# Patient Record
Sex: Female | Born: 1980
Health system: Southern US, Community
[De-identification: ages and names within clinical notes are randomized; demographics above are authoritative.]

## PROBLEM LIST (undated history)

## (undated) DIAGNOSIS — E785 Hyperlipidemia, unspecified: Secondary | ICD-10-CM

## (undated) DIAGNOSIS — E7800 Pure hypercholesterolemia, unspecified: Secondary | ICD-10-CM

## (undated) DIAGNOSIS — K5790 Diverticulosis of intestine, part unspecified, without perforation or abscess without bleeding: Secondary | ICD-10-CM

## (undated) DIAGNOSIS — E282 Polycystic ovarian syndrome: Secondary | ICD-10-CM

## (undated) DIAGNOSIS — E7889 Other lipoprotein metabolism disorders: Secondary | ICD-10-CM

## (undated) DIAGNOSIS — R0789 Other chest pain: Secondary | ICD-10-CM

## (undated) DIAGNOSIS — E669 Obesity, unspecified: Secondary | ICD-10-CM

## (undated) DIAGNOSIS — K7460 Unspecified cirrhosis of liver: Secondary | ICD-10-CM

## (undated) DIAGNOSIS — Z9889 Other specified postprocedural states: Secondary | ICD-10-CM

## (undated) DIAGNOSIS — F4310 Post-traumatic stress disorder, unspecified: Secondary | ICD-10-CM

## (undated) DIAGNOSIS — B182 Chronic viral hepatitis C: Secondary | ICD-10-CM

## (undated) DIAGNOSIS — B1920 Unspecified viral hepatitis C without hepatic coma: Secondary | ICD-10-CM

## (undated) DIAGNOSIS — E8889 Other specified metabolic disorders: Secondary | ICD-10-CM

## (undated) DIAGNOSIS — K219 Gastro-esophageal reflux disease without esophagitis: Secondary | ICD-10-CM

## (undated) HISTORY — PX: TONSILLECTOMY: SUR1361

## (undated) HISTORY — PX: BACK SURGERY: SHX140

## (undated) HISTORY — DX: Other chest pain: R07.89

## (undated) HISTORY — DX: Polycystic ovarian syndrome: E28.2

## (undated) HISTORY — DX: Post-traumatic stress disorder, unspecified: F43.10

## (undated) HISTORY — DX: Other specified metabolic disorders: E88.89

## (undated) HISTORY — DX: Unspecified viral hepatitis C without hepatic coma: B19.20

## (undated) HISTORY — DX: Unspecified cirrhosis of liver: K74.60

## (undated) HISTORY — DX: Chronic viral hepatitis C: B18.2

## (undated) HISTORY — DX: Obesity, unspecified: E66.9

## (undated) HISTORY — PX: HAND SURGERY: SHX662

## (undated) HISTORY — PX: FRACTURE SURGERY: SHX138

## (undated) HISTORY — PX: KNEE ARTHROSCOPY: SHX127

## (undated) HISTORY — PX: LIVER BIOPSY: SHX301

## (undated) HISTORY — DX: Other lipoprotein metabolism disorders: E78.89

## (undated) HISTORY — DX: Hyperlipidemia, unspecified: E78.5

---
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
## 2006-05-10 ENCOUNTER — Emergency Department (HOSPITAL_COMMUNITY): Admission: EM | Admit: 2006-05-10 | Discharge: 2006-05-10 | Payer: Self-pay | Admitting: Emergency Medicine

## 2006-08-11 ENCOUNTER — Emergency Department (HOSPITAL_COMMUNITY): Admission: EM | Admit: 2006-08-11 | Discharge: 2006-08-11 | Payer: Self-pay | Admitting: Emergency Medicine

## 2007-03-03 ENCOUNTER — Encounter: Payer: Self-pay | Admitting: Neurology

## 2007-03-03 ENCOUNTER — Ambulatory Visit: Payer: Self-pay | Admitting: Psychiatry

## 2007-03-03 ENCOUNTER — Inpatient Hospital Stay (HOSPITAL_COMMUNITY): Admission: AD | Admit: 2007-03-03 | Discharge: 2007-03-06 | Payer: Self-pay | Admitting: *Deleted

## 2007-03-03 IMAGING — CR DG CHEST 1V PORT
1 series · 1 of 1 positions shown · non-contrast
Comparison: none

CLINICAL DATA: Status epilepticus and cocaine abuse.
 CHEST PORTABLE - 1 VIEW ? [DATE]:

[view not recorded]
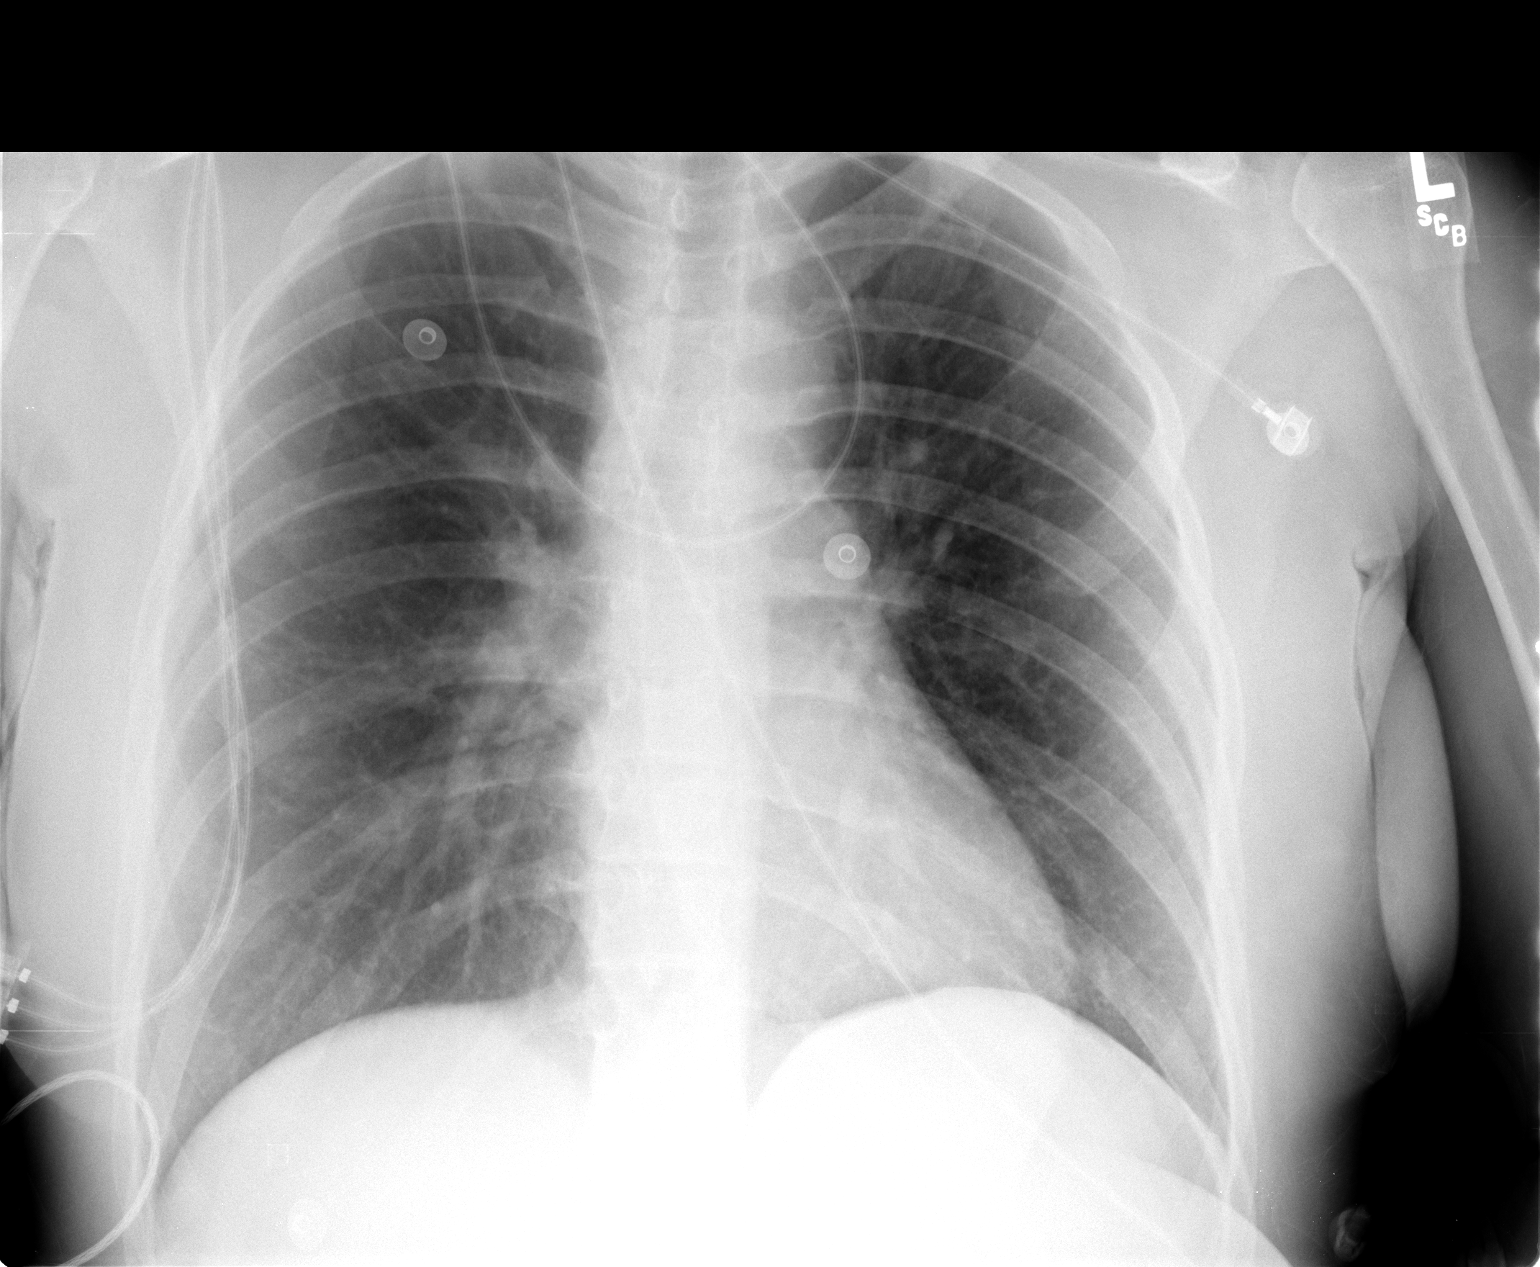

[1 of 1 positions shown; findings below may reference images not displayed]

FINDINGS: The heart and mediastinal contours are within normal limits.  Both lungs are well expanded and clear.  Negative for pneumothorax, focal air space opacity, effusion, or edema.
IMPRESSION: No evidence of acute cardiopulmonary disease.

## 2007-03-03 IMAGING — CT CT HEAD W/O CM
1 series · 16 of 30 positions shown, 20 images · IV contrast (agent unspecified)
Comparison: none

CLINICAL DATA: Seizure. 
 HEAD CT WITHOUT CONTRAST:
TECHNIQUE: Contiguous axial images were obtained from the base of the skull through the vertex according to standard protocol without contrast.

[Series 2: headseq 4.8 h45s · axial · 0.43mm/px · z∈[-162,-34]mm · 16 of 30 slices shown, 20 images]
[im 2/30  brain]
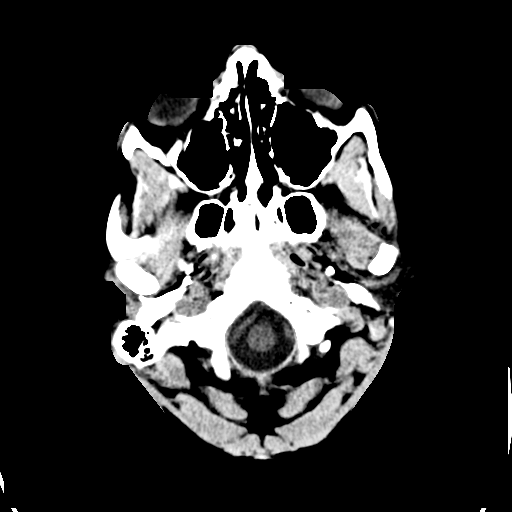
[im 2/30  bone]
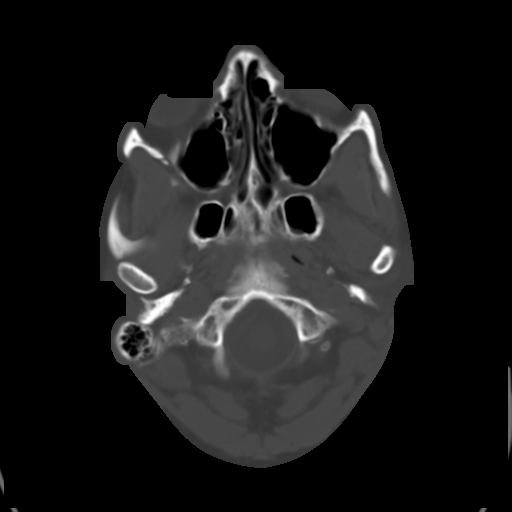
[im 4/30  brain]
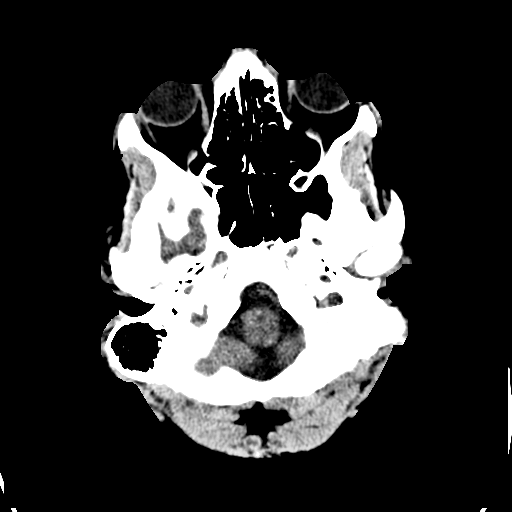
[im 6/30  brain]
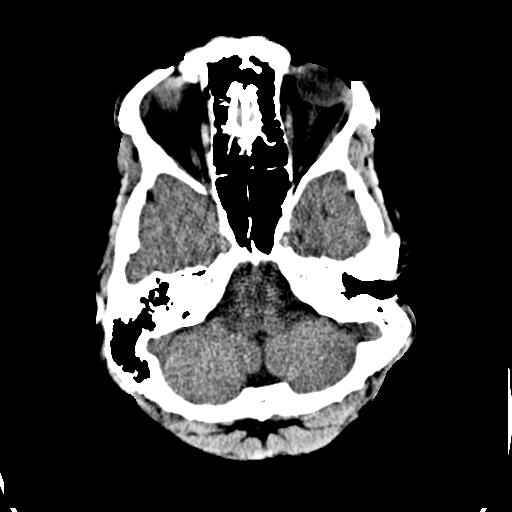
[im 8/30  brain]
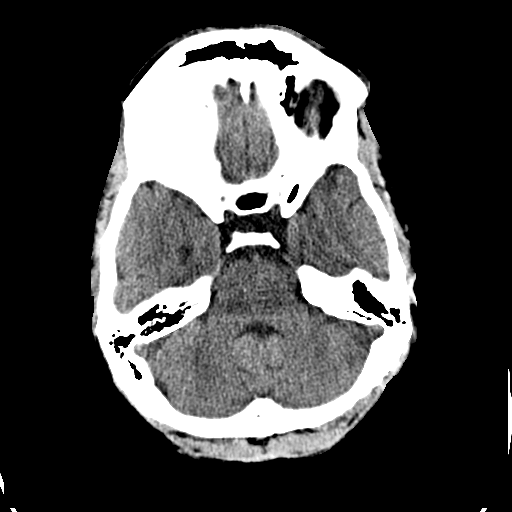
[im 9/30  brain]
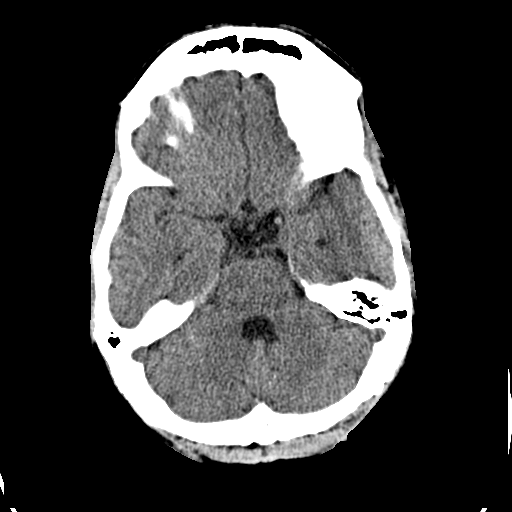
[im 9/30  bone]
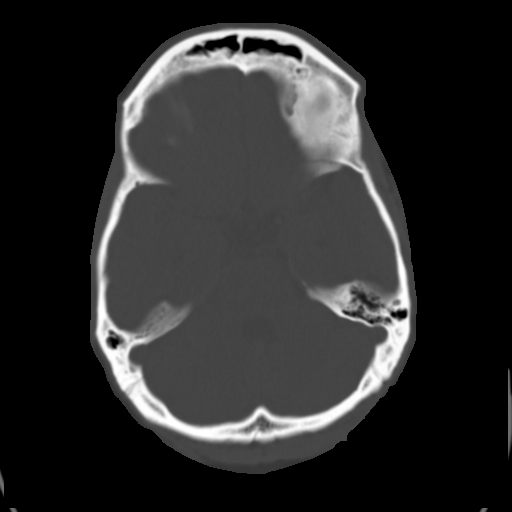
[im 11/30  brain]
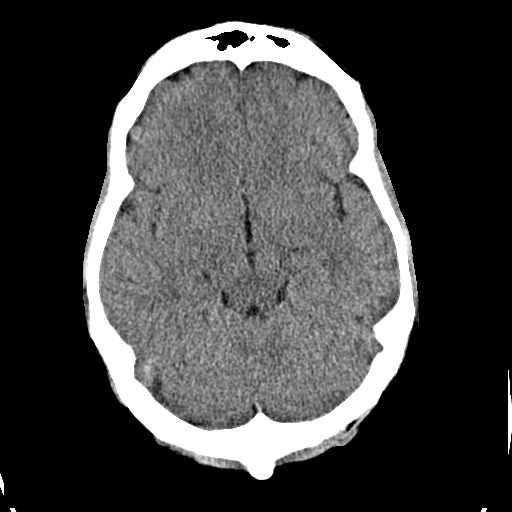
[im 13/30  brain]
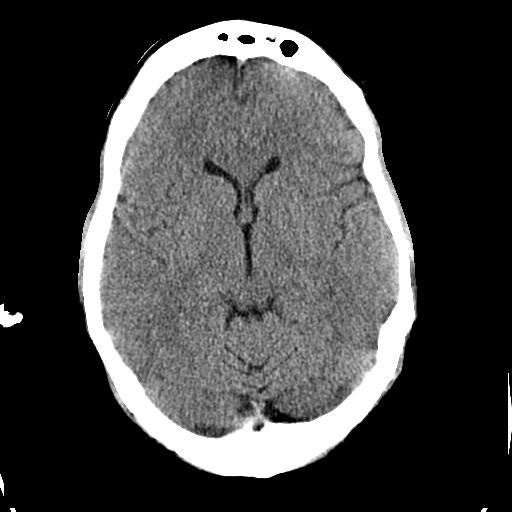
[im 15/30  brain]
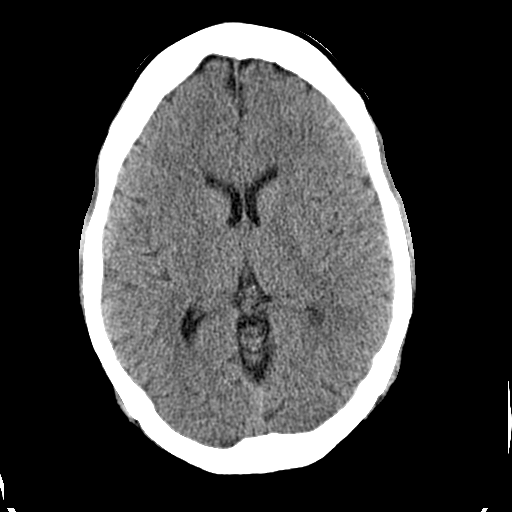
[im 16/30  brain]
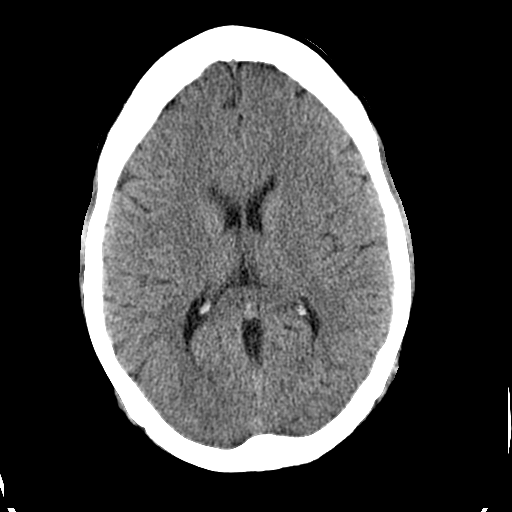
[im 16/30  bone]
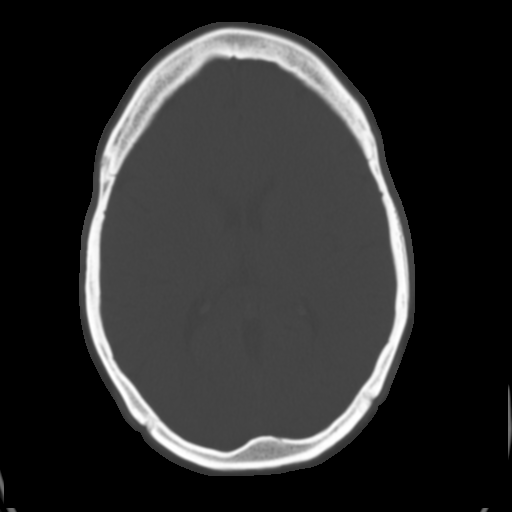
[im 18/30  brain]
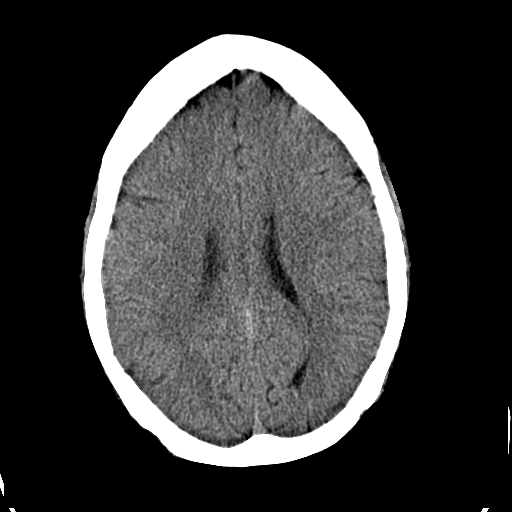
[im 20/30  brain]
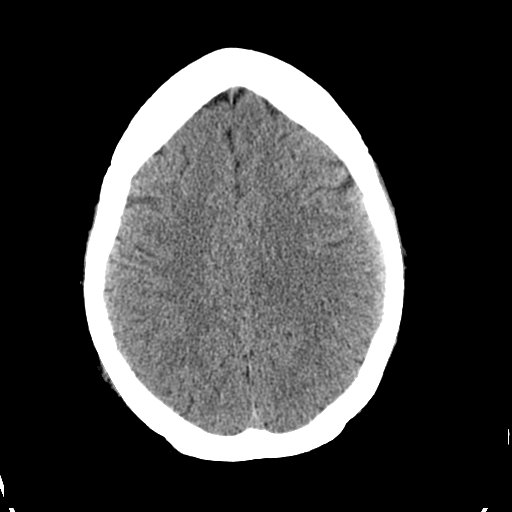
[im 22/30  brain]
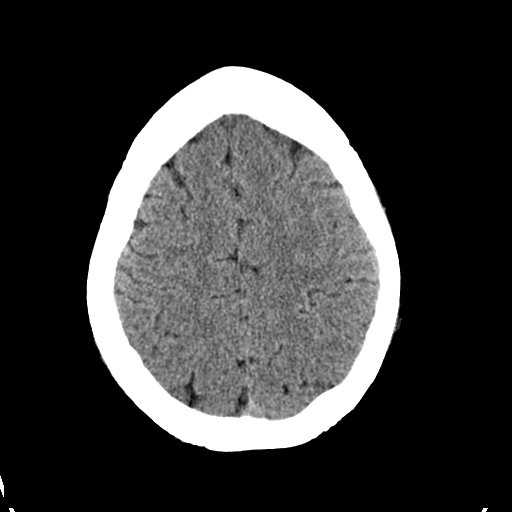
[im 23/30  brain]
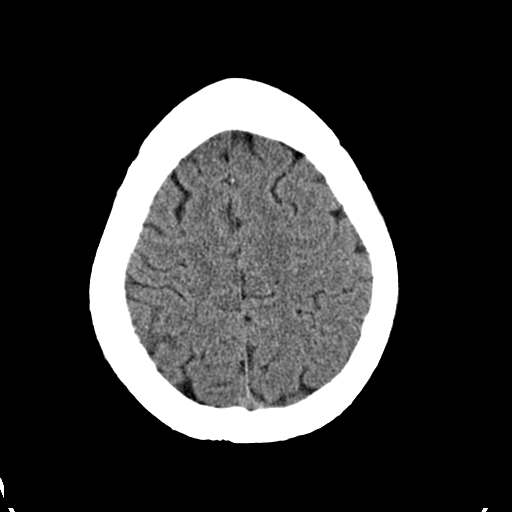
[im 23/30  bone]
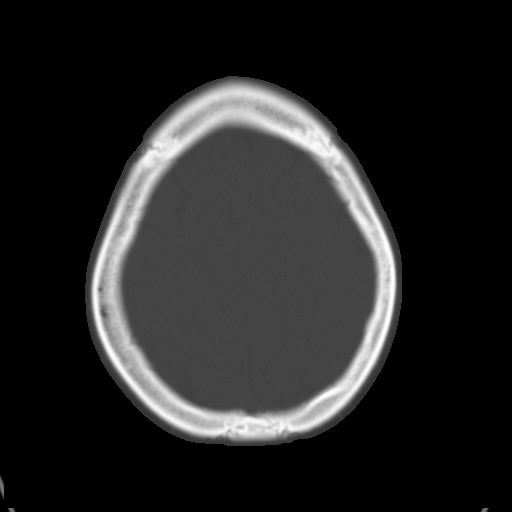
[im 25/30  brain]
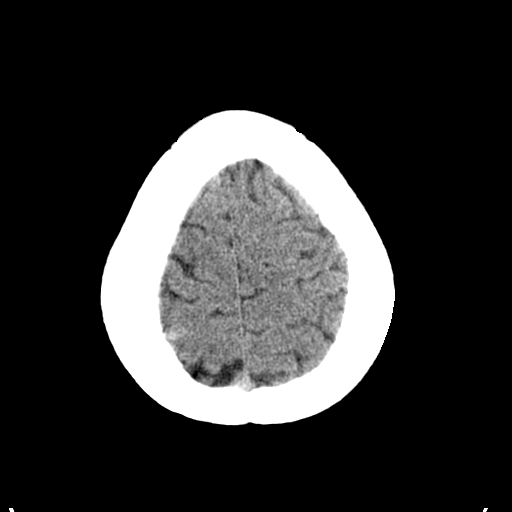
[im 27/30  brain]
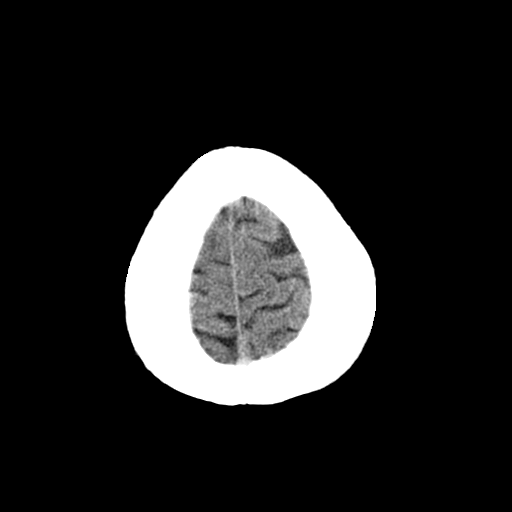
[im 29/30  brain]
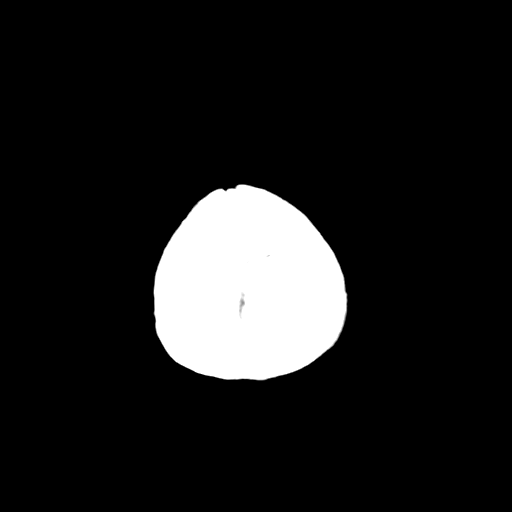

[16 of 30 positions shown; findings below may reference images not displayed]

FINDINGS: Streak artifact is seen across the brain stem. Allowing for this, there is no mass effect, midline shift, or acute intracranial hemorrhage. The ventricular system and extraaxial space are within normal limits. The visualized paranasal sinuses are clear. The cranium is intact.
IMPRESSION: No intracranial pathology.

## 2007-03-04 ENCOUNTER — Ambulatory Visit: Payer: Self-pay | Admitting: Infectious Diseases

## 2007-06-05 ENCOUNTER — Emergency Department (HOSPITAL_COMMUNITY): Admission: EM | Admit: 2007-06-05 | Discharge: 2007-06-05 | Payer: Self-pay | Admitting: Emergency Medicine

## 2007-12-14 ENCOUNTER — Emergency Department (HOSPITAL_COMMUNITY): Admission: EM | Admit: 2007-12-14 | Discharge: 2007-12-14 | Payer: Self-pay | Admitting: Emergency Medicine

## 2008-02-24 ENCOUNTER — Emergency Department (HOSPITAL_COMMUNITY): Admission: EM | Admit: 2008-02-24 | Discharge: 2008-02-24 | Payer: Self-pay | Admitting: Emergency Medicine
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
## 2008-03-07 ENCOUNTER — Emergency Department (HOSPITAL_COMMUNITY): Admission: EM | Admit: 2008-03-07 | Discharge: 2008-03-07 | Payer: Self-pay | Admitting: Emergency Medicine
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
## 2008-06-18 ENCOUNTER — Ambulatory Visit (HOSPITAL_BASED_OUTPATIENT_CLINIC_OR_DEPARTMENT_OTHER): Admission: RE | Admit: 2008-06-18 | Discharge: 2008-06-18 | Payer: Self-pay | Admitting: General Surgery

## 2008-06-18 ENCOUNTER — Encounter (INDEPENDENT_AMBULATORY_CARE_PROVIDER_SITE_OTHER): Payer: Self-pay | Admitting: General Surgery

## 2008-06-20 ENCOUNTER — Emergency Department (HOSPITAL_COMMUNITY): Admission: EM | Admit: 2008-06-20 | Discharge: 2008-06-21 | Payer: Self-pay | Admitting: Emergency Medicine

## 2010-06-06 LAB — BASIC METABOLIC PANEL
BUN: 12 mg/dL (ref 6–23)
Creatinine, Ser: 1.09 mg/dL (ref 0.4–1.2)
GFR calc non Af Amer: 60 mL/min — ABNORMAL LOW (ref >60)

## 2010-06-06 LAB — CBC
HCT: 43.5 % (ref 36.0–46.0)
Hemoglobin: 14.7 g/dL (ref 12.0–15.0)
MCV: 100 fL (ref 78.0–100.0)
Platelets: 235 10*3/uL (ref 150–400)
Platelets: 288 10*3/uL (ref 150–400)
RDW: 13 % (ref 11.5–15.5)
RDW: 13.3 % (ref 11.5–15.5)
WBC: 8 10*3/uL (ref 4.0–10.5)

## 2010-06-06 LAB — URINALYSIS, ROUTINE W REFLEX MICROSCOPIC
Bilirubin Urine: NEGATIVE
Ketones, ur: 15 mg/dL — AB
Nitrite: NEGATIVE
Specific Gravity, Urine: >1.046 — ABNORMAL HIGH (ref 1.005–1.030)
Urobilinogen, UA: 1 mg/dL (ref 0.0–1.0)
pH: 6 (ref 5.0–8.0)

## 2010-06-06 LAB — DIFFERENTIAL
Basophils Absolute: 0 10*3/uL (ref 0.0–0.1)
Basophils Absolute: 0.1 10*3/uL (ref 0.0–0.1)
Eosinophils Absolute: 0.4 10*3/uL (ref 0.0–0.7)
Eosinophils Relative: 7 % — ABNORMAL HIGH (ref 0–5)
Lymphocytes Relative: 24 % (ref 12–46)
Lymphocytes Relative: 25 % (ref 12–46)
Lymphs Abs: 2 10*3/uL (ref 0.7–4.0)
Monocytes Absolute: 0.7 10*3/uL (ref 0.1–1.0)
Neutro Abs: 4.4 10*3/uL (ref 1.7–7.7)
Neutrophils Relative %: 55 % (ref 43–77)

## 2010-06-06 LAB — WET PREP, GENITAL
Trich, Wet Prep: NONE SEEN
Yeast Wet Prep HPF POC: NONE SEEN

## 2010-06-06 LAB — HCG, QUANTITATIVE, PREGNANCY

## 2010-06-06 LAB — POCT PREGNANCY, URINE

## 2010-07-05 NOTE — Consult Note (Signed)
NAMEJASHLEY, Megan [name]             ACCOUNT NO.:  [id number]   MEDICAL RECORD NO.:  [id number]          PATIENT TYPE:  INP   LOCATION:  3020                         FACILITY:  [facility]   PHYSICIAN:  Antonietta Breach, M.D.  DATE OF BIRTH:  1980-03-14   DATE OF CONSULTATION:  03/06/2007  DATE OF DISCHARGE:  03/06/2007                                 CONSULTATION   Megan [name] is alert and oriented today.  She is cooperative with staff and  bedside healthcare.  She is socially appropriate.  She has no thoughts  of harming herself or others.  She has no hallucinations or delusions.   She has intact constructive future plans and interests.  Her energy is  within normal limits.  She is not having any racing thoughts are  decreased need for sleep.   Her CBC and liver function panel are unremarkable.   MENTAL STATUS EXAM:  As above.  Megan [name] is alert.  Her judgment is  intact.  Her insight is intact.  Thought process is logical, coherent,  goal-directed.  No looseness of associations.  Speech is within normal  limits.  She has good eye contact.  She is socially appropriate.   The patient confirms a past history of bipolar disorder.  She has had  episodes lasting over 4 days that have involved decreased need for  sleep, increased energy, high mood and grandiosity.   ASSESSMENT:  AXIS I:  296.80 bipolar disorder not otherwise specified,  stable.  Rule out somatoform disorder not otherwise specified.  Polysubstance dependence.   The undersigned provided ego supportive psychotherapy and education.  The indications, alternatives and adverse effects of Depakote were  discussed with the patient, including the risks of lethal blood and  liver effects.  The patient understands, would like to continue with  Depakote as her primary mood stabilizer.   RECOMMENDATIONS:  1. Depakote 1000 mg extended release one p.o. at bedtime, dispense #7      with one refill.  The patient will have follow-up with  her      psychiatrist in less than 2 weeks.  She also had will have follow-      up with Neurology.  2. Check a CBC and liver function panel within the week.  3. Follow-up with her alcohol drug services and 12-step groups.   The patient agrees to call emergency services immediately for thoughts  of harming herself, thoughts of harming others, distress, racing  thoughts.      Antonietta Breach, M.D.  Electronically Signed    JW/MEDQ  D:  03/07/2007  T:  03/07/2007  Job:  [id number]

## 2010-07-05 NOTE — Op Note (Signed)
[name], [name]                ACCOUNT NO.:  [id number]   MEDICAL RECORD NO.:  [id number]          PATIENT TYPE:  AMB   LOCATION:  DSC                          FACILITY:  [facility]   PHYSICIAN:  Gabrielle Dare. Janee Morn, M.D.DATE OF BIRTH:  07-08-1980   DATE OF PROCEDURE:  06/18/2008  DATE OF DISCHARGE:                               OPERATIVE REPORT   PREOPERATIVE DIAGNOSIS:  Mass left upper inner thigh.   POSTOPERATIVE DIAGNOSIS:  Mass left upper inner thigh.   PROCEDURE:  Excision of mass left upper inner thigh 2 cm.   SURGEON:  Gabrielle Dare. Janee Morn, MD   ANESTHESIA:  General with laryngeal mask airway   HISTORY OF PRESENT ILLNESS:  [name] is a 30 year old white female who  I evaluated in the office for an increasingly symptomatic mass on her  left upper inner thigh and becoming more painful especially with certain  types of clothing.  She presents today for elective excision.   PROCEDURE IN DETAIL:  Informed consent was obtained.  The patient was  identified and her site was marked.  She received intravenous  antibiotics.  She was brought to the operating room.  General anesthesia  with laryngeal mask airway was administered by the anesthesia staff.  Her left upper inner thigh was prepped and draped in a sterile fashion.  Marcaine 0.25% with epinephrine was injected around the palpable mass.  A transverse incision was made.  Subcutaneous tissues were dissected  down revealing this to be a fatty tumor.  It was circumferentially  dissected and removed and sent to pathology.  Meticulous hemostasis was  obtained in the wound.  The wound was irrigated.  There was no bleeding  in the wound.  The skin was then closed with a running 4-0 Monocryl  subcuticular stitch.  Please note that due to the redundancy of the  skin, an ellipse of skin was taken out along with the mass.  Dermabond  was placed.  Sponge, needle and instrument counts were all correct.  The  patient tolerated the procedure  well without apparent complication and  was taken to recovery room in stable condition.      Gabrielle Dare Janee Morn, M.D.  Electronically Signed     BET/MEDQ  D:  06/18/2008  T:  06/18/2008  Job:  [id number]   cc:   Aida Puffer
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
## 2010-07-05 NOTE — Discharge Summary (Signed)
Megan [name], Megan [name]             ACCOUNT NO.:  [id number]   MEDICAL RECORD NO.:  [id number]          PATIENT TYPE:  INP   LOCATION:  3020                         FACILITY:  [facility]   PHYSICIAN:  Bevelyn Buckles. Champey, M.D.DATE OF BIRTH:  Dec 03, 1980   DATE OF ADMISSION:  03/03/2007  DATE OF DISCHARGE:  03/06/2007                               DISCHARGE SUMMARY   REASON FOR ADMISSION:  Spells.   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   SECONDARY DIAGNOSES:  1. Bipolar disorder.  2. Hiatal hernia.  3. Methicillin-resistant Staphylococcus aureus skin infection.   DISCHARGE MEDICATIONS:  Depakote ER 1000 mg p.o. nightly.   HOSPITAL COURSE:  [name] is a 30 year old Caucasian female  who was  admitted  on March 03, 2007.  Please see history and physical as per  Dr. Scherrie November  on March 03, 2007.  The patient was admitted for  monitoring of spells that were thought to be non-epileptic in nature;  however, question was that it could have been electrographic seizures or  cocaine-induced seizures, as the patient had a positive urine drug  screen with cocaine on admission.  The patient was admitted for  continuous EEG monitoring requiring capture spells.  The patient also  had a questionable MRSA skin infection and was continued on vancomycin  as per medicine consult.  Psychiatry and Infectious Disease were also  consulted secondary to patient's noncompliant bipolar disorder and  questionable MRSA infection.  The patient subsequently was monitored  with continuous EEG for two days where she had several events on day 2.  These events, please see description on EEG dictation.  These events  were described as non-epileptic in character with no evidence of  epileptiform activity.  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  Infectious Disease said there is no need for vancomycin given the skin  infection  and vancomycin was stopped.  The patient was taken off  monitoring and was ready for discharge on March 06, 2007 at which time  she was stable.  Psychiatry initially thought possibly inpatient  psychiatry admission would be needed; however, on March 06, 2007, Dr.  Jeanie Sewer thought the patient was stable and judgment was within normal  limits, and she needs to follow up closely with her psychiatrist related  to her bipolar disorder and somatoform disorder.  She was discharged on  Depakote and was told to follow up with her psychiatrist.  The patient  was discharged home to follow up with her psychiatrist next week and on  discharge medication Depakote ER 1000 mg nightly.  She was discharged  home in stable condition to her mother and boyfriend.  She was told to  call with any questions or concerns.      Bevelyn Buckles. Nash Shearer, M.D.  Electronically Signed     DRC/MEDQ  D:  03/06/2007  T:  03/06/2007  Job:  [id number]

## 2010-07-05 NOTE — Procedures (Signed)
EEG NUMBER:  [id number]   HISTORY:  This is a 30 year old with seizure-like episodes who is having  continuous EEG to evaluate for epileptic versus nonepileptic events.  This recording occurred from March 03, 2006, to March 05, 2006.  Day  #1 of recording, there are no push button events noted.  The background  activity is fairly symmetric mostly comprised of alpha and theta range  activity at 10-30 microvolts.  There was occasional EMG movement and  electrode artifact noticed throughout this entire day's recording. There  is no definitive epileptiform activity.   Day #2 of recording, there are a total of five areas of push button  events.  The first push button event occurs from 6:55 p.m. to 6:56 p.m.  There are 19 push buttons during this episode.  The patient is  clinically lying on her right side and then starts with some vigorous  irregular jerking and twitching in her body. Electrographically, there  is no definitive epileptiform activity.  There does seem to some 60 Hz  artifact in the background that does obscures the background slightly.  The second push button event occurs at 7:43 p.m.  There are two push  button events associated with this event.  The patient is lying down in  bed. There is no definitive movement.  There is also no definitive  epileptiform activity noticed, and there is no background activity  noticed during this time frame. There is also 60 Hz artifact noticed  during this time frame.  The third and fourth push button events  occurred at 7:46 p.m. and 7:48 p.m. for a total of six push buttons  during this event.  The patient is lying down in bed, having slight  twitching, and her right hand is clenched. This is followed by vigorous  thrashing and irregular movements in her body and head. There is no  definitive change in the background, just a tremendous amount of EMG  movement artifact. There is 60 Hz artifact prior and post event with no  significant  change in the background other than EMG movement artifact.  The last push button event occurs at 9:26 p.m.  There are six push  buttons associated with this event.  The patient is lying down.  Her  head is to the left.  The patient opens her eyes, turns her head to the  right, and back and forth a few times; then the patient starts having  body twitching and thrashing with irregular movements in her entire body  and arching her back.  Throughout this event, there is no definitive  epileptiform activity, just a tremendous amount of EMG movement  artifact.  There is also 60 Hz artifact noticed in the background as  well.  The background of this recording is fairly symmetric, mostly  comprised of mixed frequency activity at 10-40 microvolts.  The patient  does seem to disconnect the machine at 6:08 p.m. prior to all her push  button events as she goes to the bathroom, and after this time is when  160 Hz artifact is seen in the background. There is also a tremendous  amount of EMG movement and electrode artifact noticed throughout the  recording as well.  Throughout this entire tracing, there is no  definitive epileptiform activity noticed.   IMPRESSION:  This continuous video EEG monitoring had several events on  day #2 of recording as described above.  There is no definitive  epileptiform activity noticed during this time frame.  These  events are  classified as nonepileptic in character.  Clinical correlation is  advised.      Bevelyn Buckles. Nash Shearer, M.D.  Electronically Signed     ZOX:WRUE  D:  03/06/2007 12:29:44  T:  03/06/2007 13:18:33  Job #:  [id number]
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
## 2010-07-05 NOTE — Consult Note (Signed)
Megan, [name] NO.:  [id number]   MEDICAL RECORD NO.:  [id number]          PATIENT TYPE:  EMS   LOCATION:  ED                           FACILITY:  [facility]   PHYSICIAN:  Altha Harm, MDDATE OF BIRTH:  Nov 20, 1980   DATE OF CONSULTATION:  03/03/2007  DATE OF DISCHARGE:                                 CONSULTATION   CHIEF COMPLAINT:  Seizure disorder.   HISTORY OF PRESENT ILLNESS:  This is a 30 year old Caucasian female who  was recently a patient at [facility] and left against medical  advice because her boyfriend could not stay with her in the ICU room.  The patient now presents to [facility] Emergency Room with  continued seizure activity.  The patient is unable to give me any  information regarding her seizures and her family members are not  available to give any further information.  However, the patient states  that she has had seizures since she has been in the emergency room.  They were not witnessed by any hospital personnel.   In addition to this, patient had an I&D of a small lesion on her  buttocks while over at [facility] and, according to her, the  aspirate was sent for culture.  The patient stated that she was being  treated for methicillin resistant Staphylococcus aureus.  However, the  I&D was done only less than 24 hours ago, thus cultures are not  available at this time.   The patient states that she has no other medical problems and she is not  under the care of any other physicians.  On the emergency room record,  it is listed the patient has bipolar disorder in her past medical  history, however, the patient denies this to me.   REVIEW OF SYSTEMS:  Twelve systems reviewed.  All systems were negative  except as noted in the HPI.   Studies in the emergency room show the following.  Sodium 140, potassium  3.3, chloride 107, carbon dioxide 17, BUN 4, creatinine 1.01.  White  blood cell count 9.5,  hemoglobin 13.5, hematocrit 39, platelet count  305.  Alcohol level less than 5.  A urinalysis was done which shows no  laparoscopic elements consistent with a urinary tract infection.   PHYSICAL EXAMINATION:  VITAL SIGNS:  Temperature orally is 97.4,  rectally 99.1, blood pressure 116/44, heart rate 111, respiratory rate  14, O2 saturation 97% on room air.  GENERAL APPEARANCE:  The patient is laying in bed, alert and oriented  x3.  HEENT:  She is normocephalic and atraumatic.  Pupils are equal, round,  reactive to light and accommodation.  Extraocular movements are intact.  Tympanic membranes are translucent bilaterally.  Oropharynx is moist.  No exudate, erythema or lesions noted.  NECK:  Trachea is midline.  No masses, no thyromegaly, no JVD, no  carotid bruit.  RESPIRATORY:  Patient has a normal respiratory effort, equal excursion  bilaterally.  No rales or rhonchi noted.  CARDIOVASCULAR:  She has tachycardia, normal S1 and S2, no murmurs,  rubs, or gallops are noted.  PMI is  not displaced.  No heaves or thrills  on palpation.  ABDOMEN:  Abdomen is soft, obese, nontender, nondistended, no masses, no  hepatosplenomegaly noted.  LYMPH NODES:  She has no cervical, axillary or inguinal lymphadenopathy.  SKIN:  The patient has an area of post incision drainage with small  incision on the left buttocks almost at the cleft.  No exudate is  expressed from the incision at this time.  The patient also has some  small erythematous papular lesions diffusely over the skin.  NEUROLOGIC:  She has no focal neurologic deficits.  Cranial nerves II-  XII grossly intact.  DTRs are 2+ bilaterally in the upper and lower  extremities.  PSYCHIATRIC:  She is alert and oriented x3.  She has normal cognition,  insight appears to be somewhat compromised.  Good recent and remote  recall except for the periods when she has had seizures.   ASSESSMENT/PLAN:  This is a patient who presents primarily with a   complaint of seizure disorder.  New onset seizures.  The patient will  need neurological work-up.  Given the fact that this is primarily the  patient's problem, the patient should probably be admitted to the  neurology service.  Secondarily, her minor problem is she is being  treated for possible methicillin resistant Staphylococcus aureus.  The  investigation has already been completed with cultures pending at  [facility].  The patient should continue on vancomycin here until  the culture and sensitivities have been received and the therapy can be  narrowed.  If, in fact, there are no cultures at [facility], then  an infectious disease consult would be appropriate to  help guide type  and duration of therapy.  At this time, the patient does not require  admission to the medicine service and should be admitted to the  neurology service.  I spoke with Dr. Vickey Huger, who will see the patient  and will admit to the neurology service.      Altha Harm, MD  Electronically Signed     MAM/MEDQ  D:  03/03/2007  T:  03/03/2007  Job:  [id number]

## 2010-11-10 LAB — PHENYTOIN LEVEL, TOTAL
Phenytoin Lvl: 19.5
Phenytoin Lvl: 21 — ABNORMAL HIGH

## 2010-11-10 LAB — CBC
HCT: 39.7
Hemoglobin: 13.4
Hemoglobin: 13.5
MCHC: 33.7
MCHC: 34.2
MCHC: 34.5
MCV: 98.7
Platelets: 231
RBC: 3.95
RBC: 3.95
RDW: 13.7
RDW: 14
RDW: 14.1
WBC: 7.6

## 2010-11-10 LAB — URINALYSIS, ROUTINE W REFLEX MICROSCOPIC
Bilirubin Urine: NEGATIVE
Glucose, UA: NEGATIVE
Hgb urine dipstick: NEGATIVE
pH: 6.5

## 2010-11-10 LAB — BASIC METABOLIC PANEL
CO2: 17 — ABNORMAL LOW
Chloride: 107
Creatinine, Ser: 1.01
GFR calc Af Amer: >60
Glucose, Bld: 87
Sodium: 140

## 2010-11-10 LAB — HEPATIC FUNCTION PANEL
Albumin: 3 — ABNORMAL LOW
Indirect Bilirubin: 0.3
Total Bilirubin: 0.4
Total Protein: 6

## 2010-11-10 LAB — CULTURE, BLOOD (ROUTINE X 2)
Culture: NO GROWTH
Culture: NO GROWTH

## 2010-11-10 LAB — PHENYTOIN LEVEL, FREE AND TOTAL: Phenytoin Bound: 18.4

## 2010-11-10 LAB — RAPID URINE DRUG SCREEN, HOSP PERFORMED
Amphetamines: NOT DETECTED
Benzodiazepines: POSITIVE — AB
Cocaine: POSITIVE — AB
Opiates: NOT DETECTED

## 2010-11-10 LAB — COMPREHENSIVE METABOLIC PANEL
ALT: 9
AST: 15
Albumin: 2.7 — ABNORMAL LOW
Calcium: 8.1 — ABNORMAL LOW
GFR calc Af Amer: >60
Sodium: 140
Total Protein: 5.3 — ABNORMAL LOW

## 2010-11-10 LAB — DIFFERENTIAL
Basophils Relative: 1
Eosinophils Absolute: 0.2
Monocytes Absolute: 1
Monocytes Relative: 10

## 2010-11-10 LAB — URINE CULTURE

## 2010-11-10 LAB — URINE MICROSCOPIC-ADD ON

## 2010-11-10 LAB — MAGNESIUM: Magnesium: 2.3

## 2012-09-05 ENCOUNTER — Encounter (HOSPITAL_COMMUNITY): Payer: Self-pay | Admitting: Emergency Medicine

## 2012-09-05 ENCOUNTER — Emergency Department (HOSPITAL_COMMUNITY)
Admission: EM | Admit: 2012-09-05 | Discharge: 2012-09-06 | Disposition: A | Discharge status: Home / Self Care | Payer: Medicaid Other | Attending: Emergency Medicine | Admitting: Emergency Medicine

## 2012-09-05 DIAGNOSIS — Z3202 Encounter for pregnancy test, result negative: Secondary | ICD-10-CM | POA: Insufficient documentation

## 2012-09-05 DIAGNOSIS — F1010 Alcohol abuse, uncomplicated: Secondary | ICD-10-CM

## 2012-09-05 DIAGNOSIS — F17200 Nicotine dependence, unspecified, uncomplicated: Secondary | ICD-10-CM | POA: Insufficient documentation

## 2012-09-05 DIAGNOSIS — R55 Syncope and collapse: Secondary | ICD-10-CM | POA: Insufficient documentation

## 2012-09-05 DIAGNOSIS — Z8639 Personal history of other endocrine, nutritional and metabolic disease: Secondary | ICD-10-CM | POA: Insufficient documentation

## 2012-09-05 DIAGNOSIS — Y9389 Activity, other specified: Secondary | ICD-10-CM | POA: Insufficient documentation

## 2012-09-05 DIAGNOSIS — R1110 Vomiting, unspecified: Secondary | ICD-10-CM | POA: Insufficient documentation

## 2012-09-05 DIAGNOSIS — T401X1A Poisoning by heroin, accidental (unintentional), initial encounter: Secondary | ICD-10-CM

## 2012-09-05 DIAGNOSIS — Z880 Allergy status to penicillin: Secondary | ICD-10-CM | POA: Insufficient documentation

## 2012-09-05 DIAGNOSIS — Z79899 Other long term (current) drug therapy: Secondary | ICD-10-CM | POA: Insufficient documentation

## 2012-09-05 DIAGNOSIS — E669 Obesity, unspecified: Secondary | ICD-10-CM | POA: Insufficient documentation

## 2012-09-05 DIAGNOSIS — T401X4A Poisoning by heroin, undetermined, initial encounter: Secondary | ICD-10-CM | POA: Insufficient documentation

## 2012-09-05 DIAGNOSIS — Z862 Personal history of diseases of the blood and blood-forming organs and certain disorders involving the immune mechanism: Secondary | ICD-10-CM | POA: Insufficient documentation

## 2012-09-05 DIAGNOSIS — Y929 Unspecified place or not applicable: Secondary | ICD-10-CM | POA: Insufficient documentation

## 2012-09-05 HISTORY — DX: Pure hypercholesterolemia, unspecified: E78.00

## 2012-09-05 LAB — RAPID URINE DRUG SCREEN, HOSP PERFORMED
Amphetamines: NOT DETECTED
Benzodiazepines: NOT DETECTED
Cocaine: POSITIVE — AB
Opiates: NOT DETECTED
Tetrahydrocannabinol: NOT DETECTED

## 2012-09-05 LAB — COMPREHENSIVE METABOLIC PANEL
ALT: 82 U/L — ABNORMAL HIGH (ref 0–35)
AST: 262 U/L — ABNORMAL HIGH (ref 0–37)
Alkaline Phosphatase: 179 U/L — ABNORMAL HIGH (ref 39–117)
CO2: 17 mEq/L — ABNORMAL LOW (ref 19–32)
Calcium: 8.6 mg/dL (ref 8.4–10.5)
GFR calc Af Amer: >90 mL/min (ref >90)
Glucose, Bld: 114 mg/dL — ABNORMAL HIGH (ref 70–99)
Potassium: 3.7 mEq/L (ref 3.5–5.1)
Sodium: 138 mEq/L (ref 135–145)
Total Protein: 7.7 g/dL (ref 6.0–8.3)

## 2012-09-05 LAB — CBC
HCT: 43.3 % (ref 36.0–46.0)
MCH: 36.9 pg — ABNORMAL HIGH (ref 26.0–34.0)
MCHC: 34.6 g/dL (ref 30.0–36.0)
MCV: 106.7 fL — ABNORMAL HIGH (ref 78.0–100.0)
Platelets: 234 10*3/uL (ref 150–400)
RDW: 15.5 % (ref 11.5–15.5)
WBC: 10.7 10*3/uL — ABNORMAL HIGH (ref 4.0–10.5)

## 2012-09-05 LAB — DIFFERENTIAL
Basophils Absolute: 0 10*3/uL (ref 0.0–0.1)
Eosinophils Absolute: 0.2 10*3/uL (ref 0.0–0.7)
Eosinophils Relative: 2 % (ref 0–5)
Lymphocytes Relative: 6 % — ABNORMAL LOW (ref 12–46)
Monocytes Absolute: 0.7 10*3/uL (ref 0.1–1.0)

## 2012-09-05 LAB — URINE MICROSCOPIC-ADD ON

## 2012-09-05 LAB — URINALYSIS, ROUTINE W REFLEX MICROSCOPIC
Glucose, UA: NEGATIVE mg/dL
Hgb urine dipstick: NEGATIVE
Leukocytes, UA: NEGATIVE
Protein, ur: 30 mg/dL — AB
Specific Gravity, Urine: 1.02 (ref 1.005–1.030)
pH: 5 (ref 5.0–8.0)

## 2012-09-05 MED ORDER — ONDANSETRON HCL 4 MG PO TABS: 4.0000 mg | ORAL_TABLET | Freq: Three times a day (TID) | ORAL | Status: DC | PRN

## 2012-09-05 MED ORDER — LORAZEPAM 1 MG PO TABS: 0.0000 mg | ORAL_TABLET | Freq: Two times a day (BID) | ORAL | Status: DC

## 2012-09-05 MED ORDER — ONDANSETRON HCL 4 MG/2ML IJ SOLN
4.0000 mg | Freq: Once | INTRAMUSCULAR | Status: AC
  Administered 2012-09-05: 4 mg via INTRAVENOUS
  Filled 2012-09-05: qty 2

## 2012-09-05 MED ORDER — LORAZEPAM 1 MG PO TABS: 0.0000 mg | ORAL_TABLET | Freq: Four times a day (QID) | ORAL | Status: DC

## 2012-09-05 MED ORDER — ADULT MULTIVITAMIN W/MINERALS CH: 1.0000 | ORAL_TABLET | Freq: Every day | ORAL | Status: DC

## 2012-09-05 MED ORDER — FOLIC ACID 1 MG PO TABS: 1.0000 mg | ORAL_TABLET | Freq: Every day | ORAL | Status: DC

## 2012-09-05 MED ORDER — PANTOPRAZOLE SODIUM 40 MG PO TBEC: 80.0000 mg | DELAYED_RELEASE_TABLET | Freq: Every day | ORAL | Status: DC

## 2012-09-05 MED ORDER — VITAMIN B-1 100 MG PO TABS: 100.0000 mg | ORAL_TABLET | Freq: Every day | ORAL | Status: DC

## 2012-09-05 MED ORDER — THIAMINE HCL 100 MG/ML IJ SOLN: 100.0000 mg | Freq: Every day | INTRAMUSCULAR | Status: DC

## 2012-09-05 MED ORDER — SODIUM CHLORIDE 0.9 % IV BOLUS (SEPSIS)
1000.0000 mL | Freq: Once | INTRAVENOUS | Status: AC
  Administered 2012-09-05: 1000 mL via INTRAVENOUS

## 2012-09-05 NOTE — ED Notes (Signed)
ZOX:[id number]<AV> Expected date:09/05/12<BR> Expected time: 7:41 PM<BR> Means of arrival:Ambulance<BR> Comments:<BR> 32 yo F Heroine use, unresponsive, narcan given alert and oriented now

## 2012-09-05 NOTE — ED Provider Notes (Signed)
History    CSN: [id number] Arrival date & time 09/05/12  1951  First MD Initiated Contact with Patient 09/05/12 2004     Chief Complaint  Patient presents with  . Drug Overdose   (Consider location/radiation/quality/duration/timing/severity/associated sxs/prior Treatment) Patient is a 32 y.o. female presenting with Overdose. The history is provided by the patient.  Drug Overdose This is a new problem. Pertinent negatives include no chest pain, no abdominal pain, no headaches and no shortness of breath.   patient overdosed on heroin. 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 She states she's a daily heavy drinker. She states that she drank 6 or 7 vodka drinks today and was upset with her boyfriend so she used heroin with her neighbors. She states she just is a little bit. She was later found unresponsive.  she woke up with Narcan. She does not remember this.patient states she is not used opiates in ears. She states she does want help with her drinking too. She's now actively vomiting. She states she and her boyfriend drink of vodka and by a 0.5 gallon about every other day. Past Medical History  Diagnosis Date  . Hypercholesteremia    Past Surgical History  Procedure Laterality Date  . Tonsillectomy    . Hand surgery     No family history on file. History  Substance Use Topics  . Smoking status: Current Every Day Smoker -- 0.50 packs/day for 15 years    Types: Cigarettes  . Smokeless tobacco: Never Used  . Alcohol Use: Yes     Comment: 0.5 gallon every 2-3 days   OB History   Grav Para Term Preterm Abortions TAB SAB Ect Mult Living                 Review of Systems  Constitutional: Negative for activity change and appetite change.  HENT: Negative for neck stiffness.   Eyes: Negative for pain.  Respiratory: Negative for chest tightness and shortness of breath.   Cardiovascular: Negative for chest pain and leg swelling.  Gastrointestinal:  Positive for vomiting. Negative for nausea, abdominal pain and diarrhea.  Genitourinary: Negative for flank pain.  Musculoskeletal: Negative for back pain.  Skin: Negative for rash.  Neurological: Positive for syncope. Negative for weakness, numbness and headaches.  Psychiatric/Behavioral: Negative for suicidal ideas and behavioral problems.    Allergies  Penicillins  Home Medications   Current Outpatient Rx  Name  Route  Sig  Dispense  Refill  . omeprazole (PRILOSEC) 40 MG capsule   Oral   Take 40 mg by mouth every morning.          BP 107/77  Pulse 102  Temp(Src) 97.7 F (36.5 C) (Oral)  Resp 16  SpO2 99% Physical Exam  Nursing note and vitals reviewed. Constitutional: She is oriented to person, place, and time. She appears well-developed and well-nourished.  Patient is obese  HENT:  Head: Normocephalic and atraumatic.  Eyes: EOM are normal. Pupils are equal, round, and reactive to light.  Neck: Normal range of motion. Neck supple.  Cardiovascular: Normal rate, regular rhythm and normal heart sounds.   No murmur heard. Pulmonary/Chest: Effort normal and breath sounds normal. No respiratory distress. She has no wheezes. She has no rales.  Abdominal: Soft. Bowel sounds are normal. She exhibits no distension. There is no tenderness. There is no rebound and no guarding.  Patient is actively vomiting  Musculoskeletal: Normal range of motion.  Neurological: She  is alert and oriented to person, place, and time. No cranial nerve deficit.  Skin: Skin is warm and dry.  Psychiatric: She has a normal mood and affect. Her speech is normal.    ED Course  Procedures (including critical care time) Labs Reviewed  COMPREHENSIVE METABOLIC PANEL - Abnormal; Notable for the following:    CO2 17 (*)    Glucose, Bld 114 (*)    Albumin 3.4 (*)    AST 262 (*)    ALT 82 (*)    Alkaline Phosphatase 179 (*)    All other components within normal limits  ETHANOL - Abnormal; Notable for  the following:    Alcohol, Ethyl (B) 217 (*)    All other components within normal limits  URINALYSIS, ROUTINE W REFLEX MICROSCOPIC - Abnormal; Notable for the following:    APPearance CLOUDY (*)    Protein, ur 30 (*)    All other components within normal limits  URINE RAPID DRUG SCREEN (HOSP PERFORMED) - Abnormal; Notable for the following:    Cocaine POSITIVE (*)    All other components within normal limits  CBC - Abnormal; Notable for the following:    WBC 10.7 (*)    MCV 106.7 (*)    MCH 36.9 (*)    All other components within normal limits  DIFFERENTIAL - Abnormal; Notable for the following:    Neutrophils Relative % 85 (*)    Neutro Abs 9.1 (*)    Lymphocytes Relative 6 (*)    All other components within normal limits  URINE MICROSCOPIC-ADD ON - Abnormal; Notable for the following:    Squamous Epithelial / LPF MANY (*)    Bacteria, UA FEW (*)    Casts HYALINE CASTS (*)    All other components within normal limits  LIPASE, BLOOD  PREGNANCY, URINE  CBC WITH DIFFERENTIAL   No results found. 1. Accidental heroin overdose, initial encounter   2. Alcohol abuse     MDM  Patient with accidental heroin overdose that required Narcan. She also is a heavy alcohol user and would like treatment. She has cocaine in the urine, but she now states that she taken it. She was initially vomiting and had a bicarbonate of 17. She's been given Zofran and fluids and will be rechecked. After that she may be medically cleared will be seen by the ACT team.  Juliet Rude. Rubin Payor, MD 09/05/12 2350

## 2012-09-05 NOTE — ED Notes (Signed)
Per EMS: Pt found unresponsive on [employer] bathroom floor by mother. Apneic and cyanotic on EMS arrival at scene. Previous heroin problems. EMS bagged her. 20 G Left AC. Pt given 2 Narcan intranasally. After 5 mins assisted ventilations, pt came around and admitted to IV heroin drug use. 12 Lead normal. VS stable.

## 2012-09-06 NOTE — ED Notes (Signed)
Patient refused lab draw. Ward RN made aware.

## 2012-09-06 NOTE — ED Notes (Signed)
Spoke with patient and Dr. Patient requesting to be discharged. Patient states she feels fine.

## 2012-12-26 ENCOUNTER — Other Ambulatory Visit: Payer: Self-pay

## 2013-10-10 DIAGNOSIS — N12 Tubulo-interstitial nephritis, not specified as acute or chronic: Secondary | ICD-10-CM

## 2013-10-10 DIAGNOSIS — Z720 Tobacco use: Secondary | ICD-10-CM | POA: Insufficient documentation

## 2013-10-10 HISTORY — DX: Tobacco use: Z72.0

## 2013-10-10 HISTORY — DX: Tubulo-interstitial nephritis, not specified as acute or chronic: N12

## 2013-11-12 ENCOUNTER — Emergency Department (HOSPITAL_COMMUNITY)
Admission: EM | Admit: 2013-11-12 | Discharge: 2013-11-12 | Disposition: A | Discharge status: Home / Self Care | Payer: Medicaid Other | Attending: Emergency Medicine | Admitting: Emergency Medicine

## 2013-11-12 ENCOUNTER — Emergency Department (HOSPITAL_COMMUNITY): Payer: Medicaid Other

## 2013-11-12 ENCOUNTER — Encounter (HOSPITAL_COMMUNITY): Payer: Self-pay | Admitting: Emergency Medicine

## 2013-11-12 DIAGNOSIS — K219 Gastro-esophageal reflux disease without esophagitis: Secondary | ICD-10-CM | POA: Insufficient documentation

## 2013-11-12 DIAGNOSIS — S51809A Unspecified open wound of unspecified forearm, initial encounter: Secondary | ICD-10-CM | POA: Insufficient documentation

## 2013-11-12 DIAGNOSIS — F17200 Nicotine dependence, unspecified, uncomplicated: Secondary | ICD-10-CM | POA: Insufficient documentation

## 2013-11-12 DIAGNOSIS — X789XXA Intentional self-harm by unspecified sharp object, initial encounter: Secondary | ICD-10-CM | POA: Diagnosis not present

## 2013-11-12 DIAGNOSIS — R1110 Vomiting, unspecified: Secondary | ICD-10-CM | POA: Diagnosis not present

## 2013-11-12 DIAGNOSIS — Z880 Allergy status to penicillin: Secondary | ICD-10-CM | POA: Insufficient documentation

## 2013-11-12 DIAGNOSIS — Z23 Encounter for immunization: Secondary | ICD-10-CM | POA: Diagnosis not present

## 2013-11-12 DIAGNOSIS — S31119A Laceration without foreign body of abdominal wall, unspecified quadrant without penetration into peritoneal cavity, initial encounter: Secondary | ICD-10-CM

## 2013-11-12 DIAGNOSIS — S31109A Unspecified open wound of abdominal wall, unspecified quadrant without penetration into peritoneal cavity, initial encounter: Secondary | ICD-10-CM | POA: Insufficient documentation

## 2013-11-12 HISTORY — DX: Gastro-esophageal reflux disease without esophagitis: K21.9

## 2013-11-12 LAB — COMPREHENSIVE METABOLIC PANEL
ALT: 22 U/L (ref 0–35)
AST: 44 U/L — ABNORMAL HIGH (ref 0–37)
Albumin: 3.5 g/dL (ref 3.5–5.2)
Alkaline Phosphatase: 112 U/L (ref 39–117)
Anion gap: 20 — ABNORMAL HIGH (ref 5–15)
BUN: 8 mg/dL (ref 6–23)
CALCIUM: 8.8 mg/dL (ref 8.4–10.5)
CO2: 15 meq/L — AB (ref 19–32)
Chloride: 104 mEq/L (ref 96–112)
Creatinine, Ser: 0.69 mg/dL (ref 0.50–1.10)
GLUCOSE: 78 mg/dL (ref 70–99)
Potassium: 3.9 mEq/L (ref 3.7–5.3)
Sodium: 139 mEq/L (ref 137–147)
Total Bilirubin: 0.4 mg/dL (ref 0.3–1.2)
Total Protein: 7.5 g/dL (ref 6.0–8.3)

## 2013-11-12 LAB — CBC
HEMATOCRIT: 37.8 % (ref 36.0–46.0)
HEMOGLOBIN: 13.2 g/dL (ref 12.0–15.0)
MCH: 36.1 pg — AB (ref 26.0–34.0)
MCHC: 34.9 g/dL (ref 30.0–36.0)
MCV: 103.3 fL — AB (ref 78.0–100.0)
Platelets: 324 10*3/uL (ref 150–400)
RBC: 3.66 MIL/uL — AB (ref 3.87–5.11)
RDW: 13.5 % (ref 11.5–15.5)
WBC: 8.5 10*3/uL (ref 4.0–10.5)

## 2013-11-12 MED ORDER — HYDROMORPHONE HCL 1 MG/ML IJ SOLN
1.0000 mg | Freq: Once | INTRAMUSCULAR | Status: AC
  Administered 2013-11-12: 1 mg via INTRAVENOUS
  Filled 2013-11-12: qty 1

## 2013-11-12 MED ORDER — TETANUS-DIPHTH-ACELL PERTUSSIS 5-2.5-18.5 LF-MCG/0.5 IM SUSP
INTRAMUSCULAR | Status: AC
  Filled 2013-11-12: qty 0.5

## 2013-11-12 MED ORDER — IOHEXOL 300 MG/ML  SOLN
100.0000 mL | Freq: Once | INTRAMUSCULAR | Status: AC | PRN
  Administered 2013-11-12: 100 mL via INTRAVENOUS

## 2013-11-12 MED ORDER — ONDANSETRON 4 MG PO TBDP
4.0000 mg | ORAL_TABLET | Freq: Once | ORAL | Status: AC
  Administered 2013-11-12: 4 mg via ORAL
  Filled 2013-11-12: qty 1

## 2013-11-12 MED ORDER — TETANUS-DIPHTH-ACELL PERTUSSIS 5-2.5-18.5 LF-MCG/0.5 IM SUSP
0.5000 mL | Freq: Once | INTRAMUSCULAR | Status: AC
  Administered 2013-11-12: 0.5 mL via INTRAMUSCULAR
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
## 2013-11-12 NOTE — Progress Notes (Signed)
Chaplain responded to trauma page. Chaplain spoke with patient and asked if she would like family called. Pt called her sister, Wynelle Cleveland on pt cell phone. When remarked that phone was dying, pt asked chaplain to call sister and tell sister address of hospital. Chaplain called sister and gave her address and phone number of ED. Fawn's phone number is [phone]. Page on-call chaplain if needed [phone]  11/12/13 1502  Clinical Encounter Type  Visited With Patient;Health care provider  Visit Type Initial  Referral From Nurse  Spiritual Encounters  Spiritual Needs Emotional  Stress Factors  Patient Stress Factors None identified  Advance Directives (For Healthcare)  Does patient have an advance directive? No  Would patient like information on creating an advanced directive? No - patient declined information  Charmian Muff, Chaplain 3:28 PM 11/12/2013

## 2013-11-12 NOTE — ED Notes (Signed)
Family at beside. Family given emotional support. 

## 2013-11-12 NOTE — ED Notes (Signed)
Iv attempt x 2 with u/s unsuccessful.

## 2013-11-12 NOTE — ED Notes (Signed)
Pt states she is taking birth control and denies any chance of being pregnant.

## 2013-11-12 NOTE — ED Notes (Signed)
[place] [employer] in speaking with pt.
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
## 2013-11-13 LAB — PREPARE FRESH FROZEN PLASMA
UNIT DIVISION: 0
Unit division: 0

## 2013-11-18 ENCOUNTER — Encounter (HOSPITAL_COMMUNITY): Payer: Self-pay | Admitting: Emergency Medicine

## 2013-12-05 ENCOUNTER — Other Ambulatory Visit: Payer: Self-pay

## 2014-12-11 ENCOUNTER — Emergency Department (HOSPITAL_COMMUNITY)
Admission: EM | Admit: 2014-12-11 | Discharge: 2014-12-11 | Disposition: A | Discharge status: Home / Self Care | Payer: Medicaid Other | Attending: Emergency Medicine | Admitting: Emergency Medicine

## 2014-12-11 DIAGNOSIS — Z3202 Encounter for pregnancy test, result negative: Secondary | ICD-10-CM | POA: Diagnosis not present

## 2014-12-11 DIAGNOSIS — F1110 Opioid abuse, uncomplicated: Secondary | ICD-10-CM | POA: Diagnosis not present

## 2014-12-11 DIAGNOSIS — R000 Tachycardia, unspecified: Secondary | ICD-10-CM | POA: Diagnosis not present

## 2014-12-11 DIAGNOSIS — Z880 Allergy status to penicillin: Secondary | ICD-10-CM | POA: Insufficient documentation

## 2014-12-11 DIAGNOSIS — F10120 Alcohol abuse with intoxication, uncomplicated: Secondary | ICD-10-CM | POA: Insufficient documentation

## 2014-12-11 DIAGNOSIS — Z720 Tobacco use: Secondary | ICD-10-CM | POA: Insufficient documentation

## 2014-12-11 DIAGNOSIS — Z792 Long term (current) use of antibiotics: Secondary | ICD-10-CM | POA: Insufficient documentation

## 2014-12-11 DIAGNOSIS — F10129 Alcohol abuse with intoxication, unspecified: Secondary | ICD-10-CM | POA: Diagnosis present

## 2014-12-11 DIAGNOSIS — Z7984 Long term (current) use of oral hypoglycemic drugs: Secondary | ICD-10-CM | POA: Insufficient documentation

## 2014-12-11 DIAGNOSIS — Z8639 Personal history of other endocrine, nutritional and metabolic disease: Secondary | ICD-10-CM | POA: Diagnosis not present

## 2014-12-11 DIAGNOSIS — Z79899 Other long term (current) drug therapy: Secondary | ICD-10-CM | POA: Diagnosis not present

## 2014-12-11 DIAGNOSIS — F1410 Cocaine abuse, uncomplicated: Secondary | ICD-10-CM | POA: Diagnosis not present

## 2014-12-11 DIAGNOSIS — F10920 Alcohol use, unspecified with intoxication, uncomplicated: Secondary | ICD-10-CM

## 2014-12-11 DIAGNOSIS — K219 Gastro-esophageal reflux disease without esophagitis: Secondary | ICD-10-CM | POA: Diagnosis not present

## 2014-12-11 LAB — CBC WITH DIFFERENTIAL/PLATELET
BASOS ABS: 0.1 10*3/uL (ref 0.0–0.1)
Basophils Relative: 1 %
Eosinophils Absolute: 0.6 10*3/uL (ref 0.0–0.7)
Eosinophils Relative: 9 %
HEMATOCRIT: 39 % (ref 36.0–46.0)
Hemoglobin: 13.2 g/dL (ref 12.0–15.0)
LYMPHS ABS: 2.2 10*3/uL (ref 0.7–4.0)
Lymphocytes Relative: 33 %
MCH: 37.3 pg — ABNORMAL HIGH (ref 26.0–34.0)
MCHC: 33.8 g/dL (ref 30.0–36.0)
MCV: 110.2 fL — AB (ref 78.0–100.0)
MONO ABS: 0.8 10*3/uL (ref 0.1–1.0)
MONOS PCT: 12 %
NEUTROS ABS: 3 10*3/uL (ref 1.7–7.7)
Neutrophils Relative %: 45 %
Platelets: 236 10*3/uL (ref 150–400)
RBC: 3.54 MIL/uL — ABNORMAL LOW (ref 3.87–5.11)
RDW: 15.1 % (ref 11.5–15.5)
WBC: 6.7 10*3/uL (ref 4.0–10.5)

## 2014-12-11 LAB — URINALYSIS, ROUTINE W REFLEX MICROSCOPIC
Bilirubin Urine: NEGATIVE
Glucose, UA: NEGATIVE mg/dL
Hgb urine dipstick: NEGATIVE
KETONES UR: NEGATIVE mg/dL
LEUKOCYTES UA: NEGATIVE
NITRITE: NEGATIVE
PROTEIN: NEGATIVE mg/dL
Specific Gravity, Urine: 1.009 (ref 1.005–1.030)
UROBILINOGEN UA: 0.2 mg/dL (ref 0.0–1.0)
pH: 5 (ref 5.0–8.0)

## 2014-12-11 LAB — COMPREHENSIVE METABOLIC PANEL
ALBUMIN: 3.6 g/dL (ref 3.5–5.0)
ALT: 96 U/L — ABNORMAL HIGH (ref 14–54)
ANION GAP: 10 (ref 5–15)
AST: 221 U/L — ABNORMAL HIGH (ref 15–41)
Alkaline Phosphatase: 125 U/L (ref 38–126)
BILIRUBIN TOTAL: 0.7 mg/dL (ref 0.3–1.2)
BUN: 7 mg/dL (ref 6–20)
CO2: 20 mmol/L — ABNORMAL LOW (ref 22–32)
Calcium: 8.9 mg/dL (ref 8.9–10.3)
Chloride: 110 mmol/L (ref 101–111)
Creatinine, Ser: 0.64 mg/dL (ref 0.44–1.00)
GFR calc Af Amer: >60 mL/min (ref >60)
Glucose, Bld: 115 mg/dL — ABNORMAL HIGH (ref 65–99)
POTASSIUM: 3.3 mmol/L — AB (ref 3.5–5.1)
Sodium: 140 mmol/L (ref 135–145)
TOTAL PROTEIN: 7.5 g/dL (ref 6.5–8.1)

## 2014-12-11 LAB — RAPID URINE DRUG SCREEN, HOSP PERFORMED
Amphetamines: NOT DETECTED
BENZODIAZEPINES: NOT DETECTED
Barbiturates: NOT DETECTED
COCAINE: POSITIVE — AB
OPIATES: POSITIVE — AB
Tetrahydrocannabinol: NOT DETECTED

## 2014-12-11 LAB — ETHANOL: Alcohol, Ethyl (B): 217 mg/dL — ABNORMAL HIGH (ref <5)

## 2014-12-11 LAB — I-STAT TROPONIN, ED: TROPONIN I, POC: 0 ng/mL (ref 0.00–0.08)

## 2014-12-11 LAB — I-STAT BETA HCG BLOOD, ED (MC, WL, AP ONLY): I-stat hCG, quantitative: <5 m[IU]/mL (ref <5)

## 2014-12-11 MED ORDER — LORAZEPAM 2 MG/ML IJ SOLN
1.0000 mg | Freq: Once | INTRAMUSCULAR | Status: AC
  Administered 2014-12-11: 1 mg via INTRAMUSCULAR

## 2014-12-11 MED ORDER — ZIPRASIDONE MESYLATE 20 MG IM SOLR
20.0000 mg | Freq: Once | INTRAMUSCULAR | Status: AC
  Administered 2014-12-11: 20 mg via INTRAMUSCULAR

## 2014-12-11 NOTE — ED Notes (Signed)
Attempted blood draw twice, patient screams no and rips touerniquet off per both attempts. RN aware.

## 2014-12-11 NOTE — Discharge Instructions (Signed)
Alcohol Intoxication  Alcohol intoxication occurs when the amount of alcohol that a person has consumed impairs his or her ability to mentally and physically function. Alcohol directly impairs the normal chemical activity of the brain. Drinking large amounts of alcohol can lead to changes in mental function and behavior, and it can cause many physical effects that can be harmful.   Alcohol intoxication can range in severity from mild to very severe. Various factors can affect the level of intoxication that occurs, such as the person's age, gender, weight, frequency of alcohol consumption, and the presence of other medical conditions (such as diabetes, seizures, or heart conditions). Dangerous levels of alcohol intoxication may occur when people drink large amounts of alcohol in a short period (binge drinking). Alcohol can also be especially dangerous when combined with certain prescription medicines or "recreational" drugs.  SIGNS AND SYMPTOMS  Some common signs and symptoms of mild alcohol intoxication include:  · Loss of coordination.  · Changes in mood and behavior.  · Impaired judgment.  · Slurred speech.  As alcohol intoxication progresses to more severe levels, other signs and symptoms will appear. These may include:  · Vomiting.  · Confusion and impaired memory.  · Slowed breathing.  · Seizures.  · Loss of consciousness.  DIAGNOSIS   Your health care provider will take a medical history and perform a physical exam. You will be asked about the amount and type of alcohol you have consumed. Blood tests will be done to measure the concentration of alcohol in your blood. In many places, your blood alcohol level must be lower than 80 mg/dL (0.08%) to legally drive. However, many dangerous effects of alcohol can occur at much lower levels.   TREATMENT   People with alcohol intoxication often do not require treatment. Most of the effects of alcohol intoxication are temporary, and they go away as the alcohol naturally  leaves the body. Your health care provider will monitor your condition until you are stable enough to go home. Fluids are sometimes given through an IV access tube to help prevent dehydration.   HOME CARE INSTRUCTIONS  · Do not drive after drinking alcohol.  · Stay hydrated. Drink enough water and fluids to keep your urine clear or pale yellow. Avoid caffeine.    · Only take over-the-counter or prescription medicines as directed by your health care provider.    SEEK MEDICAL CARE IF:   · You have persistent vomiting.    · You do not feel better after a few days.  · You have frequent alcohol intoxication. Your health care provider can help determine if you should see a substance use treatment counselor.  SEEK IMMEDIATE MEDICAL CARE IF:   · You become shaky or tremble when you try to stop drinking.    · You shake uncontrollably (seizure).    · You throw up (vomit) blood. This may be bright red or may look like black coffee grounds.    · You have blood in your stool. This may be bright red or may appear as a black, tarry, bad smelling stool.    · You become lightheaded or faint.    MAKE SURE YOU:   · Understand these instructions.  · Will watch your condition.  · Will get help right away if you are not doing well or get worse.     This information is not intended to replace advice given to you by your health care provider. Make sure you discuss any questions you have with your health care provider.       Document Released: 11/16/2004 Document Revised: 10/09/2012 Document Reviewed: 07/12/2012  Elsevier Interactive Patient Education ©2016 Elsevier Inc.
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
## 2014-12-11 NOTE — ED Notes (Signed)
Pt does not want this Clinical research associatewriter as her "staff". Megan MylarCarmen S attempted blood draw, pt refused. EDP and RN Lanice ShirtsKristin M notified

## 2014-12-11 NOTE — ED Notes (Signed)
EKG given to EDP,Yelverton,MD., for review. 

## 2014-12-11 NOTE — ED Notes (Signed)
Two unsuccessful blood draw by this RN; Vanessa DurhamHayley Bowman verbalizes will attempt blood draw.

## 2014-12-11 NOTE — ED Notes (Signed)
Per EMS, patient picked up at home. Initial call for panic attack. Patient was found to be heavily intoxicated. Patient had drank a fifth of vodka per EMS. Patient reported to EMS that she is "mentally unstable and needs to seek help". Patient was originally placed in hallway B, patient began screaming and shouting that she wanted a room. Patient was advised she could either be placed in the hallway bed she was being offered or taken to the lobby. Patient again refused to move off stretcher. With assistance of offduty [employer] and security patient was removed from EMS stretcher and placed in a wheelchair. While attempting to take patient to lobby she continued to scream and threaten staff, stating she was calling her lawyer and demanding to be transferred to another facility. Patient attempted to throw herself from the wheelchair onto the floor. Dr Ranae PalmsYelverton approached patient and ordered patient to be taken back to a room so that he could evaluate her, patient is too intoxicated to refuse medical treatment. Patient was given 20mg  IM Geodon and 1mg  IM Ativan per MD order. Patient with 2 bags when she arrived. These bags have been placed at her bedside in room 12.

## 2014-12-11 NOTE — ED Provider Notes (Signed)
Assumed care of pt at shift change.  Pt IVC'ed for potential danger to self.  On assumption of care awaiting sobriety, planned dc home.    Sober, denies HI/SI.  Not a danger to self.  DC home in stable condition  Mirian MoMatthew Iriel Nason, MD 12/17/14 [phone]

## 2015-04-06 ENCOUNTER — Other Ambulatory Visit (HOSPITAL_COMMUNITY): Payer: Self-pay | Admitting: Physician Assistant

## 2015-04-06 DIAGNOSIS — B1920 Unspecified viral hepatitis C without hepatic coma: Secondary | ICD-10-CM

## 2015-04-12 ENCOUNTER — Ambulatory Visit (HOSPITAL_COMMUNITY)
Admission: RE | Admit: 2015-04-12 | Discharge: 2015-04-12 | Disposition: A | Discharge status: Home / Self Care | Payer: Medicaid Other | Source: Ambulatory Visit | Attending: Physician Assistant | Admitting: Physician Assistant

## 2015-04-12 DIAGNOSIS — B182 Chronic viral hepatitis C: Secondary | ICD-10-CM | POA: Insufficient documentation

## 2015-04-12 DIAGNOSIS — K769 Liver disease, unspecified: Secondary | ICD-10-CM | POA: Diagnosis not present

## 2015-04-12 DIAGNOSIS — B1920 Unspecified viral hepatitis C without hepatic coma: Secondary | ICD-10-CM

## 2015-05-16 IMAGING — US US BIOPSY
1 series · 7 of 7 positions shown · non-contrast
Comparison: none

INDICATION: Elevated LFTs, chronic hepatitis-C

[Series 1: us biopsy · 0.23mm/px · 7 of 7 slices shown]
[im 1/7]
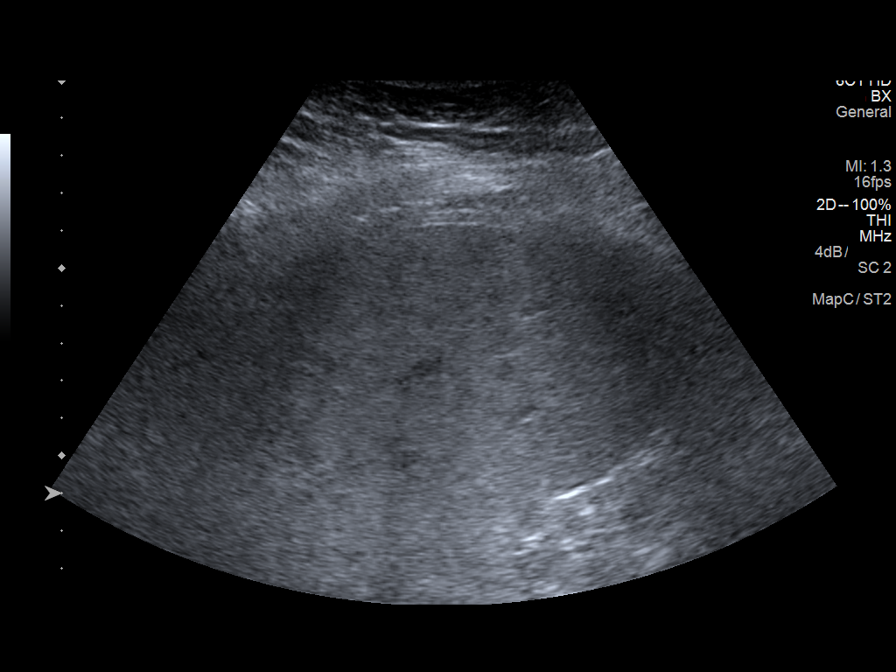
[im 2/7]
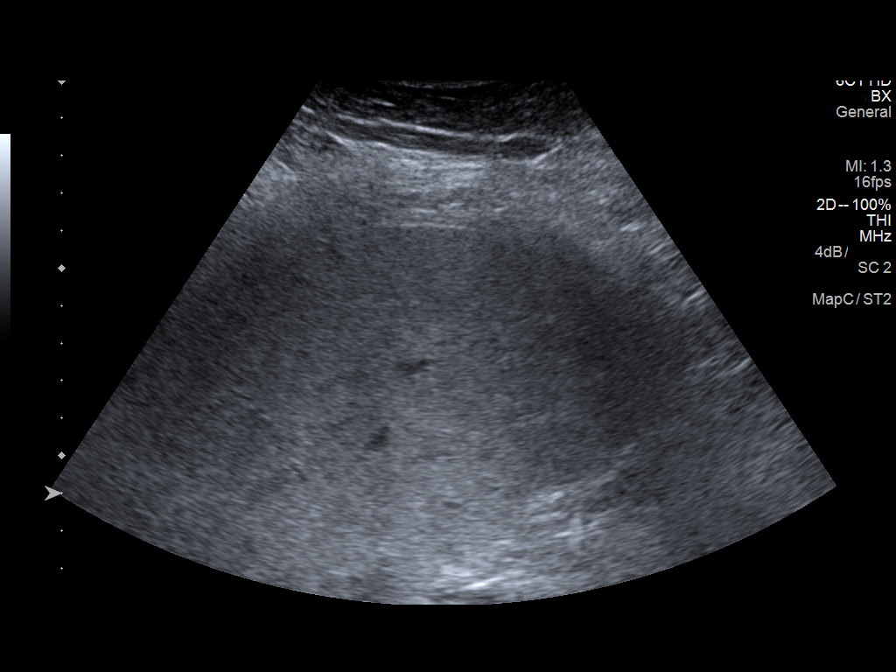
[im 3/7]
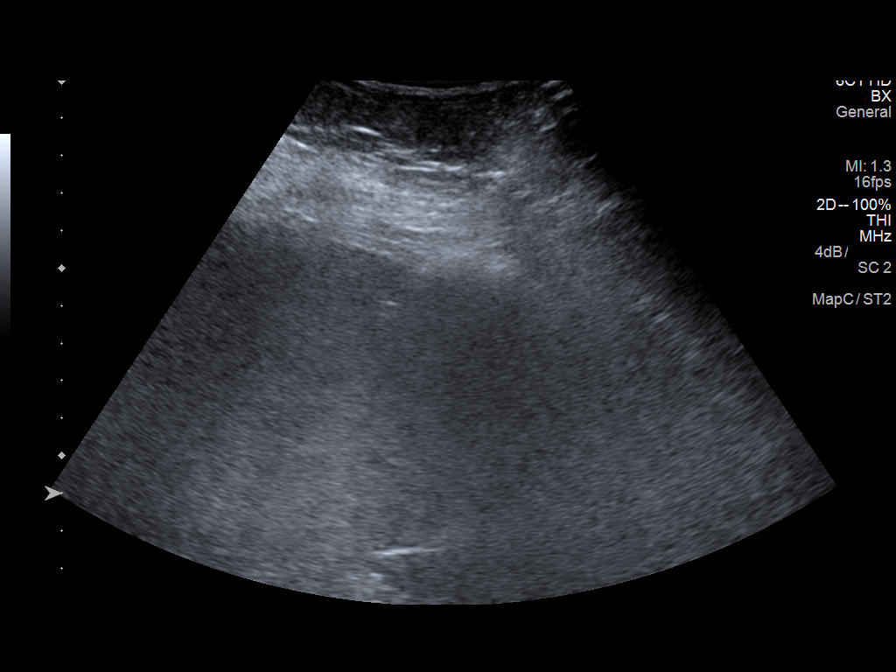
[im 4/7]
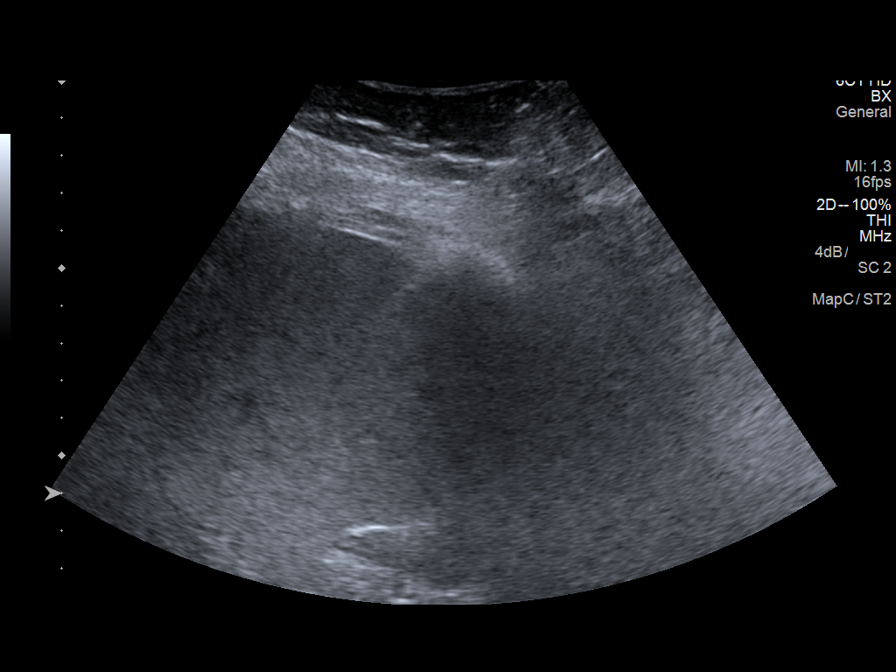
[im 5/7]
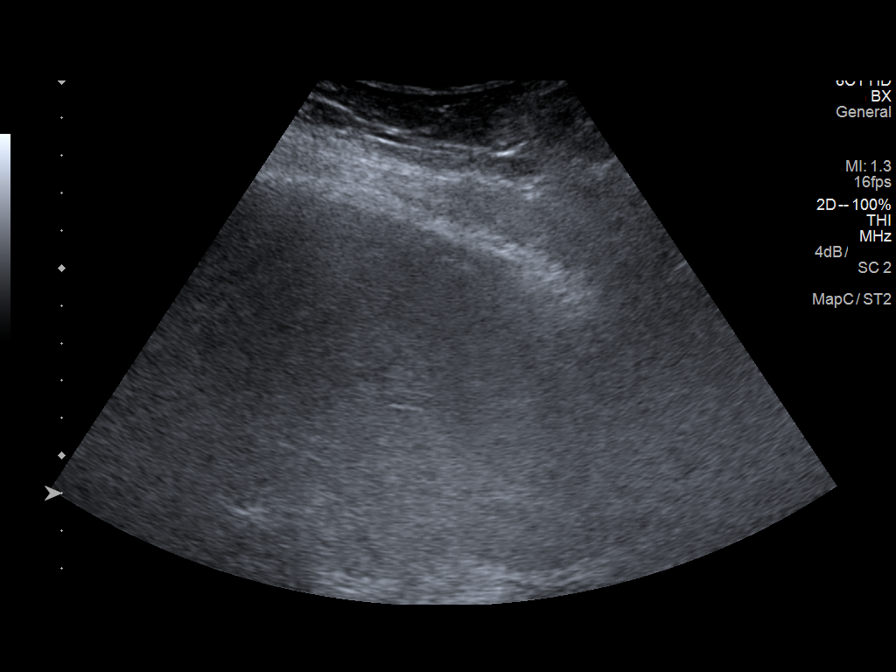
[im 6/7]
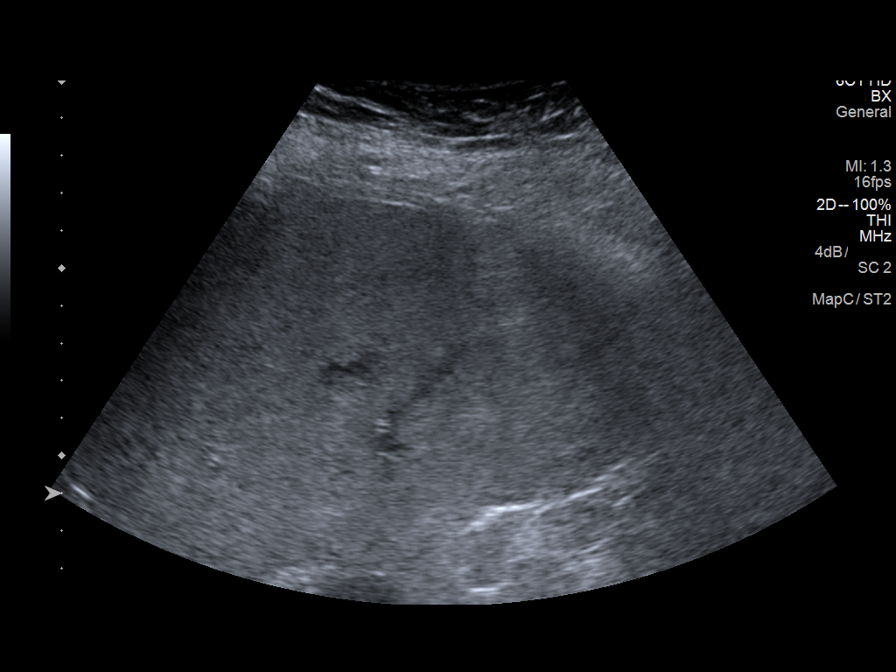
[im 7/7]
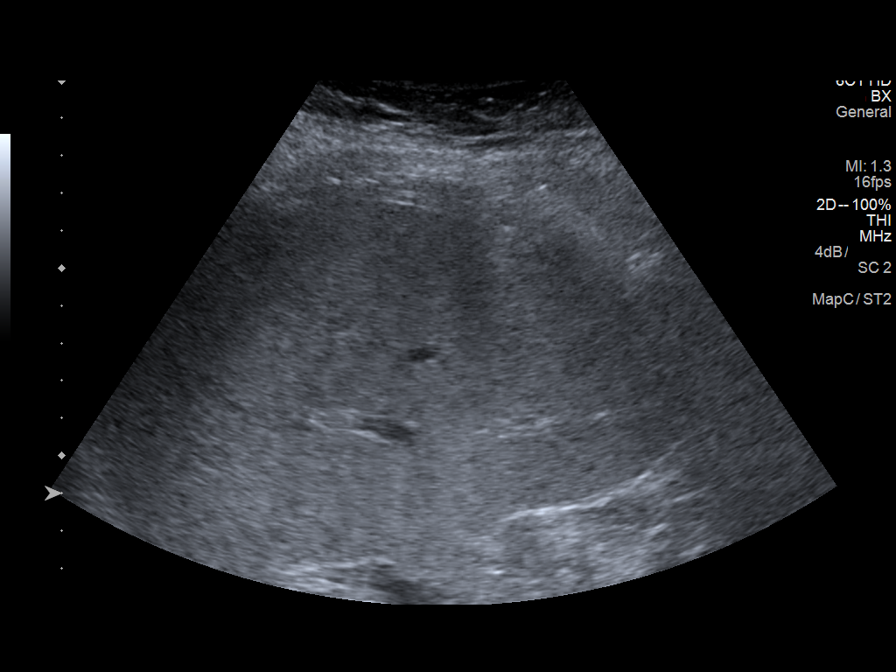

[7 of 7 positions shown; findings below may reference images not displayed]

EXAM:
ULTRASOUND BIOPSY CORE LIVER

MEDICATIONS:
1% lidocaine locally

ANESTHESIA/SEDATION:
Moderate (conscious) sedation was employed during this procedure. A
total of Versed 3.0 mg and Fentanyl 100 mcg was administered
intravenously.

Moderate Sedation Time: 10 minutes. The patient's level of
consciousness and vital signs were monitored continuously by
radiology nursing throughout the procedure under my direct
supervision.

FLUOROSCOPY TIME:  Fluoroscopy Time: None.

COMPLICATIONS:
None immediate.

PROCEDURE:
Informed written consent was obtained from the patient after a
thorough discussion of the procedural risks, benefits and
alternatives. All questions were addressed. Maximal Sterile Barrier
Technique was utilized including caps, mask, sterile gowns, sterile
gloves, sterile drape, hand hygiene and skin antiseptic. A timeout
was performed prior to the initiation of the procedure.

Previous imaging reviewed. Preliminary ultrasound performed of the
right hepatic lobe in the mid axillary line through a lower
intercostal space. Liver is diffusely echogenic compatible with
hepatic steatosis.

Under sterile conditions and local anesthesia, a 17 gauge 6.8 cm
access needle was advanced into the right hepatic lobe through a
lower intercostal space. Needle position confirmed with ultrasound.
2 18 gauge core biopsies obtained. Samples placed in formalin.
Postprocedure imaging demonstrates no hemorrhage or hematoma.

During and after the biopsy, the patient experienced right upper
quadrant pain radiating to the shoulder. Repeat survey ultrasound
confirms no perihepatic or subcapsular hemorrhage. No free fluid. No
evidence of complication noted. Patient has a history of anxiety
attacks, and that appears to be what she is currently having. This
required additional observation only. Vital signs remain stable.
IMPRESSION: Successful ultrasound right hepatic lobe random core biopsy.

## 2015-05-25 ENCOUNTER — Other Ambulatory Visit (HOSPITAL_COMMUNITY): Payer: Self-pay | Admitting: Nurse Practitioner

## 2015-05-25 DIAGNOSIS — R7989 Other specified abnormal findings of blood chemistry: Secondary | ICD-10-CM

## 2015-05-25 DIAGNOSIS — R945 Abnormal results of liver function studies: Principal | ICD-10-CM

## 2015-05-25 DIAGNOSIS — B182 Chronic viral hepatitis C: Secondary | ICD-10-CM

## 2015-06-01 ENCOUNTER — Other Ambulatory Visit: Payer: Self-pay | Admitting: Radiology

## 2015-06-01 NOTE — Patient Instructions (Signed)
NPO after MN, needs driver

## 2015-06-02 ENCOUNTER — Other Ambulatory Visit: Payer: Self-pay | Admitting: Physician Assistant

## 2015-06-02 ENCOUNTER — Other Ambulatory Visit: Payer: Self-pay | Admitting: General Surgery

## 2015-06-03 ENCOUNTER — Ambulatory Visit (HOSPITAL_COMMUNITY)
Admission: RE | Admit: 2015-06-03 | Discharge: 2015-06-03 | Disposition: A | Discharge status: Home / Self Care | Payer: Medicaid Other | Source: Ambulatory Visit | Attending: Nurse Practitioner | Admitting: Nurse Practitioner

## 2015-06-07 ENCOUNTER — Ambulatory Visit (HOSPITAL_COMMUNITY)
Admission: RE | Admit: 2015-06-07 | Discharge: 2015-06-07 | Disposition: A | Discharge status: Home / Self Care | Payer: Medicaid Other | Source: Ambulatory Visit | Attending: Nurse Practitioner | Admitting: Nurse Practitioner

## 2015-06-07 ENCOUNTER — Encounter (HOSPITAL_COMMUNITY): Payer: Self-pay

## 2015-06-07 DIAGNOSIS — R7989 Other specified abnormal findings of blood chemistry: Secondary | ICD-10-CM | POA: Diagnosis present

## 2015-06-07 DIAGNOSIS — K219 Gastro-esophageal reflux disease without esophagitis: Secondary | ICD-10-CM | POA: Diagnosis not present

## 2015-06-07 DIAGNOSIS — K7460 Unspecified cirrhosis of liver: Secondary | ICD-10-CM | POA: Insufficient documentation

## 2015-06-07 DIAGNOSIS — B182 Chronic viral hepatitis C: Secondary | ICD-10-CM

## 2015-06-07 DIAGNOSIS — Z79899 Other long term (current) drug therapy: Secondary | ICD-10-CM | POA: Diagnosis not present

## 2015-06-07 DIAGNOSIS — K759 Inflammatory liver disease, unspecified: Secondary | ICD-10-CM | POA: Diagnosis not present

## 2015-06-07 DIAGNOSIS — E7800 Pure hypercholesterolemia, unspecified: Secondary | ICD-10-CM | POA: Diagnosis not present

## 2015-06-07 DIAGNOSIS — R945 Abnormal results of liver function studies: Secondary | ICD-10-CM

## 2015-06-07 LAB — CBC
HCT: 41 % (ref 36.0–46.0)
Hemoglobin: 13.2 g/dL (ref 12.0–15.0)
MCH: 33 pg (ref 26.0–34.0)
MCHC: 32.2 g/dL (ref 30.0–36.0)
MCV: 102.5 fL — ABNORMAL HIGH (ref 78.0–100.0)
PLATELETS: 229 10*3/uL (ref 150–400)
RBC: 4 MIL/uL (ref 3.87–5.11)
RDW: 14.1 % (ref 11.5–15.5)
WBC: 8 10*3/uL (ref 4.0–10.5)

## 2015-06-07 LAB — PREGNANCY, URINE: Preg Test, Ur: NEGATIVE

## 2015-06-07 LAB — PROTIME-INR
INR: 1.01 (ref 0.00–1.49)
PROTHROMBIN TIME: 13.5 s (ref 11.6–15.2)

## 2015-06-07 LAB — APTT: aPTT: 31 seconds (ref 24–37)

## 2015-06-07 MED ORDER — MIDAZOLAM HCL 2 MG/2ML IJ SOLN
INTRAMUSCULAR | Status: AC | PRN
  Administered 2015-06-07 (×4): 1 mg via INTRAVENOUS

## 2015-06-07 MED ORDER — LIDOCAINE HCL (PF) 1 % IJ SOLN
INTRAMUSCULAR | Status: AC
  Filled 2015-06-07: qty 10

## 2015-06-07 MED ORDER — SODIUM CHLORIDE 0.9 % IV SOLN
INTRAVENOUS | Status: AC | PRN
  Administered 2015-06-07: 10 mL/h via INTRAVENOUS

## 2015-06-07 MED ORDER — FENTANYL CITRATE (PF) 100 MCG/2ML IJ SOLN
INTRAMUSCULAR | Status: AC | PRN
  Administered 2015-06-07: 50 ug via INTRAVENOUS
  Administered 2015-06-07 (×2): 25 ug via INTRAVENOUS

## 2015-06-07 MED ORDER — MIDAZOLAM HCL 2 MG/2ML IJ SOLN
INTRAMUSCULAR | Status: AC
  Filled 2015-06-07: qty 2

## 2015-06-07 MED ORDER — HYDROCODONE-ACETAMINOPHEN 5-325 MG PO TABS
ORAL_TABLET | ORAL | Status: AC
  Filled 2015-06-07: qty 2

## 2015-06-07 MED ORDER — HYDROCODONE-ACETAMINOPHEN 5-325 MG PO TABS
1.0000 | ORAL_TABLET | ORAL | Status: DC | PRN
  Administered 2015-06-07: 2 via ORAL

## 2015-06-07 MED ORDER — FENTANYL CITRATE (PF) 100 MCG/2ML IJ SOLN
INTRAMUSCULAR | Status: AC
  Filled 2015-06-07: qty 2

## 2015-06-07 MED ORDER — SODIUM CHLORIDE 0.9 % IV SOLN: Freq: Once | INTRAVENOUS | Status: DC

## 2015-06-07 MED ORDER — GELATIN ABSORBABLE 12-7 MM EX MISC
CUTANEOUS | Status: AC
  Filled 2015-06-07: qty 1

## 2015-06-07 NOTE — Discharge Instructions (Signed)

## 2015-06-07 NOTE — Sedation Documentation (Signed)
Tolerating ice chips well. States her pain is better. VS stable. In no distress.

## 2015-06-07 NOTE — Procedures (Signed)
Successful US rt liver core bx No comp Stable Path pending Full report in PACS

## 2015-06-07 NOTE — Sedation Documentation (Signed)
Pt continuous to complain of pain. Transferred to nurses station. States pain is on her right shoulder and neck. Dr. Miles CostainShick paged.

## 2015-06-07 NOTE — Sedation Documentation (Signed)
Dr. Miles CostainShick at bedside. Pt crying and very anxious. Ultrasound done. Dr. Miles CostainShick reassurred pt no complications from liver bx. Pt states she did not take any of her anxiety medications today.

## 2015-06-07 NOTE — H&P (Signed)
Chief Complaint: elevated liver enzymes  Referring Physician:Dawn Drazek  Supervising Physician: Ruel FavorsShick, Trevor  HPI: Megan AmesRebecca [name] is an 35 y.o. female with a history of Hep C who was planning on starting treatment but her AST and ALT were noted to be higher than normal.  Prior to starting her treatment, her primary providers wanted to send her for a random liver biopsy.  The patient has been feeling well with no complaints.  She presents today for this procedure.  Past Medical History:  Past Medical History  Diagnosis Date  . Hypercholesteremia   . GERD (gastroesophageal reflux disease)     Past Surgical History:  Past Surgical History  Procedure Laterality Date  . Hand surgery    . Tonsillectomy      Family History: History reviewed. No pertinent family history.  Social History:  reports that she has been smoking Cigarettes.  She has a 7.5 pack-year smoking history. She does not have any smokeless tobacco history on file. She reports that she drinks alcohol. She reports that she does not use illicit drugs.  Allergies:  Allergies  Allergen Reactions  . Penicillins Other (See Comments)    Childhood allergy, unknown  . Penicillins Other (See Comments)    Childhood reaction Has patient had a PCN reaction causing immediate rash, facial/tongue/throat swelling, SOB or lightheadedness with hypotension: NoNo Has patient had a PCN reaction causing severe rash involving mucus membranes or skin necrosis: NoNo Has patient had a PCN reaction that required hospitalization NoNo Has patient had a PCN reaction occurring within the last 10 years: NoNo If all of the above answers are "NO", then may proceed    Medications:   Medication List    ASK your doctor about these medications        clonazePAM 0.5 MG tablet  Commonly known as:  KLONOPIN  Take 0.5 mg by mouth daily as needed for anxiety. Reported on 06/01/2015     DULoxetine 60 MG capsule  Commonly known as:  CYMBALTA  Take  60 mg by mouth daily. Reported on 06/01/2015     escitalopram 10 MG tablet  Commonly known as:  LEXAPRO  Take 10 mg by mouth daily.     omeprazole 40 MG capsule  Commonly known as:  PRILOSEC  Take 40 mg by mouth 2 (two) times daily. Reported on 06/01/2015     prazosin 2 MG capsule  Commonly known as:  MINIPRESS  Take 2 mg by mouth at bedtime.     traZODone 50 MG tablet  Commonly known as:  DESYREL  Take 50 mg by mouth See admin instructions. PT IS TO TAKE  MEDICATION AT BEDTIME, AND AS NEEDED        Please HPI for pertinent positives, otherwise complete 10 system ROS negative.  Mallampati Score: MD Evaluation Airway: WNL Heart: WNL Abdomen: WNL Chest/ Lungs: WNL ASA  Classification: 2 Mallampati/Airway Score: One  Physical Exam: BP 116/79 mmHg  Pulse 68  Temp(Src) 97.7 F (36.5 C)  Resp 17  SpO2 97%  LMP 06/07/2015 There is no weight on file to calculate BMI. General: pleasant, obese white female who is laying in bed in NAD HEENT: head is normocephalic, atraumatic.  Sclera are noninjected.  PERRL.  Ears and nose without any masses or lesions.  Mouth is pink and moist Heart: regular, rate, and rhythm.  Normal s1,s2. No obvious murmurs, gallops, or rubs noted.  Palpable radial and pedal pulses bilaterally Lungs: CTAB, no wheezes, rhonchi, or rales noted.  Respiratory effort nonlabored Abd: soft, NT, ND, +BS, no masses, hernias, or organomegaly MS: all 4 extremities are symmetrical with no cyanosis, clubbing, or edema. Psych: A&Ox3 with an appropriate affect.   Labs: Results for orders placed or performed during the hospital encounter of 06/07/15 (from the past 48 hour(s))  APTT upon arrival     Status: None   Collection Time: 06/07/15 11:14 AM  Result Value Ref Range   aPTT 31 24 - 37 seconds  CBC upon arrival     Status: Abnormal   Collection Time: 06/07/15 11:14 AM  Result Value Ref Range   WBC 8.0 4.0 - 10.5 K/uL   RBC 4.00 3.87 - 5.11 MIL/uL   Hemoglobin  13.2 12.0 - 15.0 g/dL   HCT 16.1 09.6 - 04.5 %   MCV 102.5 (H) 78.0 - 100.0 fL   MCH 33.0 26.0 - 34.0 pg   MCHC 32.2 30.0 - 36.0 g/dL   RDW 40.9 81.1 - 91.4 %   Platelets 229 150 - 400 K/uL  Protime-INR upon arrival     Status: None   Collection Time: 06/07/15 11:14 AM  Result Value Ref Range   Prothrombin Time 13.5 11.6 - 15.2 seconds   INR 1.01 0.00 - 1.49  Pregnancy, urine     Status: None   Collection Time: 06/07/15 12:44 PM  Result Value Ref Range   Preg Test, Ur NEGATIVE NEGATIVE    Comment:        THE SENSITIVITY OF THIS METHODOLOGY IS >20 mIU/mL.     Imaging: No results found.  Assessment/Plan 1. Elevated liver enzymes -will plan for random liver biopsy today. -all labs and vitals have been reviewed -Risks and Benefits discussed with the patient including, but not limited to bleeding, infection, damage to adjacent structures or low yield requiring additional tests. All of the patient's questions were answered, patient is agreeable to proceed. Consent signed and in chart.    Thank you for this interesting consult.  I greatly enjoyed meeting [name] and look forward to participating in their care.  A copy of this report was sent to the requesting provider on this date.  Electronically Signed: Letha Cape 06/07/2015, 1:01 PM   I spent a total of  30 Minutes   in face to face in clinical consultation, greater than 50% of which was counseling/coordinating care for elevated liver enzymes

## 2015-06-07 NOTE — Sedation Documentation (Signed)
Report given to Peggy, RN.

## 2015-06-07 NOTE — Progress Notes (Signed)
NO IVF'S RUNNING ON ARRIVAL TO [facility] FROM RADIOLOGY. PIV IS SL'ED.

## 2015-06-07 NOTE — Sedation Documentation (Signed)
Pt complaining of pain to right shoulder and neck. Dr. Miles CostainShick paged.

## 2015-06-07 NOTE — Sedation Documentation (Signed)
Pt eating ice chips and states she is feeling better. States she thinks she was having a panic attack.

## 2015-06-08 ENCOUNTER — Ambulatory Visit (HOSPITAL_COMMUNITY): Payer: Medicaid Other

## 2015-10-05 ENCOUNTER — Other Ambulatory Visit: Payer: Self-pay | Admitting: Nurse Practitioner

## 2015-10-05 DIAGNOSIS — K7469 Other cirrhosis of liver: Secondary | ICD-10-CM

## 2015-10-12 ENCOUNTER — Ambulatory Visit
Admission: RE | Admit: 2015-10-12 | Discharge: 2015-10-12 | Disposition: A | Discharge status: Home / Self Care | Payer: Medicaid Other | Source: Ambulatory Visit | Attending: Nurse Practitioner | Admitting: Nurse Practitioner

## 2015-10-12 DIAGNOSIS — K7469 Other cirrhosis of liver: Secondary | ICD-10-CM

## 2015-10-12 IMAGING — US US ABDOMEN LIMITED
1 series · 14 of 25 positions shown · non-contrast
Comparison: Abdominal ultrasound [DATE]

CLINICAL DATA: Cirrhosis, hepatic cellular carcinoma screening,
six-month follow-up study, no current complaints.

EXAM:
US ABDOMEN LIMITED - RIGHT UPPER QUADRANT

[Series 1: us abdomen limited · 0.24mm/px · 14 of 45 slices shown]
[im 1/45]
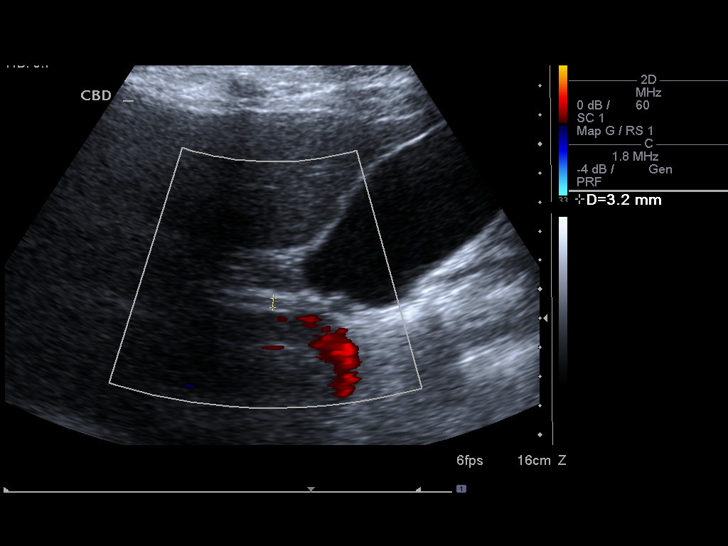
[im 4/45]
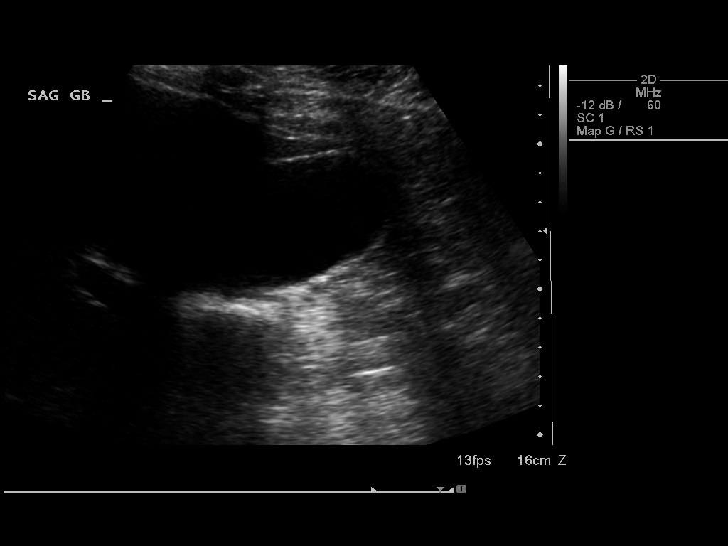
[im 8/45]
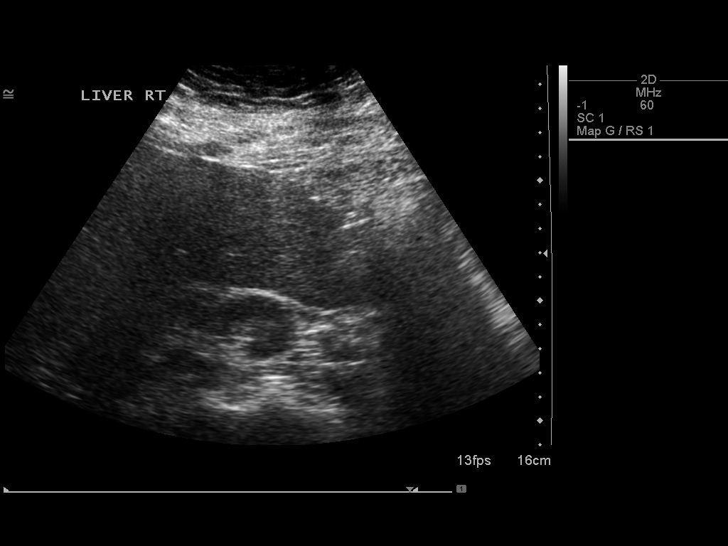
[im 12/45]
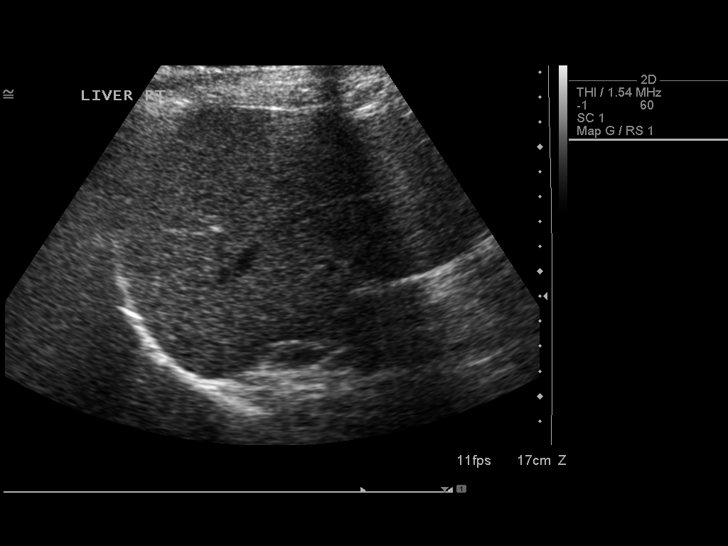
[im 15/45]
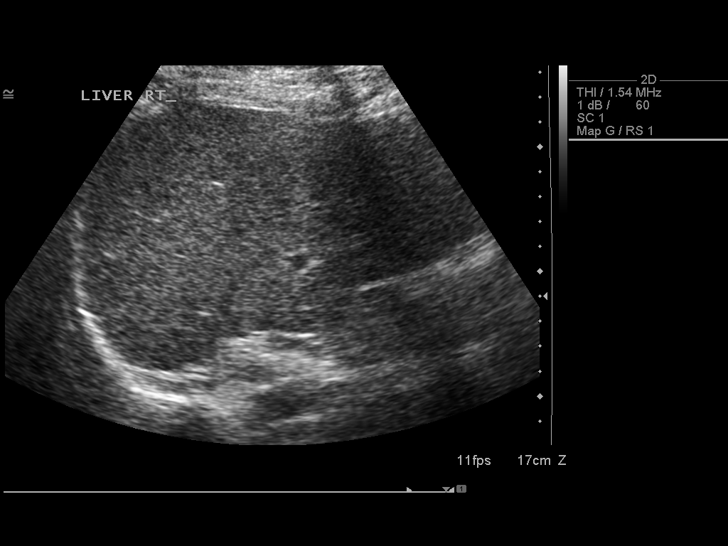
[im 17/45]
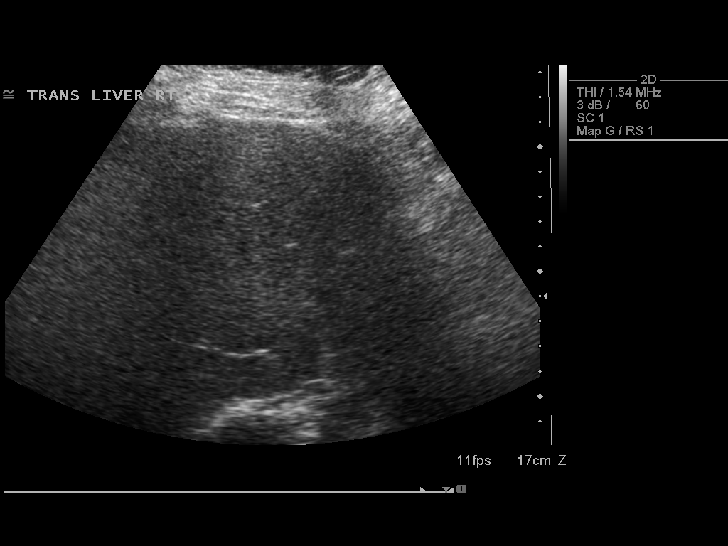
[im 21/45]
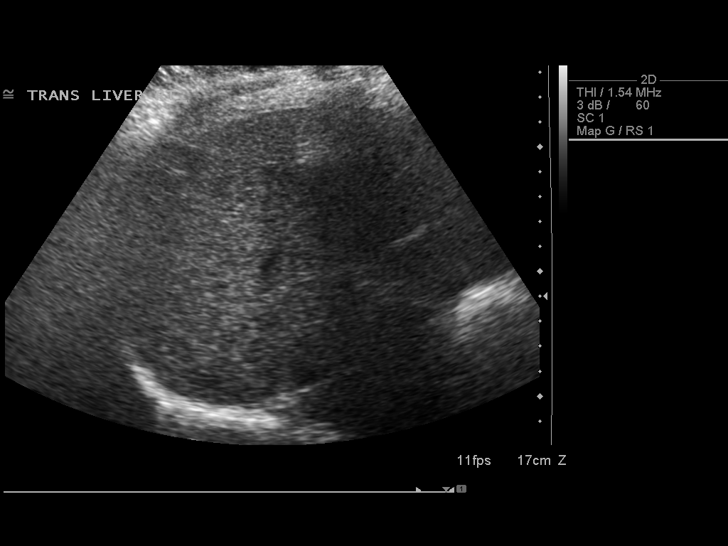
[im 24/45]
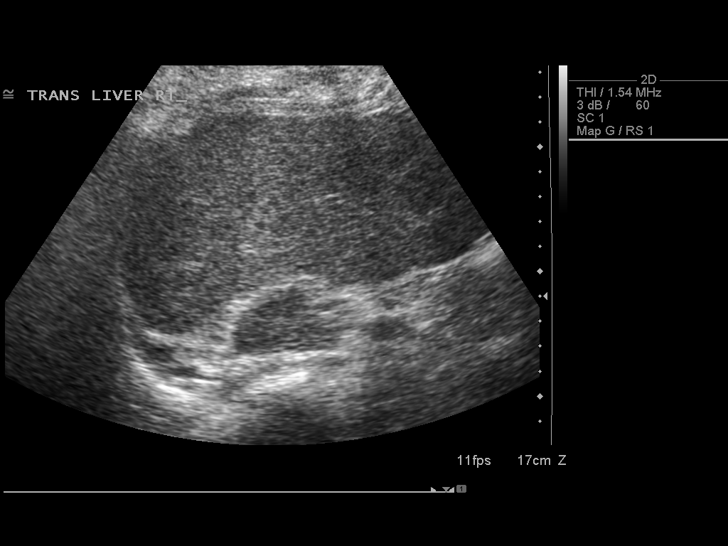
[im 28/45]
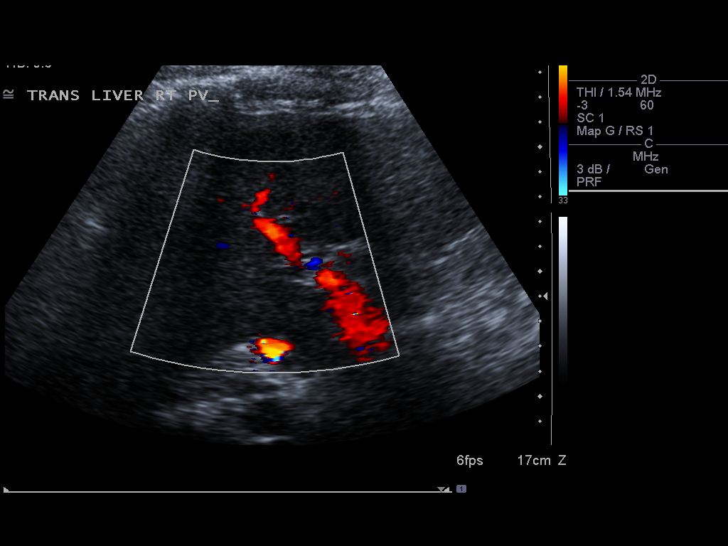
[im 30/45]
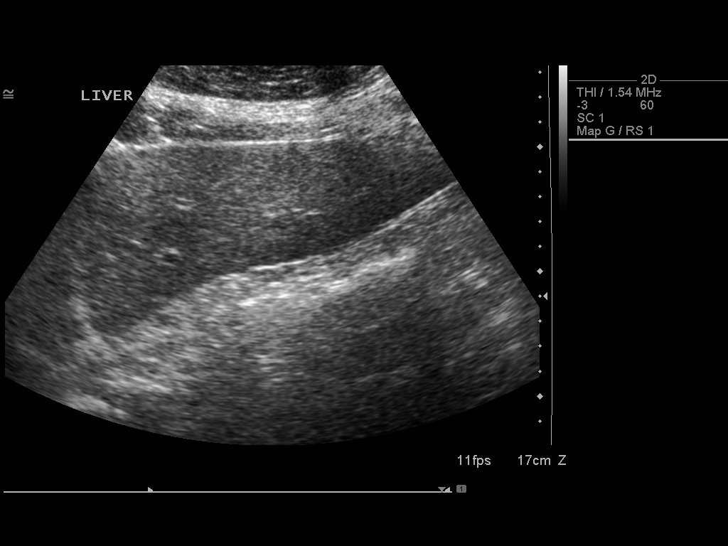
[im 34/45]
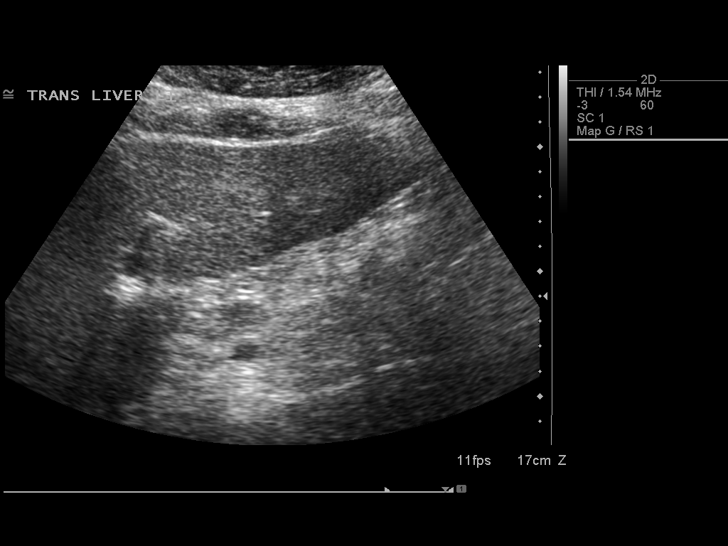
[im 37/45]
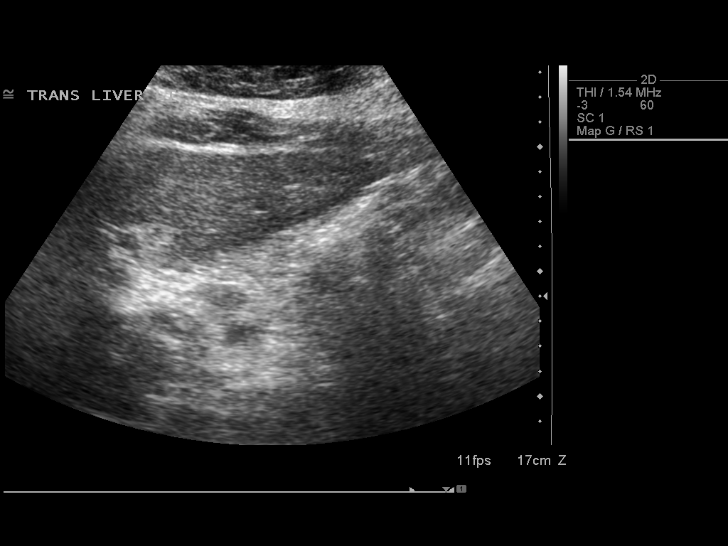
[im 41/45]
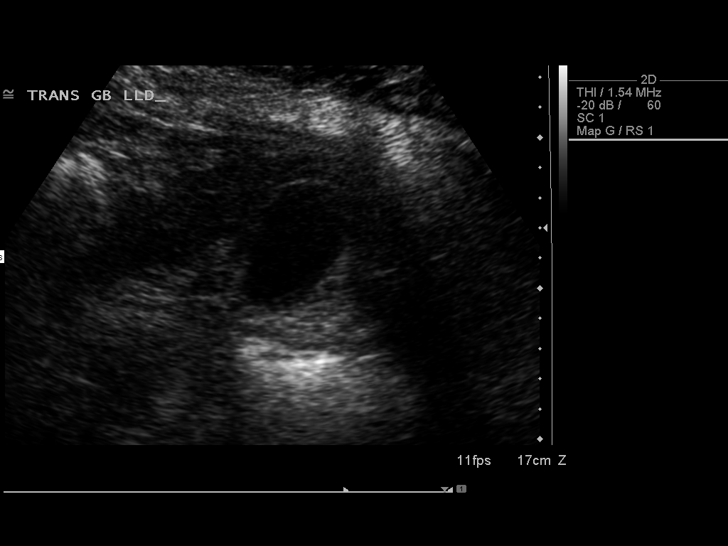
[im 45/45]
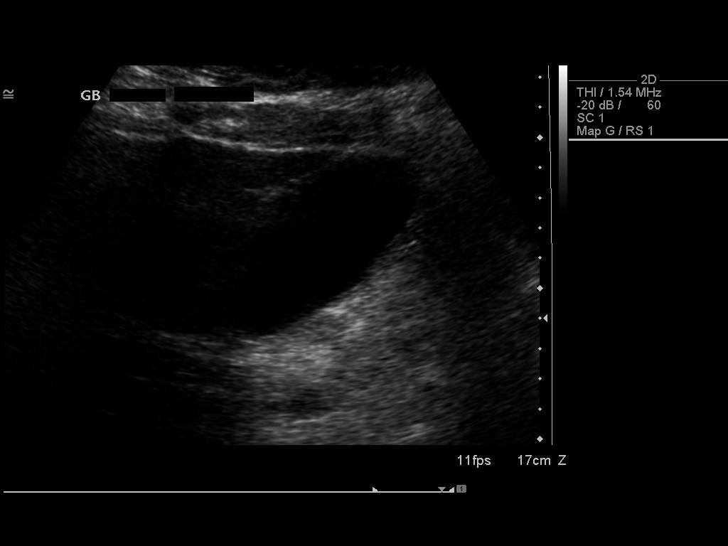

[14 of 25 positions shown; findings below may reference images not displayed]

FINDINGS: Gallbladder:

No gallstones or wall thickening visualized. No sonographic Murphy
sign noted by sonographer.

Common bile duct:

Diameter: 3.2 mm

Liver:

The hepatic echotexture is heterogeneous. The surface contour of the
liver is fairly smooth. No focal mass or ductal dilation.
IMPRESSION: Heterogeneous hepatic echotexture without suspicious masses
consistent with known cirrhosis. Normal appearance of the
gallbladder and common bile duct.

## 2015-11-22 DIAGNOSIS — Z8619 Personal history of other infectious and parasitic diseases: Secondary | ICD-10-CM

## 2015-11-22 HISTORY — DX: Personal history of other infectious and parasitic diseases: Z86.19

## 2016-02-24 ENCOUNTER — Other Ambulatory Visit: Payer: Self-pay | Admitting: Physician Assistant

## 2016-02-24 DIAGNOSIS — R079 Chest pain, unspecified: Secondary | ICD-10-CM

## 2016-03-07 ENCOUNTER — Other Ambulatory Visit: Payer: Self-pay

## 2016-03-07 ENCOUNTER — Ambulatory Visit (HOSPITAL_COMMUNITY): Payer: Medicaid Other | Attending: Cardiology

## 2016-03-07 DIAGNOSIS — R079 Chest pain, unspecified: Secondary | ICD-10-CM | POA: Diagnosis not present

## 2016-03-27 ENCOUNTER — Telehealth: Payer: Self-pay | Admitting: Physician Assistant

## 2016-03-27 NOTE — Telephone Encounter (Signed)
[facility] for appointment on 03/29/16 with Azalee CourseHao Meng, PA.  Records put with Hao,s schedule for 03/29/16. lp

## 2016-03-29 ENCOUNTER — Ambulatory Visit (INDEPENDENT_AMBULATORY_CARE_PROVIDER_SITE_OTHER): Payer: Medicaid Other | Admitting: Physician Assistant

## 2016-03-29 ENCOUNTER — Encounter: Payer: Self-pay | Admitting: Physician Assistant

## 2016-03-29 VITALS — BP 153/108 | HR 99 | Ht 69.0 in | Wt 288.0 lb

## 2016-03-29 DIAGNOSIS — R079 Chest pain, unspecified: Secondary | ICD-10-CM | POA: Diagnosis not present

## 2016-03-29 DIAGNOSIS — E785 Hyperlipidemia, unspecified: Secondary | ICD-10-CM | POA: Diagnosis not present

## 2016-03-29 DIAGNOSIS — K7460 Unspecified cirrhosis of liver: Secondary | ICD-10-CM

## 2016-03-29 DIAGNOSIS — Z8619 Personal history of other infectious and parasitic diseases: Secondary | ICD-10-CM

## 2016-03-29 MED ORDER — ASPIRIN EC 81 MG PO TBEC: 81.0000 mg | DELAYED_RELEASE_TABLET | Freq: Every day | ORAL | 0 refills | Status: DC

## 2016-03-29 MED ORDER — NITROGLYCERIN 0.4 MG SL SUBL: 0.4000 mg | SUBLINGUAL_TABLET | SUBLINGUAL | 3 refills | Status: DC | PRN

## 2016-03-29 NOTE — Patient Instructions (Addendum)
Medication Instructions:   1.I START TAKING ASPRIN 81 MG ONCE A DAY   2. START TAKING NITROGLYCERINE 0.4 MG SUBLINGUAL FOR CHEST PAIN AS NEEDED   NO MORE THAN 3       TABLETS IN 15 MINUTES  INSTRUCTIONS INCLUDED IN SPECIAL INSTRUCTIONS SECTIONS     If you need a refill on your cardiac medications before your next appointment, please call your pharmacy.  Labwork:  PREGNANCY TEST    Testing/Procedures: ( 2 DAY )..Your physician has requested that you have en exercise stress myoview. For further information please visit [email or web address] Please follow instruction sheet, as given.     Follow-Up: IN 2 TO 4 WEEKS WITH [name]   Any Other Special Instructions Will Be Listed Below (If Applicable).                                                                                                                                             Nitroglycerin sublingual tablets What is this medicine? NITROGLYCERIN (nye troe GLI ser in) is a type of vasodilator. It relaxes blood vessels, increasing the blood and oxygen supply to your heart. This medicine is used to relieve chest pain caused by angina. It is also used to prevent chest pain before activities like climbing stairs, going outdoors in cold weather, or sexual activity. This medicine may be used for other purposes; ask your health care provider or pharmacist if you have questions. COMMON BRAND NAME(S): Nitroquick, Nitrostat, Nitrotab  How should I use this medicine? Take this medicine by mouth as needed. At the first sign of an angina attack (chest pain or tightness) place one tablet under your tongue. You can also take this medicine 5 to 10 minutes before an event likely to produce chest pain. Follow the directions on the prescription label. Let the tablet dissolve under the tongue. Do not swallow whole. Replace the dose if you accidentally swallow it. It will help if your mouth is not dry. Saliva around the tablet will help it to  dissolve more quickly. Do not eat or drink, smoke or chew tobacco while a tablet is dissolving. If you are not better within 5 minutes after taking ONE dose of nitroglycerin, call 9-1-1 immediately to seek emergency medical care. Do not take more than 3 nitroglycerin tablets over 15 minutes. If you take this medicine often to relieve symptoms of angina, your doctor or health care professional may provide you with different instructions to manage your symptoms. If symptoms do not go away after following these instructions, it is important to call 9-1-1 immediately. Do not take more than 3 nitroglycerin tablets over 15 minutes. Talk to your pediatrician regarding the use of this medicine in children. Special care may be needed. Overdosage: If you think you have taken too much of this medicine contact a poison control center or emergency room at once. NOTE: This  medicine is only for you. Do not share this medicine with others.  What if I miss a dose? This does not apply. This medicine is only used as needed.  What may interact with this medicine? Do not take this medicine with any of the following medications: -certain migraine medicines like ergotamine and dihydroergotamine (DHE) -medicines used to treat erectile dysfunction like sildenafil, tadalafil, and vardenafil -riociguat This medicine may also interact with the following medications: -alteplase -aspirin -heparin -medicines for high blood pressure -medicines for mental depression -other medicines used to treat angina -phenothiazines like chlorpromazine, mesoridazine, prochlorperazine, thioridazine This list may not describe all possible interactions. Give your health care provider a list of all the medicines, herbs, non-prescription drugs, or dietary supplements you use. Also tell them if you smoke, drink alcohol, or use illegal drugs. Some items may interact with your medicine.  What should I watch for while using this medicine? Tell  your doctor or health care professional if you feel your medicine is no longer working. Keep this medicine with you at all times. Sit or lie down when you take your medicine to prevent falling if you feel dizzy or faint after using it. Try to remain calm. This will help you to feel better faster. If you feel dizzy, take several deep breaths and lie down with your feet propped up, or bend forward with your head resting between your knees. You may get drowsy or dizzy. Do not drive, use machinery, or do anything that needs mental alertness until you know how this drug affects you. Do not stand or sit up quickly, especially if you are an older patient. This reduces the risk of dizzy or fainting spells. Alcohol can make you more drowsy and dizzy. Avoid alcoholic drinks. Do not treat yourself for coughs, colds, or pain while you are taking this medicine without asking your doctor or health care professional for advice. Some ingredients may increase your blood pressure.  What side effects may I notice from receiving this medicine?  Side effects that you should report to your doctor or health care professional as soon as possible: -blurred vision -dry mouth -skin rash -sweating -the feeling of extreme pressure in the head -unusually weak or tired Side effects that usually do not require medical attention (report to your doctor or health care professional if they continue or are bothersome): -flushing of the face or neck -headache -irregular heartbeat, palpitations -nausea, vomiting This list may not describe all possible side effects. Call your doctor for medical advice about side effects. You may report side effects to FDA at 1-800-FDA-1088.  Where should I keep my medicine? Keep out of the reach of children. Store at room temperature between 20 and 25 degrees C (68 and 77 degrees F). Store in Retail buyeroriginal container. Protect from light and moisture. Keep tightly closed. Throw away any unused medicine  after the expiration date. NOTE: This sheet is a summary. It may not cover all possible information. If you have questions about this medicine, talk to your doctor, pharmacist, or health care provider.  2017 Elsevier/Gold Standard (2012-12-05 17:57:36)

## 2016-03-29 NOTE — Progress Notes (Signed)
Cardiology Office Note    Date:  03/29/2016   ID:  Megan [name], DOB 1980-04-02, MRN [id number]  PCP:  Lonie PeakNathan Conroy, PA-C  Cardiologist:  New (discussed with DOD Dr. Allyson SabalBerry)   Chief Complaint  Patient presents with  . New Patient (Initial Visit)    chest pain with exertion    History of Present Illness:  Megan [name] is a 36 y.o. female with PMH of PCOS, Heptitis C, cirrhosis, HLD, bipolar disorder, PTSD, and tobacco dependence. She also have family history of early CAD with her father having stents in his 3350s. [facility]. During to recent office visit with her PCP on 02/15/2016, she was complaining of some chest discomfort with exertion, therefore she was referred to cardiology service for further evaluation.  According to the patient, she quit smoking a year ago. Her father and paternal grandfather did have early CAD diagnosed in their 5250s. For the past year, she has been noticing a sharp stabbing chest pain under her left breast during exertion. She has been trying to lose weight by riding bicycles. She noticed the chest discomfort more so with exertion. She also occasionally noticed at rest as well. The frequency of the chest discomfort has been increasing. Now it is occurring on a daily basis. The last episode of chest pain she had was last night which occurred at rest. She just had a recent echocardiogram on 03/07/2016 which showed EF 55-60%, grade 1 diastolic dysfunction. She does have exertional dyspnea as well, however has been treating this to her overweight. Otherwise, she says she was diagnosed with cirrhosis associated with hepatitis C last year, she has finished a full course of Harvoni for the hepatitis C.    Past Medical History:  Diagnosis Date  . GERD (gastroesophageal reflux disease)   . Hypercholesteremia     Past Surgical History:  Procedure Laterality Date  . HAND SURGERY    . TONSILLECTOMY       Current Medications: Outpatient Medications Prior to Visit  Medication Sig Dispense Refill  . clonazePAM (KLONOPIN) 0.5 MG tablet Take 0.5 mg by mouth daily as needed for anxiety. Reported on 06/01/2015    . DULoxetine (CYMBALTA) 60 MG capsule Take 60 mg by mouth daily. Reported on 06/01/2015    . escitalopram (LEXAPRO) 10 MG tablet Take 10 mg by mouth daily.    Marland Kitchen. omeprazole (PRILOSEC) 40 MG capsule Take 40 mg by mouth 2 (two) times daily. Reported on 06/01/2015    . prazosin (MINIPRESS) 2 MG capsule Take 2 mg by mouth at bedtime.    . traZODone (DESYREL) 50 MG tablet Take 50 mg by mouth See admin instructions. PT IS TO TAKE  MEDICATION AT BEDTIME, AND AS NEEDED     No facility-administered medications prior to visit.      Allergies:   Penicillins; Varenicline; and Penicillins   Social History   Social History  . Marital status: Single    Spouse name: N/A  . Number of children: N/A  . Years of education: N/A   Social History Main Topics  . Smoking status: Former Smoker    Packs/day: 0.50    Years: 15.00    Types: Cigarettes    Quit date: 03/30/2015  . Smokeless tobacco: Never Used  . Alcohol use Yes     Comment: 0.5 gallon every 2-3 days  . Drug use: No  . Sexual activity: Not Asked   Other Topics Concern  .  None   Social History Narrative   ** Merged History Encounter **         Family History:  The patient's family history includes Heart attack in her father and paternal grandfather; High blood pressure in her father and mother.   ROS:   Please see the history of present illness.    ROS All other systems reviewed and are negative.   PHYSICAL EXAM:   VS:  BP (!) 153/108 (BP Location: Left Arm)   Pulse 99   Ht 5\' 9"  (1.753 m)   Wt 288 lb (130.6 kg)   BMI 42.53 kg/m    GEN: Well nourished, well developed, in no acute distress  HEENT: normal  Neck: no JVD, carotid bruits, or masses Cardiac: RRR; no murmurs, rubs, or gallops,no edema  Respiratory:  clear to  auscultation bilaterally, normal work of breathing GI: soft, nontender, nondistended, + BS MS: no deformity or atrophy  Skin: warm and dry, no rash Neuro:  Alert and Oriented x 3, Strength and sensation are intact Psych: euthymic mood, full affect  Wt Readings from Last 3 Encounters:  03/29/16 288 lb (130.6 kg)      Studies/Labs Reviewed:   EKG:  EKG is ordered today.  The ekg ordered today demonstrates Normal sinus rhythm without significant ST-T wave changes  Recent Labs: 06/07/2015: Hemoglobin 13.2; Platelets 229   Lipid Panel No results found for: CHOL, TRIG, HDL, CHOLHDL, VLDL, LDLCALC, LDLDIRECT  Additional studies/ records that were reviewed today include:    Echo 03/07/2016 LV EF: 55% -   60%   - Left ventricle: The cavity size was normal. Wall thickness was   normal. Systolic function was normal. The estimated ejection   fraction was in the range of 55% to 60%. Wall motion was normal;   there were no regional wall motion abnormalities. Doppler   parameters are consistent with abnormal left ventricular   relaxation (grade 1 diastolic dysfunction).  Impressions:  - Normal LV systolic function; mild diastolic dysfunction.   ASSESSMENT:    1. Exertional chest pain   2. Hyperlipidemia, unspecified hyperlipidemia type   3. H/O hepatitis   4. Cirrhosis of liver without ascites, unspecified hepatic cirrhosis type (HCC)      PLAN:  In order of problems listed above:  1. Exertional chest pain: Also occurs at rest, however has more correlation with exertion. Has been ongoing for the past year, described as a sharp stabbing pain under the left breast. It is relieved with rest. Symptoms concerning for angina, her cardiac risk factors include obesity, former tobacco user, hyperlipidemia, and a family history of early CAD. I have discussed with the DOD Dr. Allyson Sabal, we'll pursue 2 day treadmill Myoview for now given concerning symptoms. I'll see her back in 2-4 weeks.  During the meantime, we will start her on baby aspirin and gave her sublingual nitroglycerin as needed. Prior to the test, we will obtain urine hCG to make sure she is not pregnant. Her blood pressure is somewhat elevated today, she does not have any peripheral edema. Recent echocardiogram is reassuring. We'll reassess on follow-up to see if she need any blood pressure medication.  2. Hyperlipidemia: Continue Lipitor 40 mg daily  3. Hepatitis C associated cirrhosis: Finished a full course of Harvoni    Medication Adjustments/Labs and Tests Ordered: Current medicines are reviewed at length with the patient today.  Concerns regarding medicines are outlined above.  Medication changes, Labs and Tests ordered today are listed in the Patient  Instructions below. Patient Instructions  Medication Instructions:   Your physician recommends that you continue on your current medications as directed. Please refer to the Current Medication list given to you today.   If you need a refill on your cardiac medications before your next appointment, please call your pharmacy.  Labwork:  PREGNANCY TEST    Testing/Procedures: Your physician has requested that you have en exercise stress myoview. For further information please visit [email or web address] Please follow instruction sheet, as given.     Follow-Up: IN 2 TO 4 WEEKS WITH Annikah Lovins   Any Other Special Instructions Will Be Listed Below (If Applicable).                                                                                                                                                      Ramond Dial, [place]  03/29/2016 9:26 AM    [facility] [address] [place], [place], [place]  [address] Phone: [phone]; Fax: [phone]

## 2016-03-31 ENCOUNTER — Telehealth (HOSPITAL_COMMUNITY): Payer: Self-pay

## 2016-03-31 NOTE — Telephone Encounter (Signed)
Encounter complete. 

## 2016-04-03 ENCOUNTER — Telehealth: Payer: Self-pay | Admitting: *Deleted

## 2016-04-03 DIAGNOSIS — Z01810 Encounter for preprocedural cardiovascular examination: Secondary | ICD-10-CM

## 2016-04-03 DIAGNOSIS — R079 Chest pain, unspecified: Secondary | ICD-10-CM

## 2016-04-03 NOTE — Telephone Encounter (Signed)
Pt states she got this test done same day as visit. Unsure what issue is, still showing needs collection. I'll contact [facility] tomorrow and see if we can get further information about this.

## 2016-04-03 NOTE — Telephone Encounter (Signed)
-----   Message from Deer ParkHao Meng, [place] sent at 04/03/2016  9:37 AM EST ----- Regarding: Check on lab Can you check with patient to see if she did the urine pregnancy test before her nuclear stress test, I still do not see the result  Signed, Azalee CourseHao Meng PA Pager: [phone]

## 2016-04-04 ENCOUNTER — Other Ambulatory Visit: Payer: Self-pay | Admitting: *Deleted

## 2016-04-04 DIAGNOSIS — R079 Chest pain, unspecified: Secondary | ICD-10-CM

## 2016-04-04 DIAGNOSIS — Z01810 Encounter for preprocedural cardiovascular examination: Secondary | ICD-10-CM

## 2016-04-04 NOTE — Telephone Encounter (Signed)
Left msg for [facility] to call.

## 2016-04-04 NOTE — Telephone Encounter (Signed)
[facility]. No record of patient coming for collection for this. They have checked back in their log, pt did not sign in or bring orders. I've reentered the order. Attempted to reach patient. Goes to voice mail, left her a message w instructions to return to lab site for collection of sample today - if any questions to call lab or call our office.

## 2016-04-04 NOTE — Telephone Encounter (Signed)
Thank you, nathan, I just want to be cautious before she undergo nuclear study since she is in the age group

## 2016-04-05 ENCOUNTER — Inpatient Hospital Stay (HOSPITAL_COMMUNITY): Admission: RE | Admit: 2016-04-05 | Payer: Medicaid Other | Source: Ambulatory Visit

## 2016-04-05 NOTE — Telephone Encounter (Signed)
Looks like she called back and cancelled myoview I discussed w Wynema BirchHao - will find out if patient wishes to reschedule and if so will give her instructions on tests, labs.  I've left pt msg to call.

## 2016-04-06 ENCOUNTER — Inpatient Hospital Stay (HOSPITAL_COMMUNITY): Admission: RE | Admit: 2016-04-06 | Payer: Self-pay | Source: Ambulatory Visit

## 2016-04-14 ENCOUNTER — Ambulatory Visit: Payer: Medicaid Other | Admitting: Physician Assistant

## 2016-04-14 ENCOUNTER — Telehealth (HOSPITAL_COMMUNITY): Payer: Self-pay

## 2016-04-14 NOTE — Telephone Encounter (Signed)
Encounter complete. 

## 2016-04-18 ENCOUNTER — Ambulatory Visit (HOSPITAL_COMMUNITY)
Admission: RE | Admit: 2016-04-18 | Discharge: 2016-04-18 | Disposition: A | Discharge status: Home / Self Care | Payer: Medicaid Other | Source: Ambulatory Visit | Attending: Cardiovascular Disease | Admitting: Cardiovascular Disease

## 2016-04-18 DIAGNOSIS — R0609 Other forms of dyspnea: Secondary | ICD-10-CM | POA: Diagnosis not present

## 2016-04-18 DIAGNOSIS — R0789 Other chest pain: Secondary | ICD-10-CM | POA: Insufficient documentation

## 2016-04-18 DIAGNOSIS — Z8619 Personal history of other infectious and parasitic diseases: Secondary | ICD-10-CM | POA: Insufficient documentation

## 2016-04-18 DIAGNOSIS — R079 Chest pain, unspecified: Secondary | ICD-10-CM

## 2016-04-18 DIAGNOSIS — Z87891 Personal history of nicotine dependence: Secondary | ICD-10-CM | POA: Insufficient documentation

## 2016-04-18 DIAGNOSIS — E669 Obesity, unspecified: Secondary | ICD-10-CM | POA: Insufficient documentation

## 2016-04-18 DIAGNOSIS — Z6841 Body Mass Index (BMI) 40.0 and over, adult: Secondary | ICD-10-CM | POA: Insufficient documentation

## 2016-04-18 DIAGNOSIS — K7460 Unspecified cirrhosis of liver: Secondary | ICD-10-CM | POA: Insufficient documentation

## 2016-04-18 DIAGNOSIS — Z8249 Family history of ischemic heart disease and other diseases of the circulatory system: Secondary | ICD-10-CM | POA: Diagnosis not present

## 2016-04-18 IMAGING — NM NM MISC PROCEDURE
9 series · 54 of 54 positions shown · non-contrast
Comparison: none

[Series 1: stress sax · 6.4mm · 6.40mm/px · 6 of 23 frames shown]
[frame 2/23]
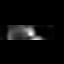
[frame 6/23]
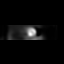
[frame 10/23]
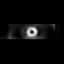
[frame 14/23]
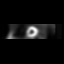
[frame 18/23]
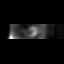
[frame 22/23]
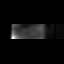

[Series 1: wbr stress-gsp · 6.40mm/px · 6 of 476 frames shown]
[frame 40/476  full-range]
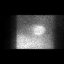
[frame 119/476  full-range]
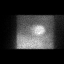
[frame 199/476  full-range]
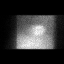
[frame 278/476  full-range]
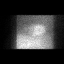
[frame 357/476  full-range]
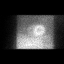
[frame 437/476  full-range]
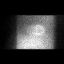

[Series 1: stress sax gs · 6.4mm · 6.40mm/px · 6 of 184 frames shown]
[frame 16/184]
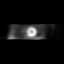
[frame 46/184]
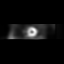
[frame 77/184]
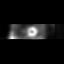
[frame 108/184]
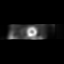
[frame 138/184]
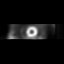
[frame 169/184]
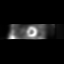

[Series 1: wbr_s-proj_st wbr stress-gsp · 6.40mm/px · 6 of 512 frames shown]
[frame 43/512]
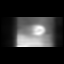
[frame 128/512]
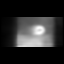
[frame 214/512]
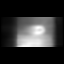
[frame 299/512]
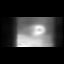
[frame 384/512]
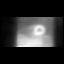
[frame 470/512]
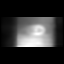

[Series 2: wbr stress-sum-em · 6.40mm/px · 6 of 61 frames shown]
[frame 6/61]
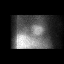
[frame 16/61]
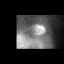
[frame 26/61]
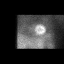
[frame 36/61]
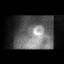
[frame 46/61]
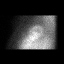
[frame 56/61]
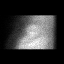

[Series 2: wbr_s-proj_st wbr stress-sum-em · 6.40mm/px · 6 of 64 frames shown]
[frame 6/64]
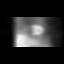
[frame 16/64]
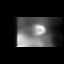
[frame 27/64]
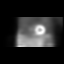
[frame 38/64]
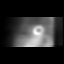
[frame 48/64]
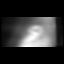
[frame 59/64]
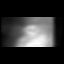

[Series 3: wbr rest · 6.40mm/px · 6 of 64 frames shown]
[frame 6/64]
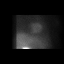
[frame 16/64]
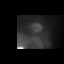
[frame 27/64]
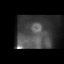
[frame 38/64]
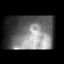
[frame 48/64]
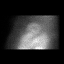
[frame 59/64]
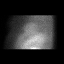

[Series 3: rest sax · 6.4mm · 6.40mm/px · 6 of 25 frames shown]
[frame 3/25]
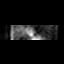
[frame 7/25]
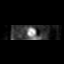
[frame 11/25]
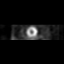
[frame 15/25]
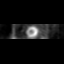
[frame 19/25]
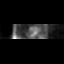
[frame 23/25]
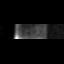

[Series 3: wbr_r-proj_st wbr rest · 6.40mm/px · 6 of 64 frames shown]
[frame 6/64]
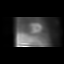
[frame 16/64]
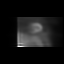
[frame 27/64]
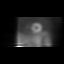
[frame 38/64]
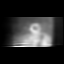
[frame 48/64]
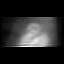
[frame 59/64]
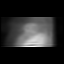

[54 of 54 positions shown; findings below may reference images not displayed]

Canned report from images found in remote index.

Refer to host system for actual result text.

## 2016-04-18 MED ORDER — TECHNETIUM TC 99M TETROFOSMIN IV KIT
30.9000 | PACK | Freq: Once | INTRAVENOUS | Status: AC | PRN
  Administered 2016-04-18: 30.9 via INTRAVENOUS
  Filled 2016-04-18: qty 31

## 2016-04-19 ENCOUNTER — Ambulatory Visit (HOSPITAL_COMMUNITY)
Admission: RE | Admit: 2016-04-19 | Discharge: 2016-04-19 | Disposition: A | Discharge status: Home / Self Care | Payer: Medicaid Other | Source: Ambulatory Visit | Attending: Cardiovascular Disease | Admitting: Cardiovascular Disease

## 2016-04-19 LAB — MYOCARDIAL PERFUSION IMAGING
CHL CUP MPHR: 184 {beats}/min
CHL CUP NUCLEAR SDS: 0
CSEPHR: 90 %
CSEPPHR: 166 {beats}/min
Estimated workload: 7 METS
Exercise duration (min): 6 min
Exercise duration (sec): 31 s
LV sys vol: 33 mL
LVDIAVOL: 73 mL (ref 46–106)
NUC STRESS TID: 0.95
RPE: 18
Rest HR: 75 {beats}/min
SRS: 0
SSS: 0

## 2016-04-19 LAB — PREGNANCY, URINE: PREG TEST UR: NEGATIVE

## 2016-04-19 MED ORDER — TECHNETIUM TC 99M TETROFOSMIN IV KIT
30.5000 | PACK | Freq: Once | INTRAVENOUS | Status: AC | PRN
  Administered 2016-04-19: 30.5 via INTRAVENOUS

## 2016-04-20 ENCOUNTER — Encounter: Payer: Self-pay | Admitting: Physician Assistant

## 2016-04-20 ENCOUNTER — Ambulatory Visit (INDEPENDENT_AMBULATORY_CARE_PROVIDER_SITE_OTHER): Payer: Medicaid Other | Admitting: Physician Assistant

## 2016-04-20 VITALS — BP 117/79 | HR 92 | Ht 69.0 in | Wt 287.2 lb

## 2016-04-20 DIAGNOSIS — R079 Chest pain, unspecified: Secondary | ICD-10-CM | POA: Diagnosis not present

## 2016-04-20 NOTE — Progress Notes (Signed)
Cardiology Office Note   Date:  04/20/2016   ID:  Megan [name], DOB November 06, 1980, MRN [id number]  PCP:  Megan PeakNathan Conroy, PA-C  Cardiologist:  New as of 03/29/2016, Dr. Allyson [name] will follow Megan [name] 03/29/2016  Barrett, Megan Loserhonda, PA-C    History of Present Illness: Megan [name] is a 36 y.o. female with a history of PCOS, Heptitis C, cirrhosis, HLD, bipolar disorder, PTSD, and tobacco dependence. She also have family history of early CAD with her father having stents in his 6550s.  03/29/2016 office visit with Megan [name] per cardiology evaluation, Myoview planned 04/18/2016 Myoview performed, no scar no ischemia and EF normal, low risk study  Megan [name] presents for cardiology follow up.  She feels like she is doing alright. She still gets the sharp pains. She may get them 3-4 x day. She will get them when she is riding her bicycle, but also will get them at rest.   They are underneath her L breast and above the breast as well. The pain will last about 10 minutes. She has GERD sx but it does not feel like that.   She has not tried any medications, the pain does not last long enough. She is riding a bicycle for transportation, she has to slow down if she gets a pain, but does not have to stop. The pain will go away and she can keep going. The pain has scared her because of its severity, and because of the family history of premature coronary artery disease in her father.  She has recently quit smoking.    Past Medical History:  Diagnosis Date  . GERD (gastroesophageal reflux disease)   . Hypercholesteremia     Past Surgical History:  Procedure Laterality Date  . HAND SURGERY    . TONSILLECTOMY      Current Outpatient Prescriptions  Medication Sig Dispense Refill  . aspirin EC 81 MG tablet Take 1 tablet (81 mg total) by mouth daily. 30 tablet 0  . atorvastatin (LIPITOR) 40 MG tablet Take 40 mg by mouth daily.    . nitroGLYCERIN (NITROSTAT) 0.4 MG SL tablet Place 1 tablet (0.4 mg  total) under the tongue every 5 (five) minutes as needed for chest pain. 25 tablet 3  . omeprazole (PRILOSEC) 20 MG capsule Take 20 mg by mouth 2 (two) times daily before a meal.     No current facility-administered medications for this visit.     Allergies:   Penicillins and Varenicline    Social History:  The patient  reports that she quit smoking about 12 months ago. Her smoking use included Cigarettes. She has a 7.50 pack-year smoking history. She has never used smokeless tobacco. She reports that she drinks alcohol. She reports that she does not use drugs.   Family History:  The patient's family history includes Heart attack in her father and paternal grandfather; High blood pressure in her father and mother.    ROS:  Please see the history of present illness. All other systems are reviewed and negative.    PHYSICAL EXAM: VS:  BP 117/79   Pulse 92   Ht 5\' 9"  (1.753 m)   Wt 287 lb 3.2 oz (130.3 kg)   LMP 10/22/2015 (Approximate)   SpO2 98%   BMI 42.41 kg/m  , BMI Body mass index is 42.41 kg/m. GEN: Well nourished, well developed, female in no acute distress  HEENT: normal for age  Neck: no JVD, no carotid bruit, no masses Cardiac: RRR; no murmur,  no rubs, or gallops Respiratory:  clear to auscultation bilaterally, normal work of breathing GI: soft, nontender, nondistended, + BS MS: no deformity or atrophy; no edema; distal pulses are 2+ in all 4 extremities   Skin: warm and dry, no rash Neuro:  Strength and sensation are intact Psych: euthymic mood, full affect   EKG:  EKG is not ordered today.  ECHO: 03/07/2016 - Left ventricle: The cavity size was normal. Wall thickness was   normal. Systolic function was normal. The estimated ejection   fraction was in the range of 55% to 60%. Wall motion was normal;   there were no regional wall motion abnormalities. Doppler   parameters are consistent with abnormal left ventricular   relaxation (grade 1 diastolic  dysfunction). Impressions: - Normal LV systolic function; mild diastolic dysfunction.  MYOVIEW 04/18/2016  The left ventricular ejection fraction is normal (55-65%).  Nuclear stress EF: 55%.  Blood pressure demonstrated a normal response to exercise.  No T wave inversion was noted during stress.  There was no ST segment deviation noted during stress.  This is a low risk study.  Normal perfusion. LVEF 55% with normal wall motion. This is a low risk study.   Recent Labs: 06/07/2015: Hemoglobin 13.2; Platelets 229    Lipid Panel No results found for: CHOL, TRIG, HDL, CHOLHDL, VLDL, LDLCALC, LDLDIRECT   Wt Readings from Last 3 Encounters:  04/20/16 287 lb 3.2 oz (130.3 kg)  04/18/16 288 lb (130.6 kg)  03/29/16 288 lb (130.6 kg)     Other studies Reviewed: Additional studies/ records that were reviewed today include: Office notes and testing.  ASSESSMENT AND PLAN:  1.  Chest pain: I reassured her that I felt comfortable she did not have significant coronary artery disease because her stress test was normal. I discussed the other possible causes for chest pain including musculoskeletal causes or GI. She has had GERD in the past but this does not feel like it.  I advised that it was acceptable to try medications for either GERD or muscle or skeletal pain when she gets symptoms to see if they help. It is okay for her to take to omeprazole tablets twice a day to see if this helped his. It is also okay to take some ibuprofen before she heads out on her bicycle to see if this will help prevent symptoms. Follow-up with her family physician if symptoms do not improve.   Current medicines are reviewed at length with the patient today.  The patient does not have concerns regarding medicines.  The following changes have been made:  Okay to increase omeprazole to 2 tablets twice a day  Labs/ tests ordered today include:  No orders of the defined types were placed in this  encounter.    Disposition:   FU with Dr. Allyson Sabal as needed  Signed, Leanna Battles  04/20/2016 3:21 PM    [facility] Phone: [phone]; Fax: [phone]  This note was written with the assistance of speech recognition software. Please excuse any transcriptional errors.

## 2016-04-20 NOTE — Patient Instructions (Signed)
Medication Instructions:  Per Bjorn LoserRhonda you can take Omeprazole 20mg  take 2 tabs by mouth twice a day as needed  Labwork: None   Testing/Procedures: None   Follow-Up: Your physician recommends that you schedule a follow-up appointment in: AS NEEDED WITH DR Allyson SabalBERRY  Any Other Special Instructions Will Be Listed Below (If Applicable).  FOLLOWUP WITH YOUR PCP IF YOUR SYMPTOMS DO NOT IMPROVE--OK TO TAKE IBUPROFEN OR MYLANTA FOR PAIN  If you need a refill on your cardiac medications before your next appointment, please call your pharmacy.

## 2016-05-01 ENCOUNTER — Other Ambulatory Visit: Payer: Self-pay | Admitting: Nurse Practitioner

## 2016-05-01 DIAGNOSIS — K7469 Other cirrhosis of liver: Secondary | ICD-10-CM

## 2016-05-08 IMAGING — US US ABDOMEN COMPLETE
1 series · 13 of 25 positions shown · non-contrast
Comparison: [DATE]

CLINICAL DATA: Hepatic cirrhosis

EXAM:
ABDOMEN ULTRASOUND COMPLETE

[Series 1: us abdomen complete · 0.16mm/px · 13 of 74 slices shown]
[im 1/74]
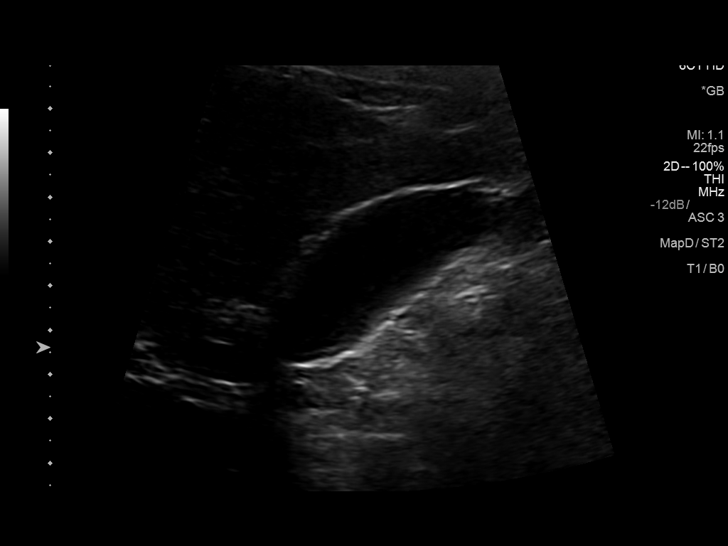
[im 7/74]
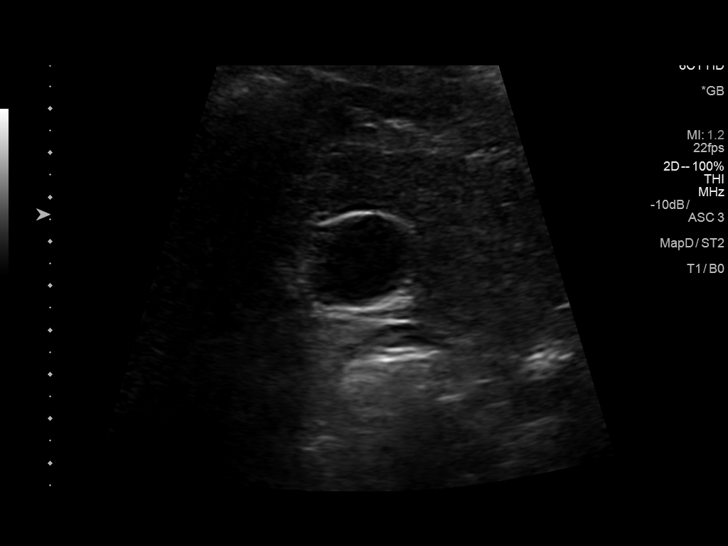
[im 13/74]
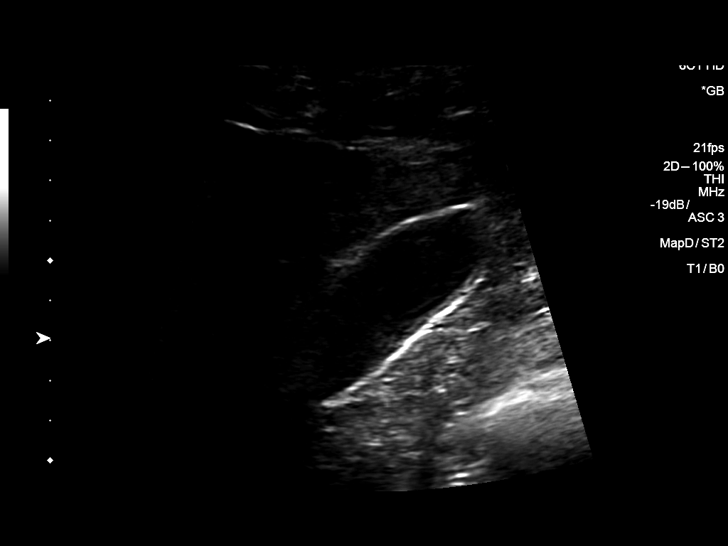
[im 19/74]
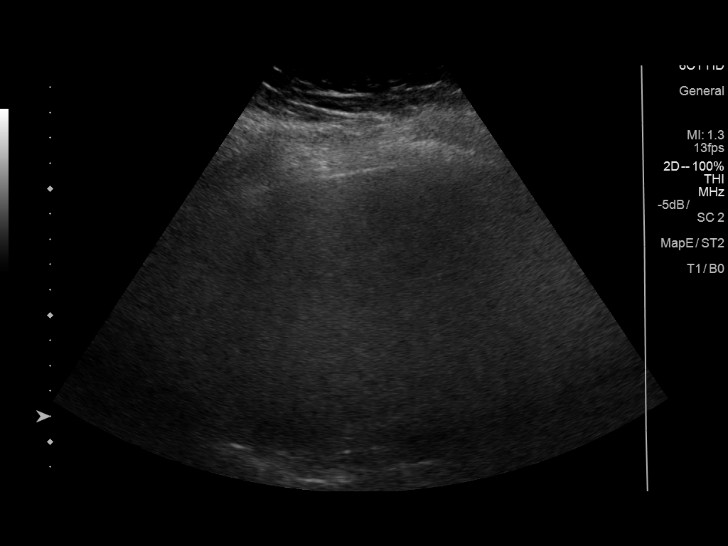
[im 25/74]
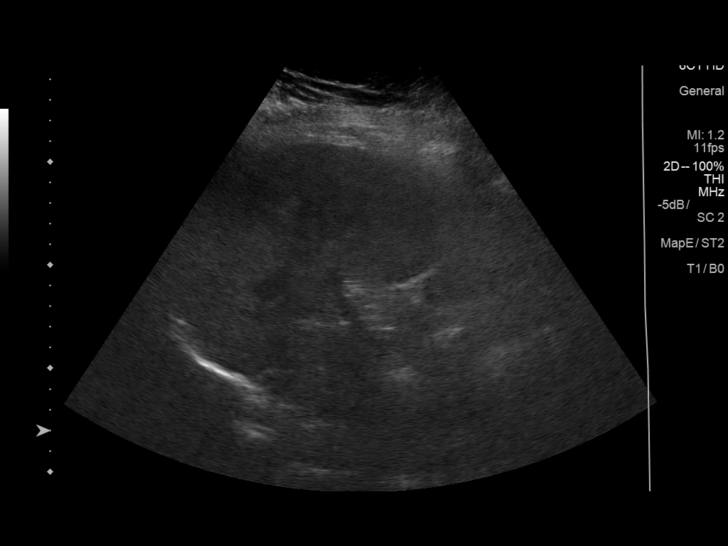
[im 31/74]
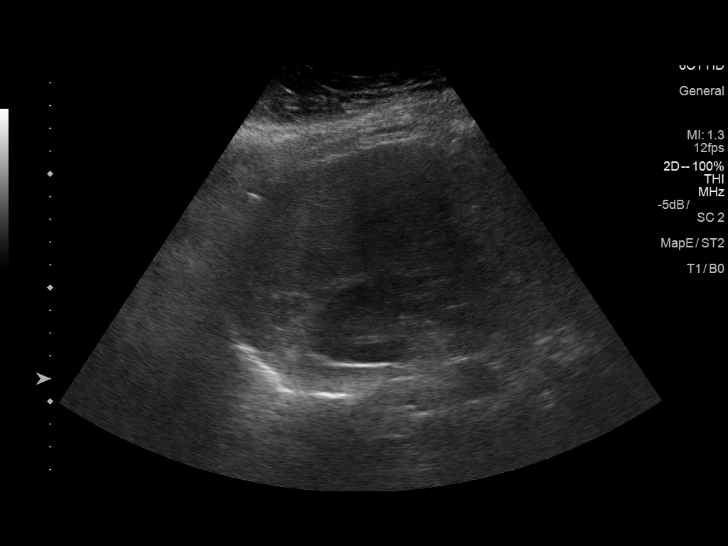
[im 37/74]
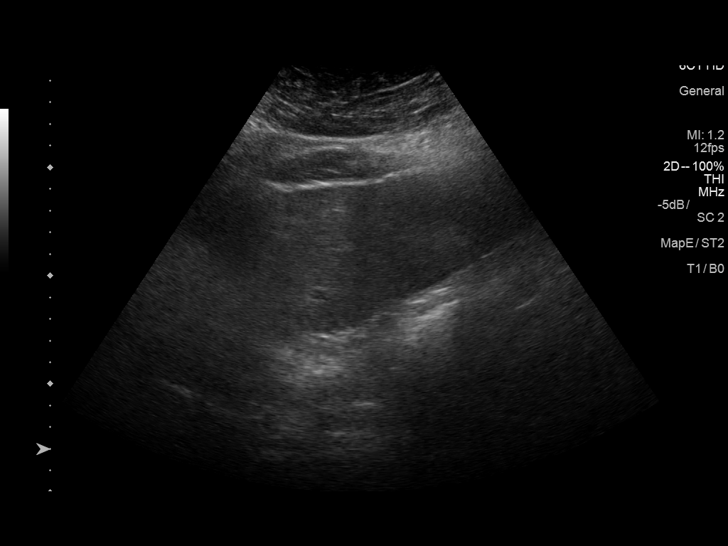
[im 43/74]
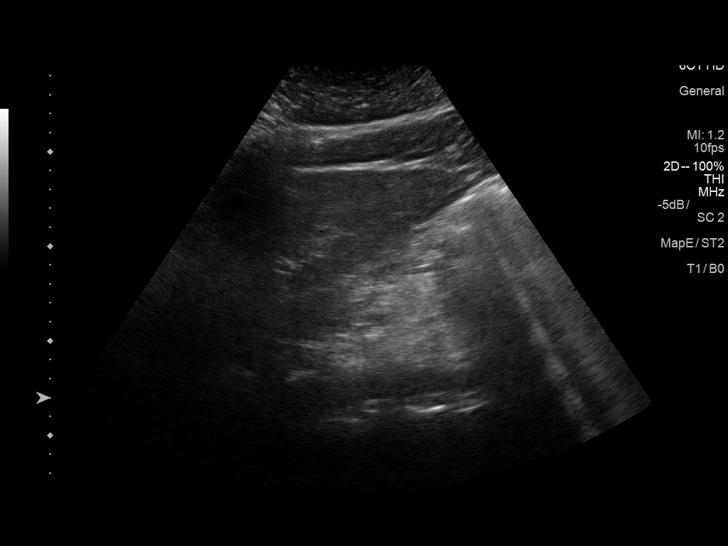
[im 49/74]
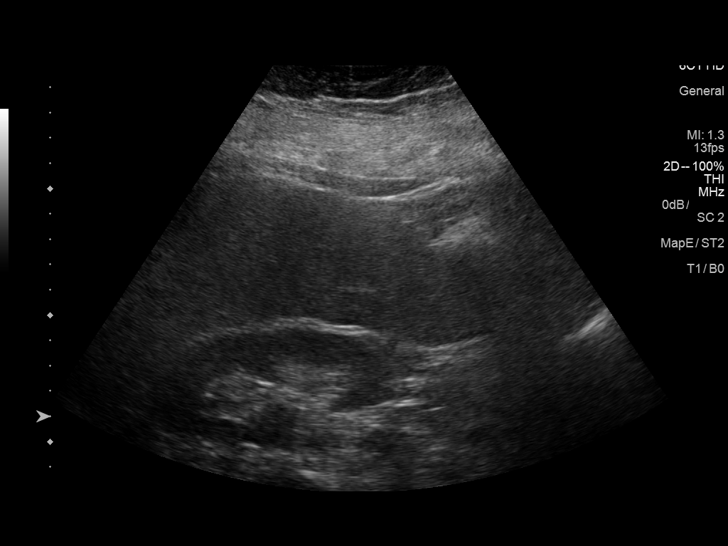
[im 55/74]
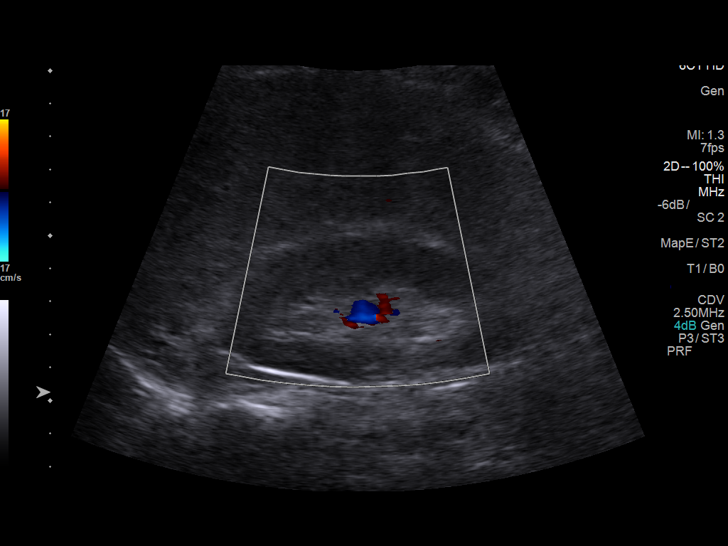
[im 61/74]
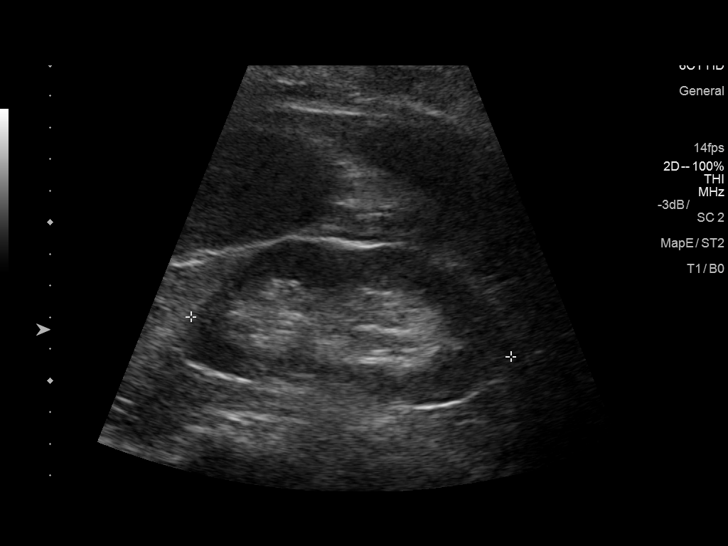
[im 67/74]
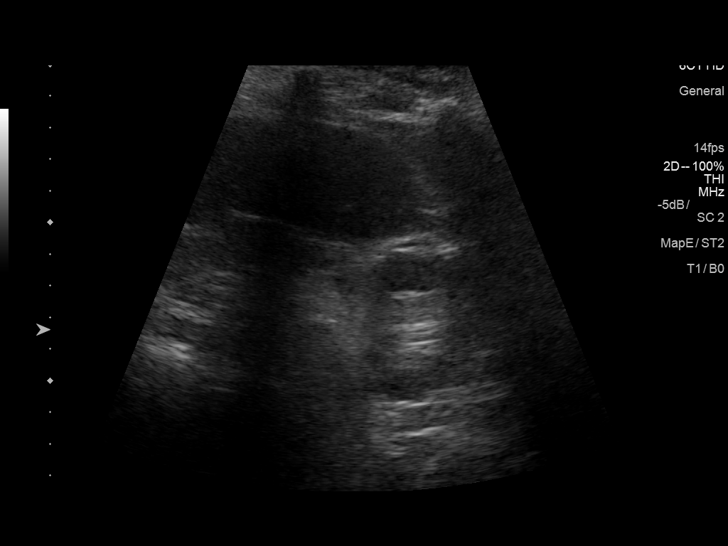
[im 74/74]
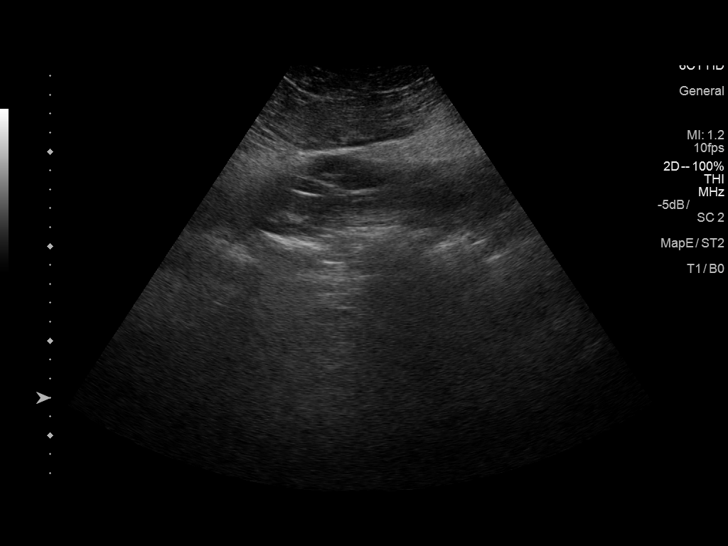

[13 of 25 positions shown; findings below may reference images not displayed]

FINDINGS: Gallbladder: No gallstones or wall thickening visualized. There is
no pericholecystic fluid. A small amount of sludge is noted in the
gallbladder. No sonographic Murphy sign noted by sonographer.

Common bile duct: Diameter: 3 mm. There is no intrahepatic, common
hepatic, or common bile duct dilatation.

Liver: No focal lesion identified. Liver echogenicity is diffusely
increased and somewhat inhomogeneous, similar to prior study.

IVC: No abnormality visualized. Portions of the infrahepatic
inferior vena cava obscured by gas.

Pancreas: Visualized portion unremarkable. Portions of pancreas
obscured by gas.

Spleen: Size and appearance within normal limits.

Right Kidney: Length: 10.0 cm. Echogenicity within normal limits. No
mass or hydronephrosis visualized.

Left Kidney: Length: 10.2 cm. Echogenicity within normal limits. No
mass or hydronephrosis visualized.

Abdominal aorta: No aneurysm visualized in visualized regions; much
of the aorta is obscured by gas.

Other findings: No demonstrable ascites.
IMPRESSION: The liver echogenicity is increased and inhomogeneous, consistent
with underlying parenchymal liver disease/ cirrhosis. Appearance is
similar prior study.

Mild sludge in gallbladder. No gallstones or gallbladder wall
thickening.

Portions of the aorta, inferior vena cava, and pancreas are obscured
by gas. Visualized portions of these structures appear unremarkable.

## 2016-05-24 ENCOUNTER — Other Ambulatory Visit: Payer: Self-pay

## 2016-05-30 ENCOUNTER — Ambulatory Visit
Admission: RE | Admit: 2016-05-30 | Discharge: 2016-05-30 | Disposition: A | Discharge status: Home / Self Care | Payer: Medicaid Other | Source: Ambulatory Visit | Attending: Nurse Practitioner | Admitting: Nurse Practitioner

## 2016-05-30 DIAGNOSIS — K7469 Other cirrhosis of liver: Secondary | ICD-10-CM

## 2016-12-04 ENCOUNTER — Other Ambulatory Visit: Payer: Self-pay | Admitting: Nurse Practitioner

## 2016-12-04 DIAGNOSIS — K7469 Other cirrhosis of liver: Secondary | ICD-10-CM

## 2016-12-06 ENCOUNTER — Other Ambulatory Visit: Payer: Self-pay

## 2016-12-06 IMAGING — US US ABDOMEN LIMITED
1 series · 14 of 25 positions shown · non-contrast
Comparison: [DATE]

CLINICAL DATA: 36-year-old female screening for HCC; cirrhosis

EXAM:
ULTRASOUND ABDOMEN LIMITED RIGHT UPPER QUADRANT

[Series 1: us abdomen limited · 0.28mm/px · 14 of 62 slices shown]
[im 1/62]
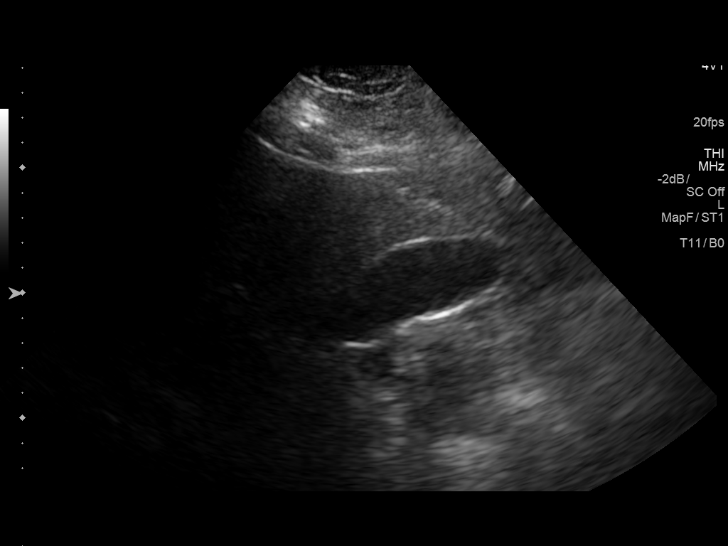
[im 6/62]
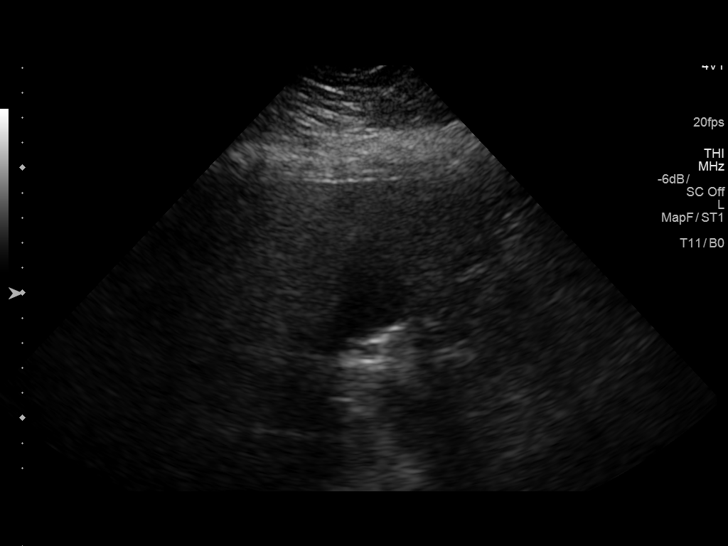
[im 11/62]
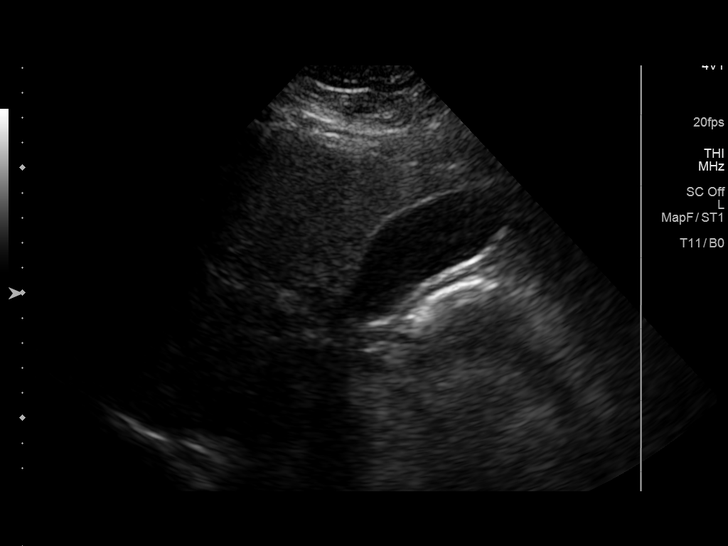
[im 16/62]
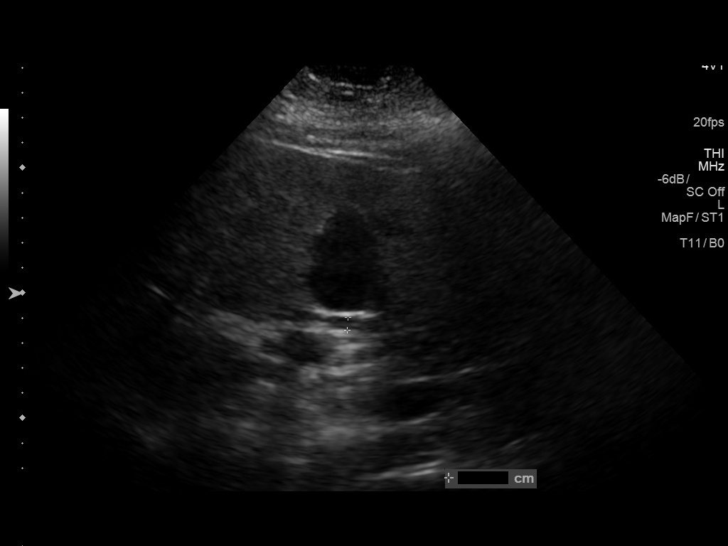
[im 21/62]
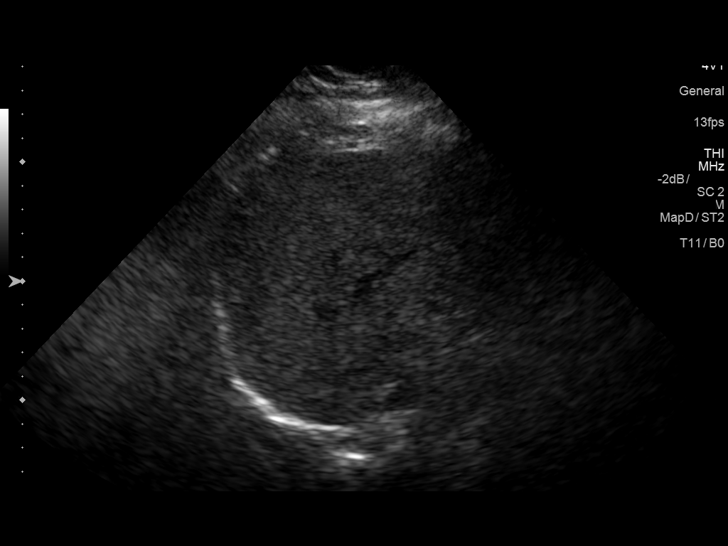
[im 23/62]
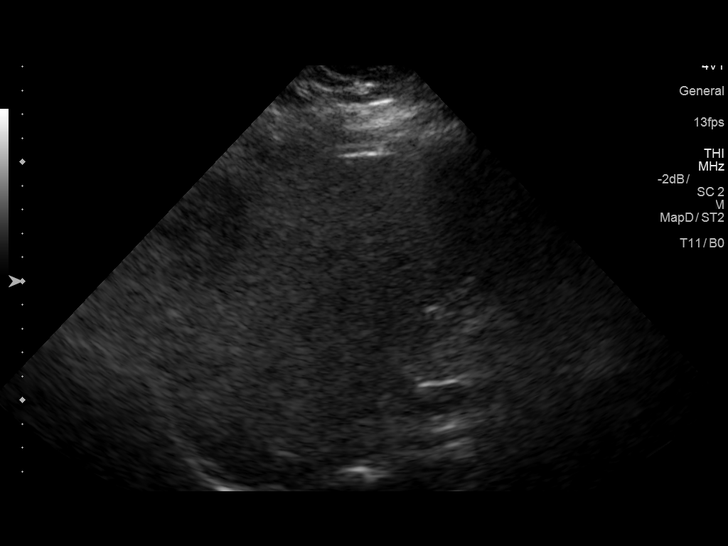
[im 28/62]
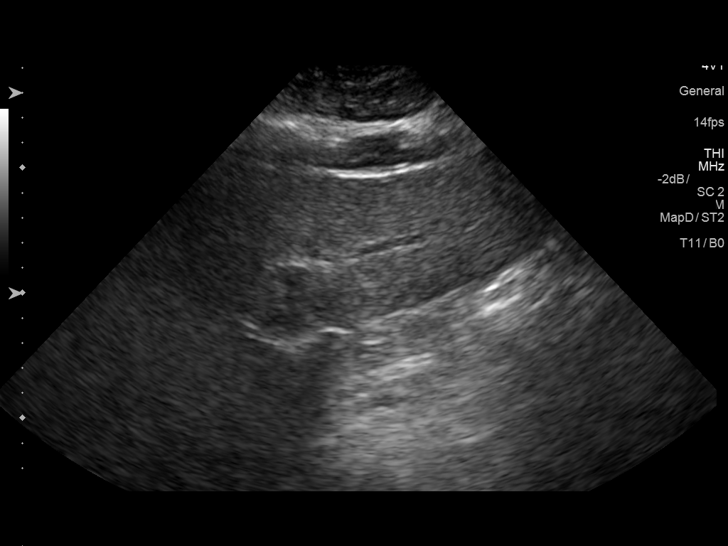
[im 34/62]
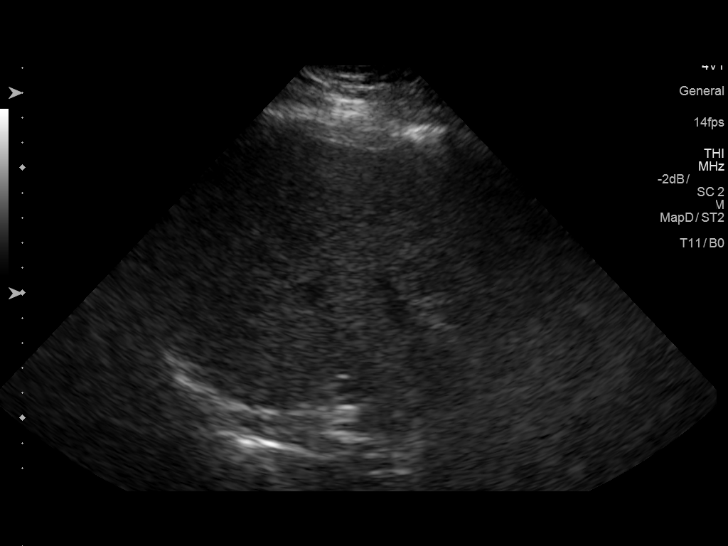
[im 39/62]
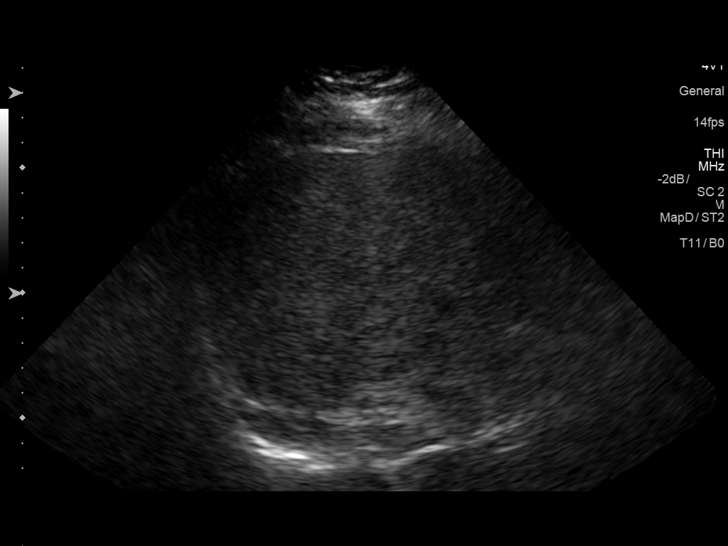
[im 41/62]
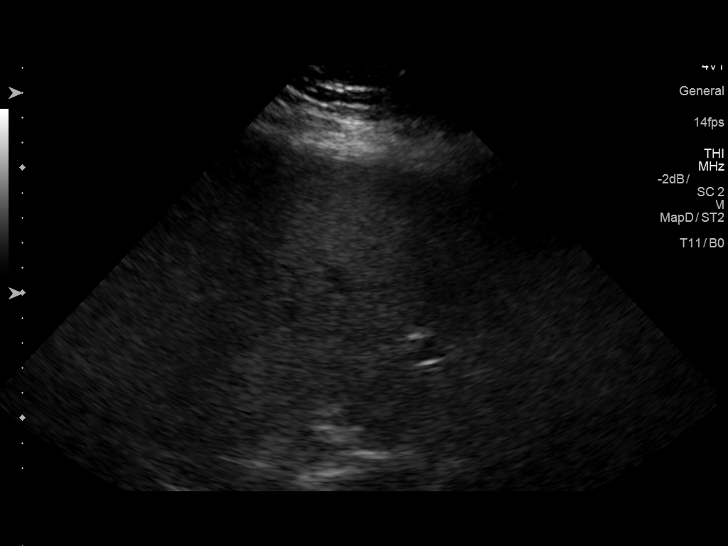
[im 46/62]
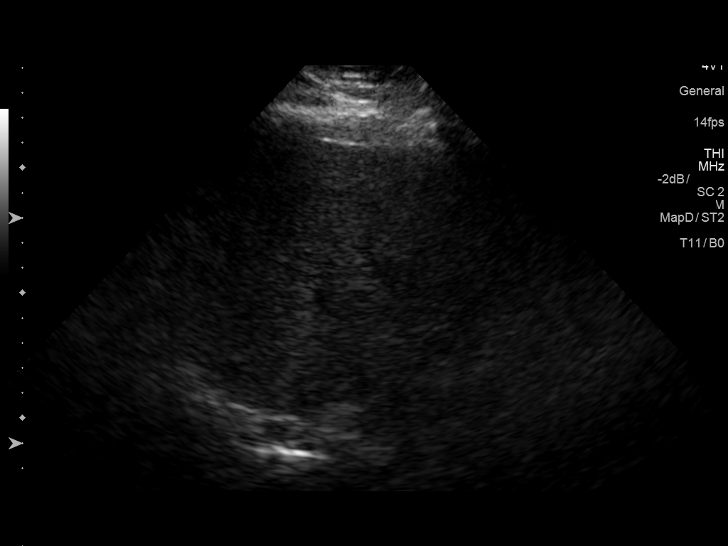
[im 51/62]
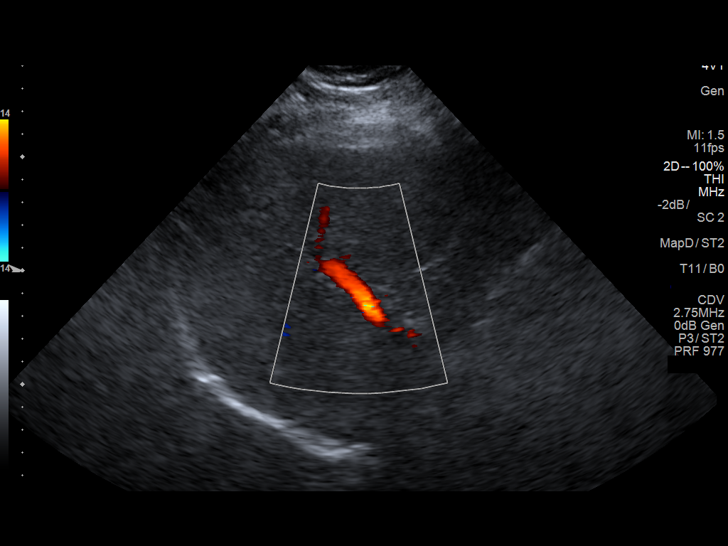
[im 56/62]
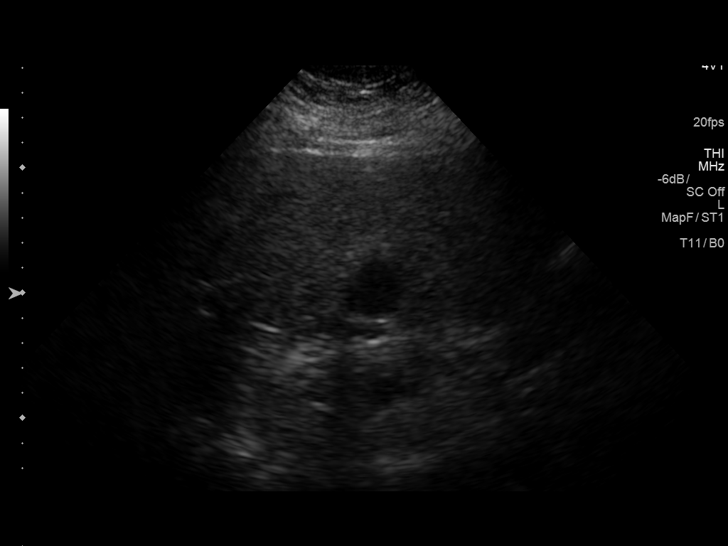
[im 62/62]
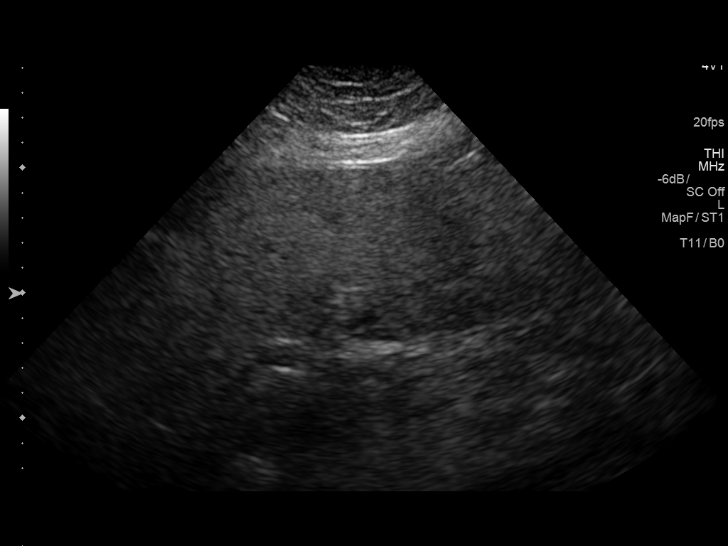

[14 of 25 positions shown; findings below may reference images not displayed]

FINDINGS: Gallbladder:

No gallstones or wall thickening visualized. Note is made of
intraluminal sludge. No sonographic Murphy sign noted by
sonographer.

Common bile duct:

Diameter: 5.7 mm, normal

Liver:

No focal lesion identified. Diffusely increased parenchymal
echogenicity. Portal vein is patent on color Doppler imaging with
normal direction of blood flow towards the liver.

Study is somewhat limited due to patient body habitus.
IMPRESSION: 1. Stable, increased echogenicity and heterogeneity of the liver,
again consistent with cirrhosis/parenchymal disease.
2. Gallbladder sludge without sonographic evidence for acute
cholecystitis.

## 2016-12-18 ENCOUNTER — Other Ambulatory Visit: Payer: Medicaid Other

## 2016-12-28 ENCOUNTER — Ambulatory Visit
Admission: RE | Admit: 2016-12-28 | Discharge: 2016-12-28 | Disposition: A | Discharge status: Home / Self Care | Payer: Medicaid Other | Source: Ambulatory Visit | Attending: Nurse Practitioner | Admitting: Nurse Practitioner

## 2016-12-28 DIAGNOSIS — K7469 Other cirrhosis of liver: Secondary | ICD-10-CM

## 2017-01-15 ENCOUNTER — Ambulatory Visit: Payer: Medicaid Other | Admitting: Gastroenterology

## 2017-01-15 ENCOUNTER — Encounter: Payer: Self-pay | Admitting: Gastroenterology

## 2017-01-15 VITALS — BP 112/74 | HR 78 | Ht 69.0 in | Wt 273.0 lb

## 2017-01-15 DIAGNOSIS — K219 Gastro-esophageal reflux disease without esophagitis: Secondary | ICD-10-CM | POA: Diagnosis not present

## 2017-01-15 DIAGNOSIS — K7469 Other cirrhosis of liver: Secondary | ICD-10-CM

## 2017-01-15 NOTE — Patient Instructions (Signed)
If you are age 37 or older, your body mass index should be between 23-30. Your Body mass index is 40.32 kg/m. If this is out of the aforementioned range listed, please consider follow up with your Primary Care Provider.  If you are age 37 or younger, your body mass index should be between 19-25. Your Body mass index is 40.32 kg/m. If this is out of the aformentioned range listed, please consider follow up with your Primary Care Provider.   You have been scheduled for an endoscopy. Please follow written instructions given to you at your visit today. If you use inhalers (even only as needed), please bring them with you on the day of your procedure. Your physician has requested that you go to www.startemmi.com and enter the access code given to you at your visit today. This web site gives a general overview about your procedure. However, you should still follow specific instructions given to you by our office regarding your preparation for the procedure.  Thank you for choosing [facility]  Dr Amada JupiterHenry Danis III

## 2017-01-15 NOTE — Progress Notes (Signed)
  [facility] Consult Note:  History: Megan [name] 01/15/2017  Referring physician: Dawn Drazek, NP  Reason for consult/chief complaint: Gastroesophageal Reflux (on omeprazole 20mg , helps); Dysphagia (solids, onset x months); and cirrhoisis (liver care referred here for variceal screening)   Subjective  HPI:  This is a 36-year-old woman referred by the local hepatology clinic for history of cirrhosis and chronic diarrhea.  She was diagnosed with cirrhosis several years ago, apparently caused by a combination of chronic hepatitis C infection and alcohol abuse.  She significantly cut back her alcohol use, but has had a couple of relapses, most recently June of this year.  Her hepatitis C was cleared with [facility].  A biopsy years ago confirmed cirrhosis, and she is referred for consideration of variceal screening.  She apparently had a few months of diarrhea when she was last seen in hematology clinic in late September, but it is now resolved.  She had been having some intermittent chest pain and had a negative cardiac stress test in March of this year. Back he has had no history of GI bleeding, and according to the hepatology note, has early stage cirrhosis without coagulopathy or thrombocytopenia.  ROS:  Review of Systems  Constitutional: Negative for appetite change and unexpected weight change.  HENT: Negative for mouth sores and voice change.   Eyes: Negative for pain and redness.  Respiratory: Negative for cough and shortness of breath.   Cardiovascular: Negative for chest pain and palpitations.  Gastrointestinal:       Chronic heartburn under control with PPI, denies dysphagia  Genitourinary: Negative for dysuria and hematuria.  Musculoskeletal: Negative for arthralgias and myalgias.  Skin: Negative for pallor and rash.  Neurological: Negative for weakness and headaches.  Hematological: Negative for adenopathy.     Past Medical History: Past Medical  History:  Diagnosis Date  . Cirrhosis (HCC)   . Dyslipidemia   . GERD (gastroesophageal reflux disease)   . Hepatitis C    s/p treatment  . Hypercholesteremia   . Obesity   . PCOS (polycystic ovarian syndrome)   . PTSD (post-traumatic stress disorder)   . Steatosis      Past Surgical History: Past Surgical History:  Procedure Laterality Date  . HAND SURGERY Left   . KNEE ARTHROSCOPY Right   . LIVER BIOPSY    . TONSILLECTOMY       Family History: Family History  Problem Relation Age of Onset  . High blood pressure Mother   . Heart attack Father   . High blood pressure Father   . Heart attack Paternal Grandfather        50s  . Colon cancer Maternal Grandfather     Social History: Social History   Socioeconomic History  . Marital status: Single    Spouse name: None  . Number of children: 0  . Years of education: None  . Highest education level: None  Social Needs  . Financial resource strain: None  . Food insecurity - worry: None  . Food insecurity - inability: None  . Transportation needs - medical: None  . Transportation needs - non-medical: None  Occupational History  . Occupation: Bartender  Tobacco Use  . Smoking status: Former Smoker    Packs/day: 0.50    Years: 15.00    Pack years: 7.50    Types: Cigarettes    Last attempt to quit: 03/30/2015    Years since quitting: 1.8  . Smokeless tobacco: Never Used  Substance and Sexual Activity  .   Alcohol use: Yes    Comment: social  . Drug use: No  . Sexual activity: None  Other Topics Concern  . None  Social History Narrative   ** Merged History Encounter **      Moved here from [place] 12 years ago to be near family.  Allergies: Penicillin Outpatient Meds: Current Outpatient Medications  Medication Sig Dispense Refill  . aspirin EC 81 MG tablet Take 1 tablet (81 mg total) by mouth daily. 30 tablet 0  . atorvastatin (LIPITOR) 40 MG tablet Take 40 mg by mouth daily.    Marland Kitchen omeprazole  (PRILOSEC) 20 MG capsule Take 20 mg by mouth 2 (two) times daily before a meal.    . nitroGLYCERIN (NITROSTAT) 0.4 MG SL tablet Place 1 tablet (0.4 mg total) under the tongue every 5 (five) minutes as needed for chest pain. 25 tablet 3   No current facility-administered medications for this visit.       ___________________________________________________________________ Objective   Exam:  BP 112/74   Pulse 78   Ht 5' 9" (1.753 m)   Wt 273 lb (123.8 kg)   BMI 40.32 kg/m    General: this is a(n) obese and well-appearing woman  Eyes: sclera anicteric, no redness  ENT: oral mucosa moist without lesions, no cervical or supraclavicular lymphadenopathy, good dentition  CV: RRR without murmur, S1/S2, no JVD, no peripheral edema  Resp: clear to auscultation bilaterally, normal RR and effort noted  GI: soft, no tenderness, with active bowel sounds. No guarding or palpable organomegaly noted.,     Limited by BMI   skin; warm and dry, no rash or jaundice noted  Neuro: awake, alert and oriented x 3. Normal gross motor function and fluent speech, no asterixis  Labs:  CBC Latest Ref Rng & Units 06/07/2015 12/11/2014 11/12/2013  WBC 4.0 - 10.5 K/uL 8.0 6.7 8.5  Hemoglobin 12.0 - 15.0 g/dL 13.2 13.2 13.2  Hematocrit 36.0 - 46.0 % 41.0 39.0 37.8  Platelets 150 - 400 K/uL 229 236 324   CMP Latest Ref Rng & Units 12/11/2014 11/12/2013 09/05/2012  Glucose 65 - 99 mg/dL 115(H) 78 114(H)  BUN 6 - 20 mg/dL _0 Creatinine 0.44 - 1.00 mg/dL 0.64 0.69 0.83  Sodium 135 - 145 mmol/L 140 139 138  Potassium 3.5 - 5.1 mmol/L 3.3(L) 3.9 3.7  Chloride 101 - 111 mmol/L 110 104 102  CO2 22 - 32 mmol/L 20(L) 15(L) 17(L)  Calcium 8.9 - 10.3 mg/dL 8.9 8.8 8.6  Total Protein 6.5 - 8.1 g/dL 7.5 7.5 7.7  Total Bilirubin 0.3 - 1.2 mg/dL 0.7 0.4 0.3  Alkaline Phos 38 - 126 U/L 125 112 179(H)  AST 15 - 41 U/L 221(H) 44(H) 262(H)  ALT 14 - 54 U/L 96(H) 22 82(H)   From 08/14/16 [facility] ED visit (when  patient was seen for chest pain after alcohol binge) Results for orders placed or performed during the hospital encounter of 08/14/16  Comprehensive Metabolic Panel  Result Value Ref Range  Sodium 144 135 - 145 mmol/L  Potassium 3.6 3.5 - 5.0 mmol/L  Chloride 107 98 - 107 mmol/L  CO2 20.0 (L) 22.0 - 32.0 mmol/L  BUN 7 7 - 21 mg/dL  Creatinine 0.80 0.60 - 1.00 mg/dL  BUN/Creatinine Ratio 9  EGFR MDRD Non Af Amer >60 mL/min/1.14m  EGFR MDRD Af Amer >60 60 - 999 mL/min/1.767m Anion Gap 17 mmol/L  Glucose 105 74 - 106 mg/dL  Calcium 9.1 8.5 - 10.2  mg/dL  Albumin 4.6 3.4 - 5.0 g/dL  Total Protein 8.2 6.5 - 8.3 g/dL  Total Bilirubin 0.6 0.1 - 1.2 mg/dL  AST 45 (H) 14 - 38 U/L  ALT 59 (H) 15 - 48 U/L  Alkaline Phosphatase 136 (H) 38 - 126 U/L  Troponin I  Result Value Ref Range  Troponin I <0.034 <0.034 ng/mL  D-Dimer, Quantitative  Result Value Ref Range  D-Dimer <150 <230 ng/mL DDU  PT-INR  Result Value Ref Range  PT 12.7 10.3 - 13.3 sec  INR 1.08 Undefined  APTT  Result Value Ref Range  APTT 31.4 27.7 - 37.7 sec  Heparin Correlation <0.2  Lipase Level  Result Value Ref Range  Lipase 126 44 - 232 U/L  CBC w/ Differential  Result Value Ref Range  WBC 10.0 3.4 - 10.8 10*9/L  RBC 3.62 (L) 3.77 - 5.28 10*12/L  HGB 13.0 11.1 - 15.9 g/dL  HCT 37.4 34.0 - 46.6 %  MCV 103.3 (H) 79.0 - 97.0 fL  MCH 35.9 (H) 27.0 - 33.0 pg  MCHC 34.8 31.5 - 35.7 g/dL  RDW 15.3 12.3 - 15.4 %  MPV 8.9 (L) 9.0 - 12.0 fL  Platelet 252 155 - 379 10*9/L  Neutrophils % 57.3 %  Lymphocytes % 25.3 %  Monocytes % 8.3 %  Eosinophils % 8.6 %  Basophils % 0.5 %  Absolute Neutrophils 5.7 1.4 - 7.0 10*9/L  Absolute Lymphocytes 2.5 0.7 - 3.1 10*9/L  Absolute Monocytes 0.8 0.1 - 0.9 10*9/L  Absolute Eosinophils 0.9 (H) 0.0 - 0.4 10*9/L  Absolute Basophils 0.1 0.0 - 0.2 10*9/L   XR Chest PA and Lateral  Final Result  No active cardiopulmonary disease.    Assessment: Encounter Diagnoses  Name  Primary?  . Other cirrhosis of liver ([place]) Yes  . Morbid obesity ([place])   . Gastroesophageal reflux disease, esophagitis presence not specified     GERD symptoms under good control with medicine.  We discussed nonpharmacologic antireflux measures including dietary changes, elevating head of bed, and weight reduction.  She has been diagnosed with cirrhosis and should be screened for esophageal varices.  Plan:  Upper endoscopy.  She is agreeable after discussion of procedure and risks.  The benefits and risks of the planned procedure were described in detail with the patient or (when appropriate) their health care proxy.  Risks were outlined as including, but not limited to, bleeding, infection, perforation, adverse medication reaction leading to cardiac or pulmonary decompensation, or pancreatitis (if ERCP).  The limitation of incomplete mucosal visualization was also discussed.  No guarantees or warranties were given.   I agree with the provider at the hematology clinic that if there are no significant varices now, she should not need future screening unless she has a long lasting relapse with alcohol abuse that might cause worsened liver cirrhosis.  Thank you for the courtesy of this consult.  Please call me with any questions or concerns.  Nelida Meuse III  CC: Cyndi Bender, PA-C  [place], NP

## 2017-01-30 ENCOUNTER — Encounter: Payer: Medicaid Other | Admitting: Gastroenterology

## 2017-02-01 ENCOUNTER — Other Ambulatory Visit: Payer: Self-pay

## 2017-02-01 ENCOUNTER — Encounter: Payer: Self-pay | Admitting: Gastroenterology

## 2017-02-01 ENCOUNTER — Ambulatory Visit (AMBULATORY_SURGERY_CENTER): Payer: Medicaid Other | Admitting: Gastroenterology

## 2017-02-01 VITALS — BP 126/78 | HR 82 | Temp 96.6°F | Resp 16 | Ht 69.0 in | Wt 273.0 lb

## 2017-02-01 DIAGNOSIS — K7460 Unspecified cirrhosis of liver: Secondary | ICD-10-CM | POA: Diagnosis not present

## 2017-02-01 DIAGNOSIS — K2990 Gastroduodenitis, unspecified, without bleeding: Secondary | ICD-10-CM

## 2017-02-01 DIAGNOSIS — K2970 Gastritis, unspecified, without bleeding: Secondary | ICD-10-CM

## 2017-02-01 DIAGNOSIS — K2950 Unspecified chronic gastritis without bleeding: Secondary | ICD-10-CM | POA: Diagnosis not present

## 2017-02-01 MED ORDER — SODIUM CHLORIDE 0.9 % IV SOLN: 500.0000 mL | Freq: Once | INTRAVENOUS | Status: DC

## 2017-02-01 NOTE — Progress Notes (Signed)
Pt's states no medical or surgical changes since previsit or office visit. 

## 2017-02-01 NOTE — Progress Notes (Signed)
Called to room to assist during endoscopic procedure.  Patient ID and intended procedure confirmed with present staff. Received instructions for my participation in the procedure from the performing physician.  

## 2017-02-01 NOTE — Progress Notes (Signed)
Report given to PACU, vss 

## 2017-02-01 NOTE — Patient Instructions (Signed)
YOU HAD AN ENDOSCOPIC PROCEDURE TODAY AT THE [facility]:   Refer to the procedure report that was given to you for any specific questions about what was found during the examination.  If the procedure report does not answer your questions, please call your gastroenterologist to clarify.  If you requested that your care partner not be given the details of your procedure findings, then the procedure report has been included in a sealed envelope for you to review at your convenience later.  YOU SHOULD EXPECT: Some feelings of bloating in the abdomen. Passage of more gas than usual.  Walking can help get rid of the air that was put into your GI tract during the procedure and reduce the bloating. If you had a lower endoscopy (such as a colonoscopy or flexible sigmoidoscopy) you may notice spotting of blood in your stool or on the toilet paper. If you underwent a bowel prep for your procedure, you may not have a normal bowel movement for a few days.  Please Note:  You might notice some irritation and congestion in your nose or some drainage.  This is from the oxygen used during your procedure.  There is no need for concern and it should clear up in a day or so.  SYMPTOMS TO REPORT IMMEDIATELY:     Following upper endoscopy (EGD)  Vomiting of blood or coffee ground material  New chest pain or pain under the shoulder blades  Painful or persistently difficult swallowing  New shortness of breath  Fever of 100F or higher  Black, tarry-looking stools  For urgent or emergent issues, a gastroenterologist can be reached at any hour by calling [phone].   DIET:  We do recommend a small meal at first, but then you may proceed to your regular diet.  Drink plenty of fluids but you should avoid alcoholic beverages for 24 hours.  ACTIVITY:  You should plan to take it easy for the rest of today and you should NOT DRIVE or use heavy machinery until tomorrow (because of the sedation medicines  used during the test).    FOLLOW UP: Our staff will call the number listed on your records the next business day following your procedure to check on you and address any questions or concerns that you may have regarding the information given to you following your procedure. If we do not reach you, we will leave a message.  However, if you are feeling well and you are not experiencing any problems, there is no need to return our call.  We will assume that you have returned to your regular daily activities without incident.  If any biopsies were taken you will be contacted by phone or by letter within the next 1-3 weeks.  Please call us at [phone] if you have not heard about the biopsies in 3 weeks.    SIGNATURES/CONFIDENTIALITY: You and/or your care partner have signed paperwork which will be entered into your electronic medical record.  These signatures attest to the fact that that the information above on your After Visit Summary has been reviewed and is understood.  Full responsibility of the confidentiality of this discharge information lies with you and/or your care-partner.   Resume medications. Information given on Hiatal Hernia. 

## 2017-02-01 NOTE — Op Note (Signed)
[facility] Patient Name: Megan AmesRebecca [name] Procedure Date: 02/01/2017 8:01 AM MRN: [id number] Endoscopist: Sherilyn CooterHenry L. Myrtie Neitheranis , MD Age: [age] Referring MD:  Date of Birth: 04/06/1980 Gender: Female Account #: [id number] Procedure:                Upper GI endoscopy Indications:              Cirrhosis rule out esophageal varices Medicines:                Monitored Anesthesia Care Procedure:                Pre-Anesthesia Assessment:                           - Prior to the procedure, a History and Physical                            was performed, and patient medications and                            allergies were reviewed. The patient's tolerance of                            previous anesthesia was also reviewed. The risks                            and benefits of the procedure and the sedation                            options and risks were discussed with the patient.                            All questions were answered, and informed consent                            was obtained. Prior Anticoagulants: The patient has                            taken no previous anticoagulant or antiplatelet                            agents. ASA Grade Assessment: III - A patient with                            severe systemic disease. After reviewing the risks                            and benefits, the patient was deemed in                            satisfactory condition to undergo the procedure.                           After obtaining informed consent, the endoscope was  passed under direct vision. Throughout the                            procedure, the patient's blood pressure, pulse, and                            oxygen saturations were monitored continuously. The                            Model GIF-HQ190 [id number](SN#[id number]) scope was introduced                            through the mouth, and advanced to the second part                            of duodenum.  The upper GI endoscopy was                            accomplished without difficulty. The patient                            tolerated the procedure. Scope In: Scope Out: Findings:                 The examined esophagus was normal. In particular,                            no varices were seen.                           Diffuse atrophic mucosa was found in the gastric                            antrum. Biopsies were taken with a cold forceps for                            histology. (Sidney protocol).                           A small hiatal hernia was present.                           The cardia and gastric fundus were normal on                            retroflexion. Specifically, no gastric varices were                            seen.                           The examined duodenum was normal. Complications:            No immediate complications. Estimated Blood Loss:     Estimated blood loss was minimal. Impression:               - Normal  esophagus.                           - Gastric mucosal atrophy. Biopsied.                           - Small hiatal hernia.                           - Normal examined duodenum. Recommendation:           - Patient has a contact number available for                            emergencies. The signs and symptoms of potential                            delayed complications were discussed with the                            patient. Return to normal activities tomorrow.                            Written discharge instructions were provided to the                            patient.                           - Resume previous diet.                           - Continue present medications.                           - Await pathology results.                           - No need for future variceal screening since HCV                            cleared with therapy.Details in recent clinic                            consult note. Henry L. Myrtie Neither,  MD 02/01/2017 8:23:23 AM This report has been signed electronically.

## 2017-02-02 ENCOUNTER — Telehealth: Payer: Self-pay | Admitting: *Deleted

## 2017-02-02 NOTE — Telephone Encounter (Signed)
Left message on f/u call 

## 2017-02-02 NOTE — Telephone Encounter (Signed)
  Follow up Call-  Call back number 02/01/2017  Post procedure Call Back phone  # [phone]  Permission to leave phone message Yes  Some recent data might be hidden     Patient questions:  Do you have a fever, pain , or abdominal swelling? No. Pain Score  0 *  Have you tolerated food without any problems? Yes.    Have you been able to return to your normal activities? Yes.    Do you have any questions about your discharge instructions: Diet   No. Medications  No. Follow up visit  No.  Do you have questions or concerns about your Care? No.  Actions: * If pain score is 4 or above: No action needed, pain <4.

## 2017-02-11 ENCOUNTER — Encounter: Payer: Self-pay | Admitting: Gastroenterology

## 2017-05-04 ENCOUNTER — Other Ambulatory Visit: Payer: Self-pay | Admitting: Nurse Practitioner

## 2017-05-04 DIAGNOSIS — K7469 Other cirrhosis of liver: Secondary | ICD-10-CM

## 2017-05-29 IMAGING — US US ABDOMEN LIMITED
1 series · 14 of 25 positions shown · non-contrast
Comparison: [DATE]

CLINICAL DATA: [place] cirrhosis (HCC)

EXAM:
ULTRASOUND ABDOMEN LIMITED RIGHT UPPER QUADRANT

[Series 1: us abdomen limited · 0.23mm/px · 14 of 46 slices shown]
[im 1/46]
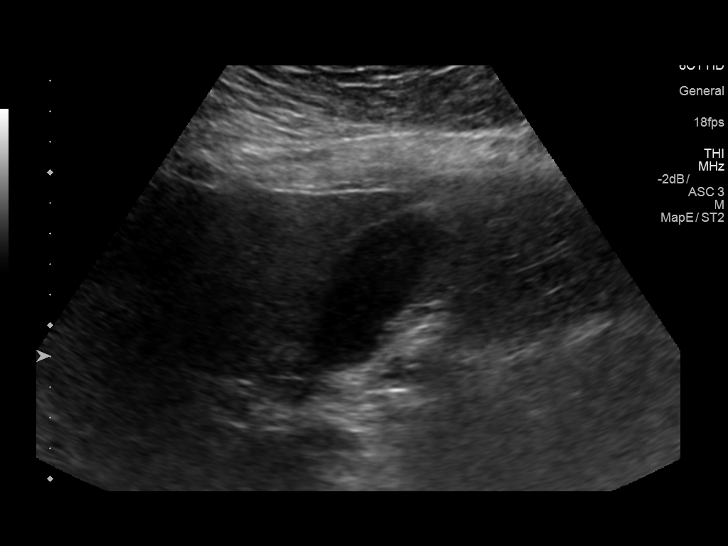
[im 4/46]
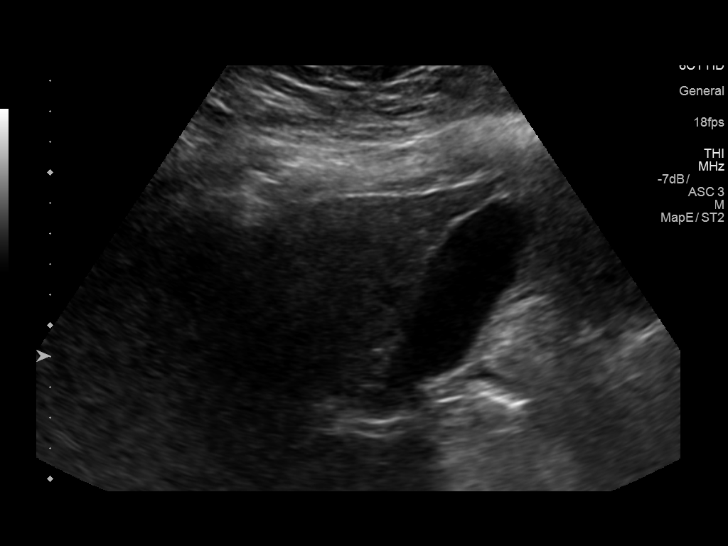
[im 8/46]
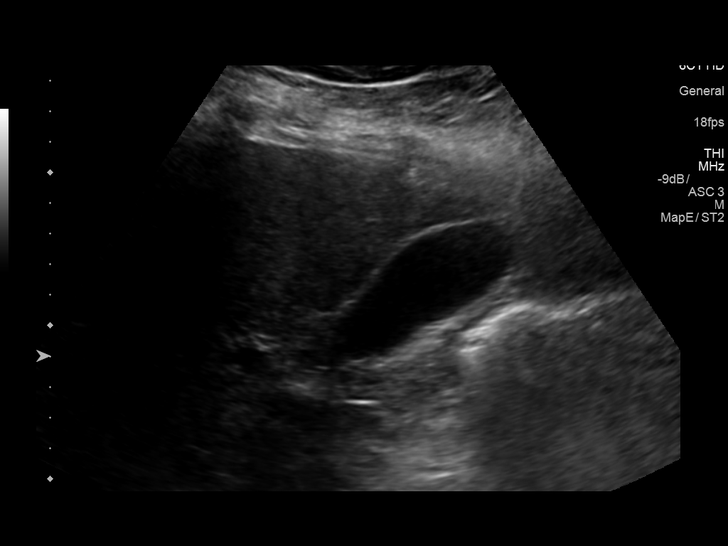
[im 12/46]
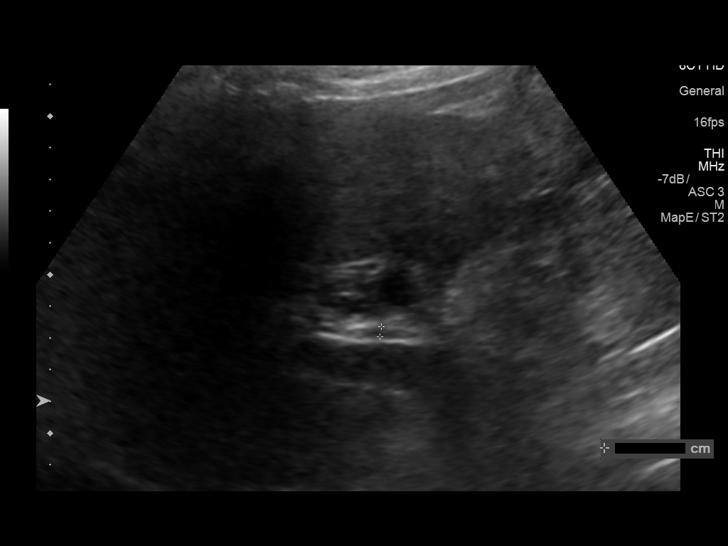
[im 16/46]
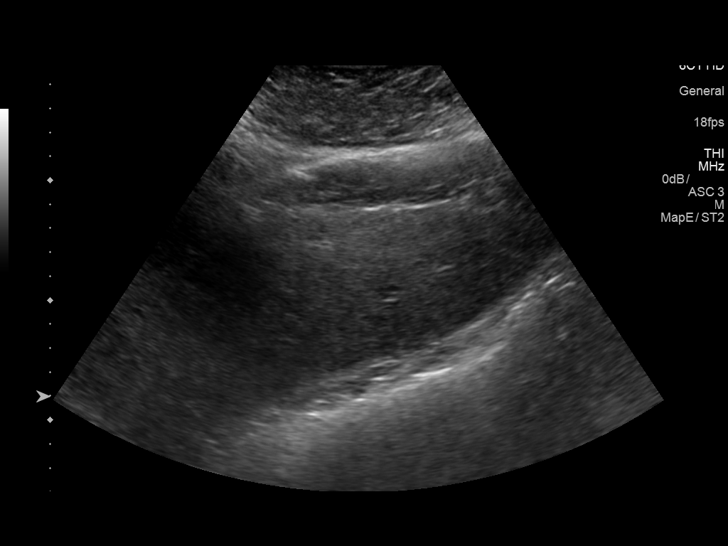
[im 17/46]
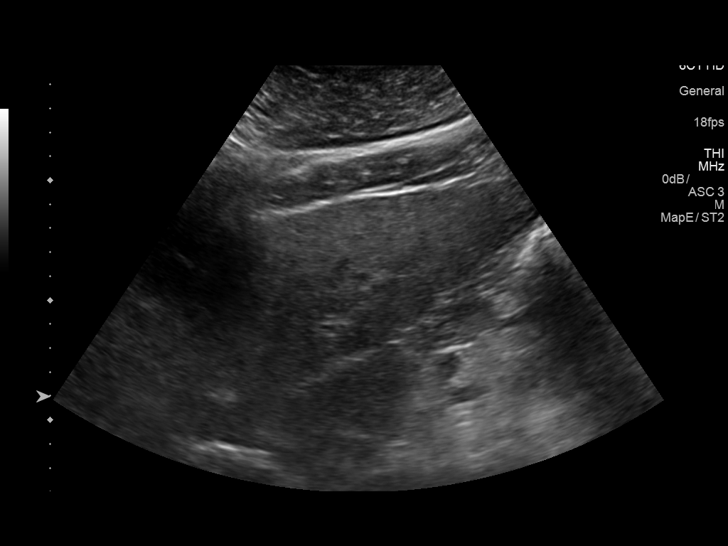
[im 21/46]
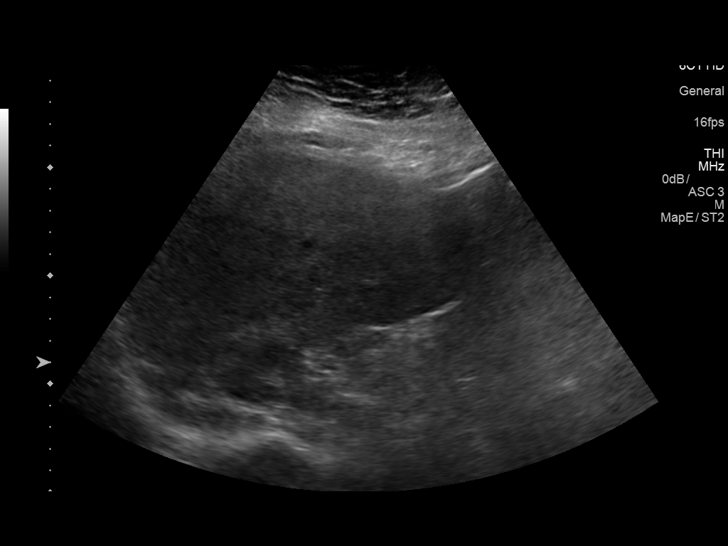
[im 25/46]
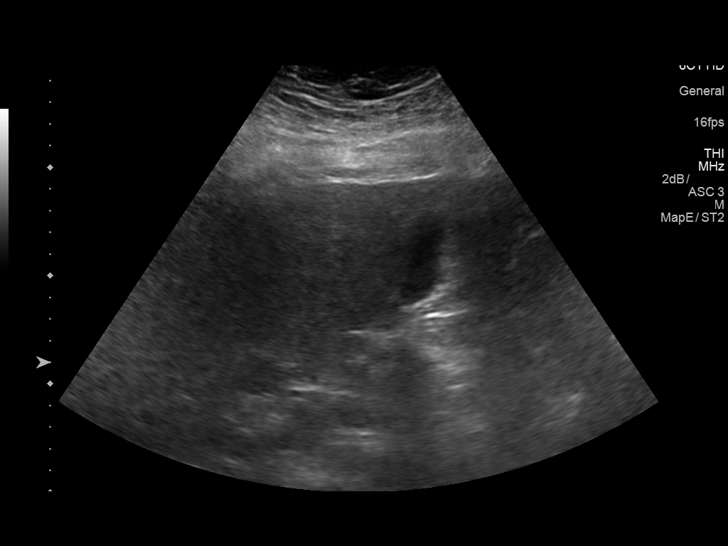
[im 29/46]
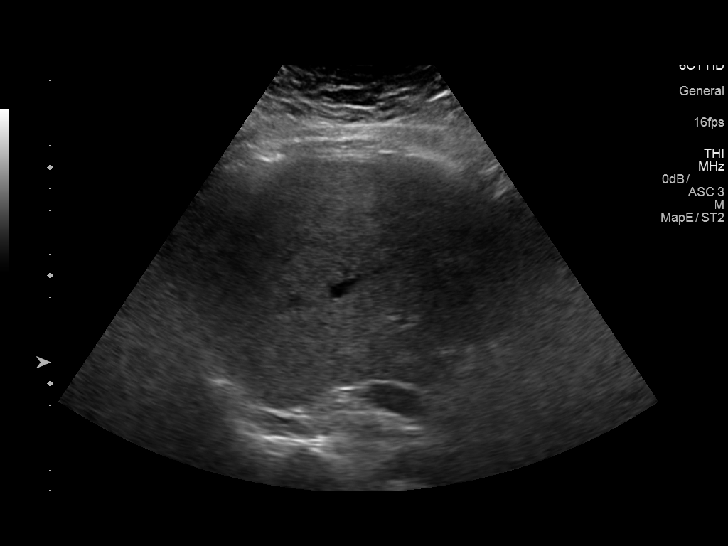
[im 31/46]
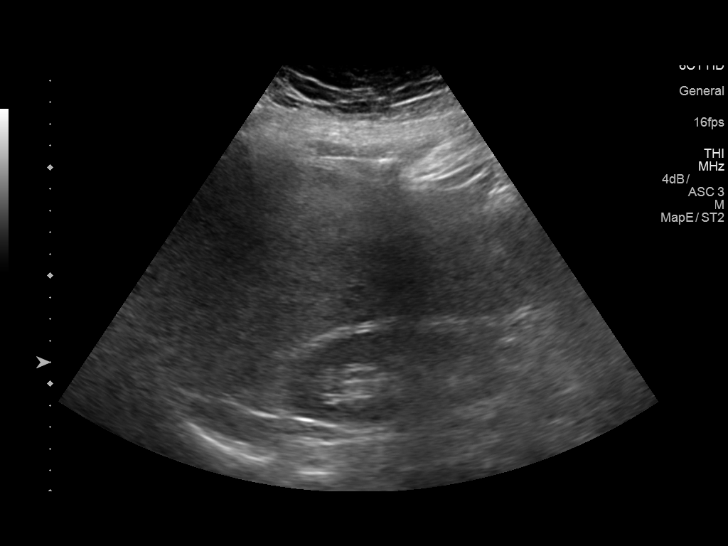
[im 34/46]
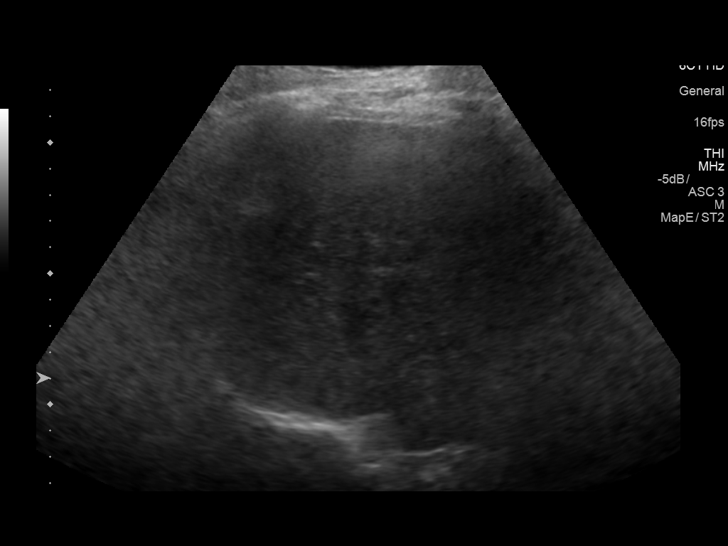
[im 38/46]
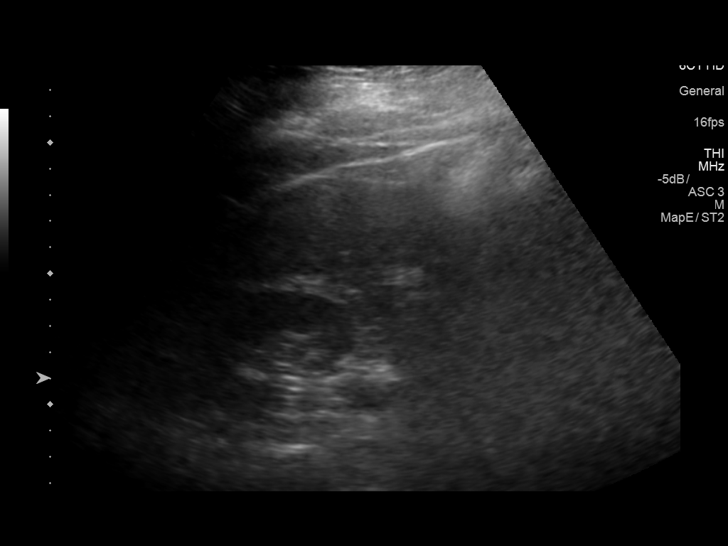
[im 42/46]
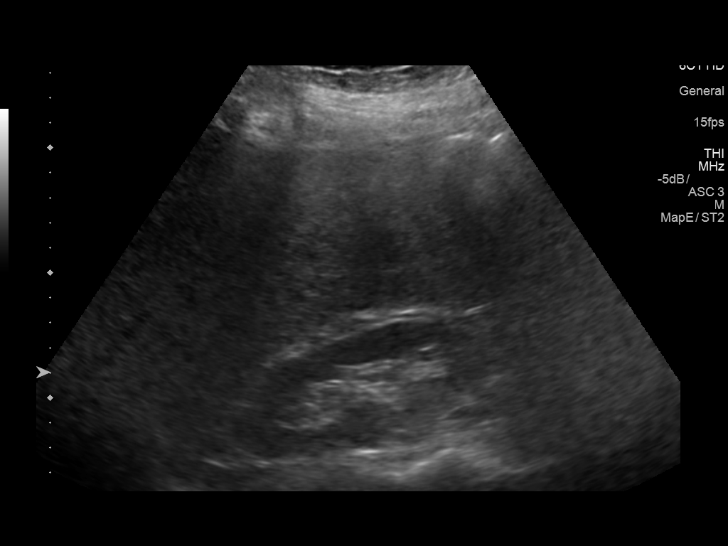
[im 46/46]
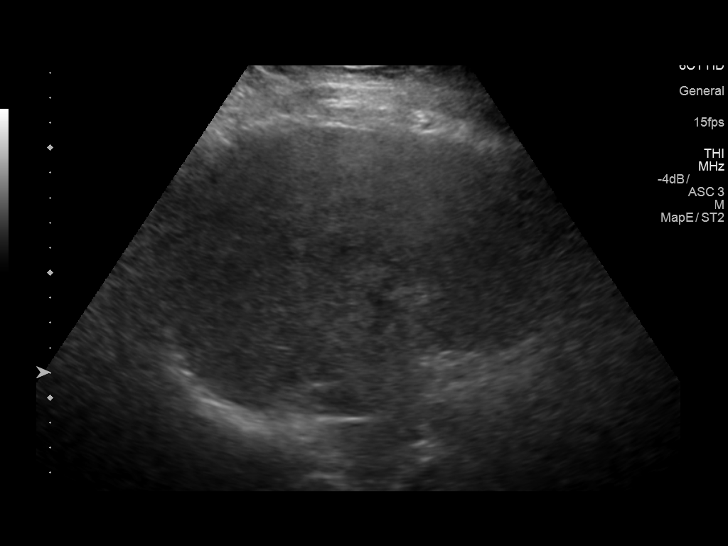

[14 of 25 positions shown; findings below may reference images not displayed]

FINDINGS: Gallbladder:

No gallstones or wall thickening visualized. No sonographic Murphy
sign noted by sonographer.

Common bile duct:

Diameter: 3.2 mm

Liver:

No focal hepatic mass. Coarse hepatic echotexture with increased
echogenicity and a mildly nodular contour most concerning for
cirrhosis. Portal vein is patent on color Doppler imaging with
normal direction of blood flow towards the liver.
IMPRESSION: 1. Cirrhosis.  No focal hepatic mass.

## 2017-06-20 ENCOUNTER — Ambulatory Visit
Admission: RE | Admit: 2017-06-20 | Discharge: 2017-06-20 | Disposition: A | Discharge status: Home / Self Care | Payer: Medicaid Other | Source: Ambulatory Visit | Attending: Nurse Practitioner | Admitting: Nurse Practitioner

## 2017-06-20 DIAGNOSIS — K7469 Other cirrhosis of liver: Secondary | ICD-10-CM

## 2017-09-25 ENCOUNTER — Emergency Department: Payer: Medicaid Other

## 2017-09-25 ENCOUNTER — Emergency Department
Admission: EM | Admit: 2017-09-25 | Discharge: 2017-09-25 | Disposition: A | Discharge status: Home / Self Care | Payer: Medicaid Other | Attending: Emergency Medicine | Admitting: Emergency Medicine

## 2017-09-25 ENCOUNTER — Encounter: Payer: Self-pay | Admitting: Emergency Medicine

## 2017-09-25 ENCOUNTER — Other Ambulatory Visit: Payer: Self-pay

## 2017-09-25 ENCOUNTER — Ambulatory Visit
Admission: EM | Admit: 2017-09-25 | Discharge: 2017-09-25 | Disposition: A | Discharge status: Home / Self Care | Payer: No Typology Code available for payment source | Attending: Emergency Medicine | Admitting: Emergency Medicine

## 2017-09-25 DIAGNOSIS — G44309 Post-traumatic headache, unspecified, not intractable: Secondary | ICD-10-CM | POA: Diagnosis not present

## 2017-09-25 DIAGNOSIS — Z0489 Encounter for examination and observation for other specified reasons: Secondary | ICD-10-CM | POA: Diagnosis not present

## 2017-09-25 DIAGNOSIS — Z79899 Other long term (current) drug therapy: Secondary | ICD-10-CM | POA: Diagnosis not present

## 2017-09-25 DIAGNOSIS — Z87891 Personal history of nicotine dependence: Secondary | ICD-10-CM | POA: Diagnosis not present

## 2017-09-25 DIAGNOSIS — Z0441 Encounter for examination and observation following alleged adult rape: Secondary | ICD-10-CM | POA: Diagnosis not present

## 2017-09-25 DIAGNOSIS — Z7982 Long term (current) use of aspirin: Secondary | ICD-10-CM | POA: Diagnosis not present

## 2017-09-25 LAB — CBC
HCT: 36.8 % (ref 35.0–47.0)
HEMOGLOBIN: 12.6 g/dL (ref 12.0–16.0)
MCH: 36.3 pg — ABNORMAL HIGH (ref 26.0–34.0)
MCHC: 34.2 g/dL (ref 32.0–36.0)
MCV: 106 fL — ABNORMAL HIGH (ref 80.0–100.0)
Platelets: 172 10*3/uL (ref 150–440)
RBC: 3.47 MIL/uL — ABNORMAL LOW (ref 3.80–5.20)
RDW: 15.5 % — ABNORMAL HIGH (ref 11.5–14.5)
WBC: 5.2 10*3/uL (ref 3.6–11.0)

## 2017-09-25 LAB — COMPREHENSIVE METABOLIC PANEL
ALK PHOS: 85 U/L (ref 38–126)
ALT: 20 U/L (ref 0–44)
AST: 26 U/L (ref 15–41)
Albumin: 3.6 g/dL (ref 3.5–5.0)
Anion gap: 7 (ref 5–15)
BUN: 8 mg/dL (ref 6–20)
CALCIUM: 8.4 mg/dL — AB (ref 8.9–10.3)
CO2: 24 mmol/L (ref 22–32)
Chloride: 108 mmol/L (ref 98–111)
Creatinine, Ser: 0.56 mg/dL (ref 0.44–1.00)
GFR calc Af Amer: >60 mL/min (ref >60)
GFR calc non Af Amer: >60 mL/min (ref >60)
Glucose, Bld: 108 mg/dL — ABNORMAL HIGH (ref 70–99)
Potassium: 3.8 mmol/L (ref 3.5–5.1)
SODIUM: 139 mmol/L (ref 135–145)
TOTAL PROTEIN: 7.1 g/dL (ref 6.5–8.1)
Total Bilirubin: 0.5 mg/dL (ref 0.3–1.2)

## 2017-09-25 LAB — URINE DRUG SCREEN, QUALITATIVE (ARMC ONLY)
Amphetamines, Ur Screen: NOT DETECTED
Barbiturates, Ur Screen: NOT DETECTED
Cannabinoid 50 Ng, Ur ~~LOC~~: NOT DETECTED
Cocaine Metabolite,Ur ~~LOC~~: NOT DETECTED
MDMA (Ecstasy)Ur Screen: NOT DETECTED
METHADONE SCREEN, URINE: NOT DETECTED
OPIATE, UR SCREEN: NOT DETECTED
Phencyclidine (PCP) Ur S: NOT DETECTED
Tricyclic, Ur Screen: NOT DETECTED

## 2017-09-25 LAB — RAPID HIV SCREEN (HIV 1/2 AB+AG)
HIV 1/2 ANTIBODIES: NONREACTIVE
HIV-1 P24 Antigen - HIV24: NONREACTIVE

## 2017-09-25 LAB — POCT PREGNANCY, URINE: PREG TEST UR: NEGATIVE
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
## 2017-09-25 MED ORDER — CEFTRIAXONE SODIUM 250 MG IJ SOLR
250.0000 mg | Freq: Once | INTRAMUSCULAR | Status: AC
  Administered 2017-09-25: 250 mg via INTRAMUSCULAR
  Filled 2017-09-25: qty 250

## 2017-09-25 MED ORDER — PROMETHAZINE HCL 25 MG PO TABS
25.0000 mg | ORAL_TABLET | Freq: Four times a day (QID) | ORAL | Status: DC | PRN
  Filled 2017-09-25: qty 1

## 2017-09-25 MED ORDER — ULIPRISTAL ACETATE 30 MG PO TABS
30.0000 mg | ORAL_TABLET | Freq: Once | ORAL | Status: AC
  Administered 2017-09-25: 30 mg via ORAL
  Filled 2017-09-25: qty 1

## 2017-09-25 MED ORDER — ELVITEG-COBIC-EMTRICIT-TENOFAF 150-150-200-10 MG PO TABS: 1.0000 | ORAL_TABLET | Freq: Every day | ORAL | 0 refills | Status: DC

## 2017-09-25 MED ORDER — ELVITEG-COBIC-EMTRICIT-TENOFAF 150-150-200-10 MG PREPACK
5.0000 | ORAL_TABLET | Freq: Once | ORAL | Status: AC
  Administered 2017-09-25: 1 via ORAL

## 2017-09-25 MED ORDER — METRONIDAZOLE 500 MG PO TABS
2000.0000 mg | ORAL_TABLET | Freq: Once | ORAL | Status: AC
  Administered 2017-09-25: 2000 mg via ORAL
  Filled 2017-09-25: qty 4

## 2017-09-25 MED ORDER — AZITHROMYCIN 500 MG PO TABS
1000.0000 mg | ORAL_TABLET | Freq: Once | ORAL | Status: AC
  Administered 2017-09-25: 1000 mg via ORAL
  Filled 2017-09-25: qty 2

## 2017-09-25 MED ORDER — METRONIDAZOLE 500 MG PO TABS
ORAL_TABLET | ORAL | Status: AC
  Filled 2017-09-25: qty 4

## 2017-09-25 MED ORDER — IOPAMIDOL (ISOVUE-370) INJECTION 76%
75.0000 mL | Freq: Once | INTRAVENOUS | Status: AC | PRN
  Administered 2017-09-25: 75 mL via INTRAVENOUS

## 2017-09-25 MED ORDER — LIDOCAINE HCL (PF) 1 % IJ SOLN
0.9000 mL | Freq: Once | INTRAMUSCULAR | Status: AC
  Administered 2017-09-25: 0.9 mL
  Filled 2017-09-25: qty 5
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
## 2017-09-25 NOTE — SANE Note (Signed)
This RN verified with MD that it was okay to administer Rocephin injection despite previous childhood allergy to Penicillin.
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
## 2017-09-25 NOTE — ED Notes (Signed)
Patient transported to CT at this time. 
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
## 2017-09-25 NOTE — ED Notes (Signed)
Pt given sandwich tray and soda to drink at this time per verbal okay by Dr Lenard LancePaduchowski.

## 2017-09-25 NOTE — Discharge Instructions (Signed)
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 care provider a list of all the medicines, herbs, non-prescription drugs, or dietary supplements you use. Also tell them if you smoke, drink alcohol, or use illegal drugs. Some items may interact with your medicine. What should I watch for while using this medicine? Tell your doctor or health care professional if your symptoms do not improve or if they get  worse. Do not treat diarrhea with over the counter products. Contact your doctor if you have diarrhea that lasts more than 2 days or if it is severe and watery. If you are being treated for a sexually transmitted disease, avoid sexual contact until you have finished your treatment. Having sex can infect your sexual partner. Calcium may bind to this medicine and cause lung or kidney problems. Avoid calcium products while taking this medicine and for  48 hours after taking the last dose of this medicine. What side effects may I notice from receiving this medicine? Side effects that you should report to your doctor or health care professional as soon as possible: -allergic reactions like skin rash, itching or hives, swelling of the face, lips, or tongue -breathing problems -fever, chills -irregular heartbeat -pain when passing urine -seizures -stomach pain, cramps -unusual bleeding, bruising -unusually weak or tired Side effects that usually do not require medical attention (report to your doctor or health care professional if they continue or are bothersome): -diarrhea -dizzy, drowsy -headache -nausea, vomiting -pain, swelling, irritation where injected -stomach upset -sweating This list may not describe all possible side effects. Call your doctor for medical advice about side effects. You may report side effects to FDA at 1-800-FDA-1088. Where should I keep my medicine? Keep out of the reach of children. Store at room temperature below 25 degrees C (77 degrees F). Protect from light. Throw away any unused vials after the expiration date. NOTE: This sheet is a summary. It may not cover all possible information. If you have questions about this medicine, talk to your doctor, pharmacist, or health care provider.  2017 Elsevier/Gold Standard (2013-08-25 09:14:54)    Cobicistat; Elvitegravir; Emtricitabine; Tenofovir Alafenamide oral tablets   What is this medicine? COBICISTAT; ELVITEGRAVIR; EMTRICITABINE; TENOFOVIR ALAFENAMIDE (koe BIS i stat; el vye TEG ra veer; em tri SIT uh bean; te NOE fo veer) is three antiretroviral medicines and a medication booster in one tablet. It is used to treat HIV. This medicine is not a cure for HIV. It will not stop the spread of HIV to others. This medicine may be used for other purposes; ask your health care provider or pharmacist if you have questions. COMMON BRAND NAME(S): Genvoya  What  should I tell my health care provider before I take this medicine? They need to know if you have any of these conditions: -kidney disease -liver disease -an unusual or allergic reaction to cobicistat, elvitegravir, emtricitabine, tenofovir, other medicines, foods, dyes, or preservatives -pregnant or trying to get pregnant -breast-feeding  How should I use this medicine? Take this medicine by mouth with a glass of water. Follow the directions on the prescription label. Take this medicine with food. Take your medicine at regular intervals. Do not take your medicine more often than directed. For your anti-HIV therapy to work as well as possible, take each dose exactly as prescribed. Do not skip doses or stop your medicine even if you feel better. Skipping doses may make the HIV virus resistant to this medicine and other medicines. Do not stop taking except on your doctor's advice. Talk to your pediatrician regarding the use of this medicine in children. While this drug may be prescribed for selected conditions, precautions do apply. Overdosage: If you think you have taken too much of this medicine contact a poison control center or emergency room at once. NOTE: This medicine is only for you. Do not share this medicine with others.  What if I miss a dose? If  you miss a dose, take it as soon as you can. If it is almost time for your next dose, take only that dose. Do not take double or extra doses.  What may interact with this medicine? Do not take this medicine with any of the following medications: -adefovir -alfuzosin -certain medicines for seizures like carbamazepine, phenobarbital, phenytoin -cisapride -lumacaftor; ivacaftor -lurasidone -medicines for cholesterol like lovastatin, simvastatin -medicines for headaches like dihydroergotamine, ergotamine, methylergonovine -midazolam -other antiviral medicines for HIV or AIDS -pimozide -rifampin -sildenafil -[facility] John's  wort -triazolam This medicine may also interact with the following medications: -antacids -atorvastatin -bosentan -buprenorphine; naloxone -certain antibiotics like clarithromycin, telithromycin, rifabutin, rifapentine -certain medications for anxiety or sleep like buspirone, clorazepate, diazepam, estazolam, flurazepam, zolpidem -certain medicines for blood pressure or heart disease like amlodipine, diltiazem, felodipine, metoprolol, nicardipine, nifedipine, timolol, verapamil -certain medicines for depression, anxiety, or psychiatric disturbances -certain medicines for erectile dysfunction like avanafil, sildenafil, tadalafil, vardenafil -certain medicines for fungal infection like itraconazole, ketoconazole, voriconazole -colchicine -cyclosporine -dexamethasone -female hormones, like estrogens and progestins and birth control pills -fluticasone -medicines for infection like acyclovir, cidofovir, valacyclovir, ganciclovir, valganciclovir -medicines for irregular heart beat like amiodarone, bepridil, digoxin, disopyramide, dofetilide, flecainide, lidocaine, mexiletine, propafenone, quinidine -metformin -oxcarbazepine -phenothiazines like perphenazine, risperidone, thioridazine -salmeterol -sirolimus -tacrolimus -warfarin This list may not describe all possible interactions. Give your health care provider a list of all the medicines, herbs, non-prescription drugs, or dietary supplements you use. Also tell them if you smoke, drink alcohol, or use illegal drugs. Some items may interact with your medicine.  What should I watch for while using this medicine? Visit your doctor or health care professional for regular check ups. Discuss any new symptoms with your doctor. You will need to have important blood work done while on this medicine. HIV is spread to others through sexual or blood contact. Talk to your doctor about how to stop the spread of HIV. If you have hepatitis B, talk to your  doctor if you plan to stop this medicine. The symptoms of hepatitis B may get worse if you stop this medicine. Birth control pills may not work properly while you are taking this medicine. Talk to your doctor about using an extra method of birth control. Women who can still have children must use a reliable form of barrier contraception, like a condom.  What side effects may I notice from receiving this medicine? Side effects that you should report to your doctor or health care professional as soon as possible: -allergic reactions like skin rash, itching or hives, swelling of the face, lips, or tongue -breathing problems -fast, irregular heartbeat -muscle pain or weakness -signs and symptoms of kidney injury like trouble passing urine or change in the amount of urine -signs and symptoms of liver injury like dark yellow or brown urine; general ill feeling or flu-like symptoms; light-colored stools; loss of appetite; right upper belly pain; unusually weak or tired; yellowing of the eyes or skin Side effects that usually do not require medical attention (report to your doctor or health care professional if they continue or are bothersome): -diarrhea -headache -nausea -tiredness This list may not describe all possible side effects. Call your doctor for medical advice about side effects. You may report side effects to FDA at 1-800-FDA-1088.  Where should I keep my medicine? Keep out of the reach of children. Store at room temperature below 30 degrees C (86 degrees F). Throw away any unused medicine after the expiration date. NOTE: This sheet is a  summary. It may not cover all possible information. If you have questions about this medicine, talk to your doctor, pharmacist, or health care provider.  2018 Elsevier/Gold Standard (2015-11-22 12:54:04)

## 2017-09-25 NOTE — ED Notes (Signed)
SANE nurse on the way. 

## 2017-09-25 NOTE — ED Notes (Signed)
Pt attempting to call family at this time for transportation needs at d/c.

## 2017-09-25 NOTE — ED Notes (Signed)
SANE  NURSE  CALLED  PER  DR  Scotty CourtSTAFFORD MD

## 2017-09-26 MED FILL — GENVOYA TABLET: 150-150-200 | 30 days supply | Qty: 30 | Fill #0

## 2017-09-26 NOTE — SANE Note (Signed)
ON 09/26/2017, AT APPROXIMATELY 1049 HOURS, I ATTEMPTED TO MAKE CONTACT W/ THE PT VIA HER CELL PHONE [phone]), AT THE REQUEST OF Jacki ConesLAURIE, RN, FNE.  Jacki ConesLAURIE, RN, FNE, ASKED THAT THE PT BE REMINDED TO FOLLOW-UP FOR A GYNECOLOGICAL EXAMINATION TO INVESTIGATE HER VAGINAL BLEEDING.  THE PT DID NOT ANSWER THE PHONE, AND I DID NOT LEAVE A MESSAGE FOR HER.

## 2017-09-26 NOTE — SANE Note (Signed)
SANE consult and potential evidence collection has been completed by Jacki ConesLaurie, RN, FNE, on 09/26/2017. Primary or charge RN and physician were notified on 09/25/2017. Please contact the SANE nurse on call (listed in Amion) with any further concerns.

## 2017-09-26 NOTE — SANE Note (Signed)
ON 09/26/2017, AT APPROXIMATELY 1430 HOURS, THE [facility] WAS PROVIDED WITH THE FOLLOWING ADVANCING ACCESS PROGRAM (APP) VOUCHER NUMBERS FOR [facility], USING THE iASSIST PORTAL, VIA EMAIL:   BIN #:  [id number]  PCN #:  [id number]  GROUP #:  [id number]  MEMBER #:  [id number]

## 2017-09-26 NOTE — SANE Note (Signed)
Follow-up Phone Call  Patient gives verbal consent for a FNE/SANE follow-up phone call in 48-72 hours: yes Patient's telephone number: [phone] (PT'S CELL) Patient gives verbal consent to leave voicemail at the phone number listed above: UNSURE; LAURIE TOOK CARE OF THIS PT. DO NOT CALL between the hours of: Berna BueUNKNOWN    POLICE DEPARTMENT CASE NUMBER:  [id number]  DETECTIVE K. SISK # [id number]

## 2017-10-01 NOTE — SANE Note (Signed)
At approximately 1912, follow up phone call made to patient by this RN.  Patient states she does not remember much of the incident that brought her to the hospital or the actual forensic examination.  Patient states she has no questions at this time.

## 2017-11-14 DIAGNOSIS — F1010 Alcohol abuse, uncomplicated: Secondary | ICD-10-CM

## 2017-11-14 HISTORY — DX: Alcohol abuse, uncomplicated: F10.10

## 2017-12-10 IMAGING — US US ABDOMEN LIMITED
1 series · 14 of 25 positions shown · non-contrast
Comparison: [DATE]

CLINICAL DATA: History of cirrhosis.

EXAM:
ULTRASOUND ABDOMEN LIMITED RIGHT UPPER QUADRANT

[Series 1: us abdomen limited · 0.30mm/px · 14 of 40 slices shown]
[im 1/40]
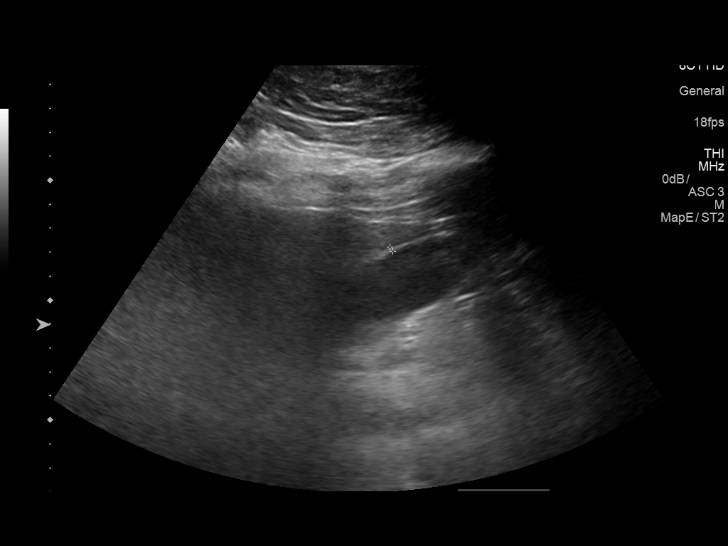
[im 4/40]
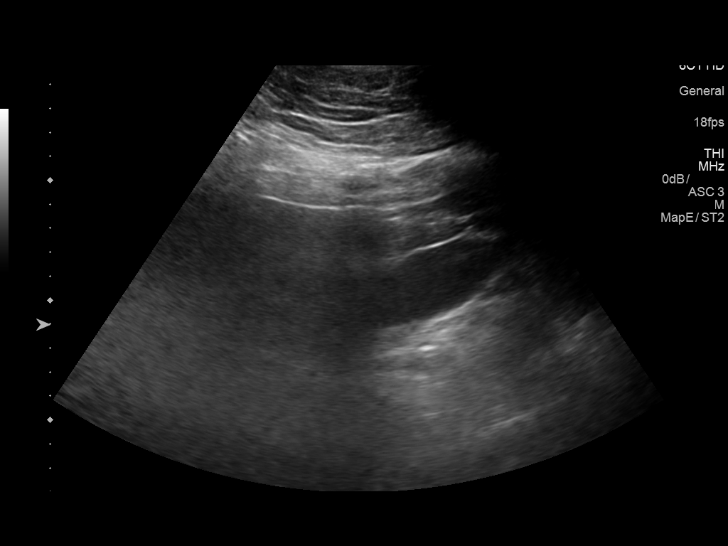
[im 7/40]
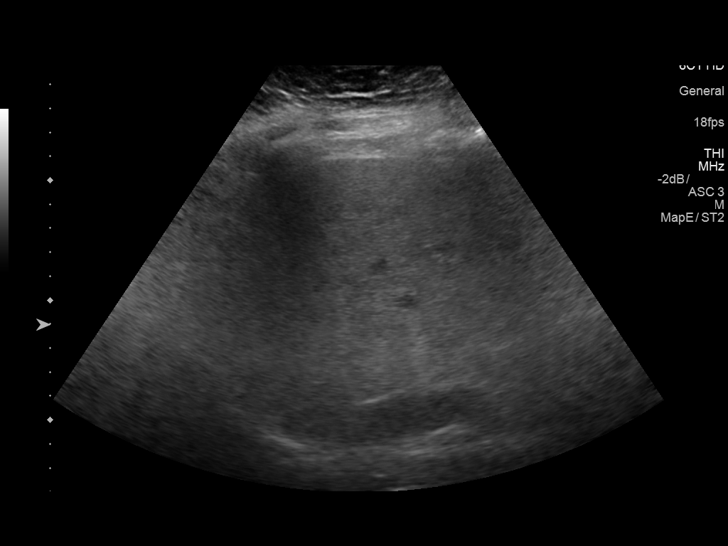
[im 10/40]
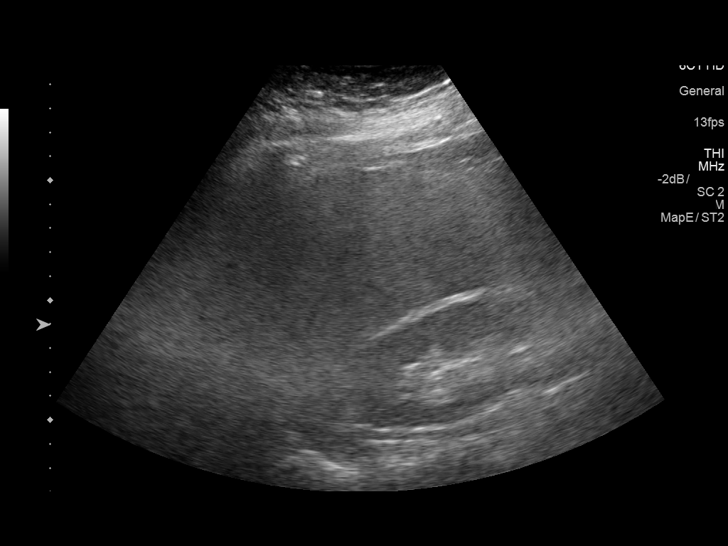
[im 14/40]
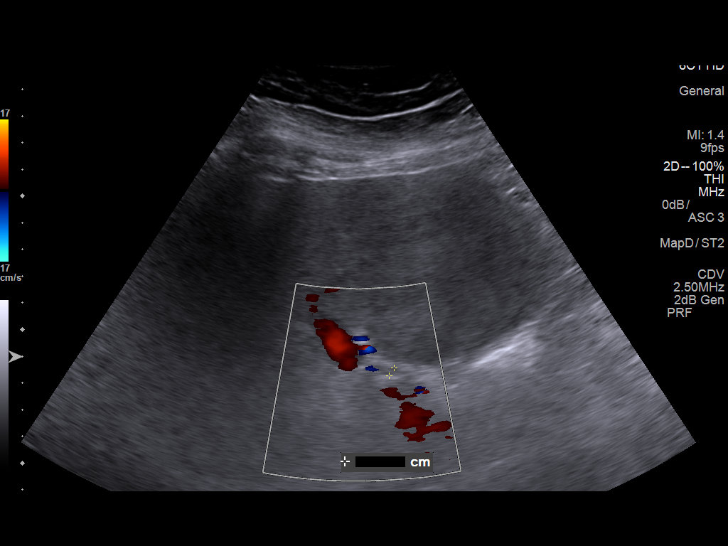
[im 15/40]
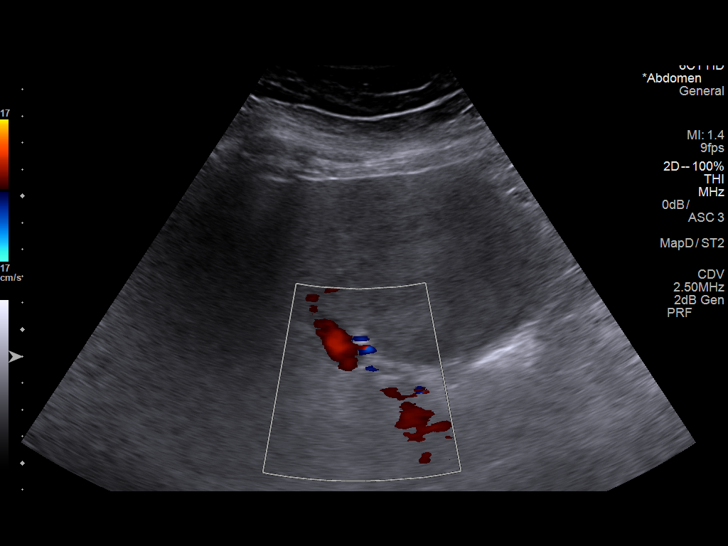
[im 18/40]
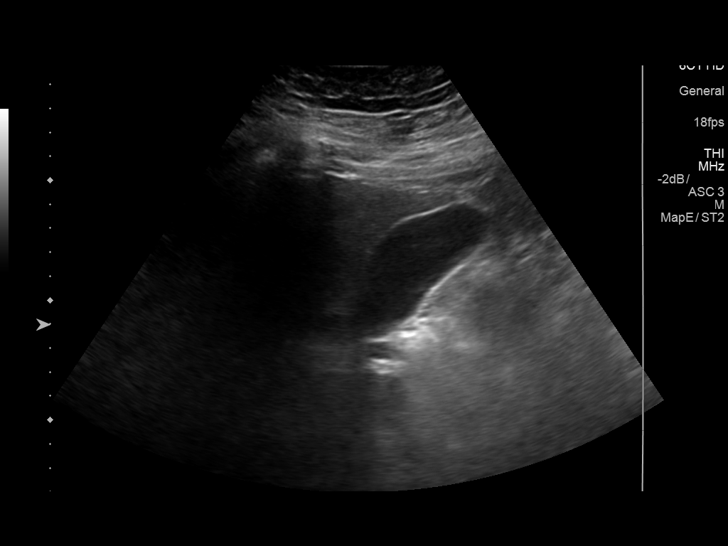
[im 22/40]
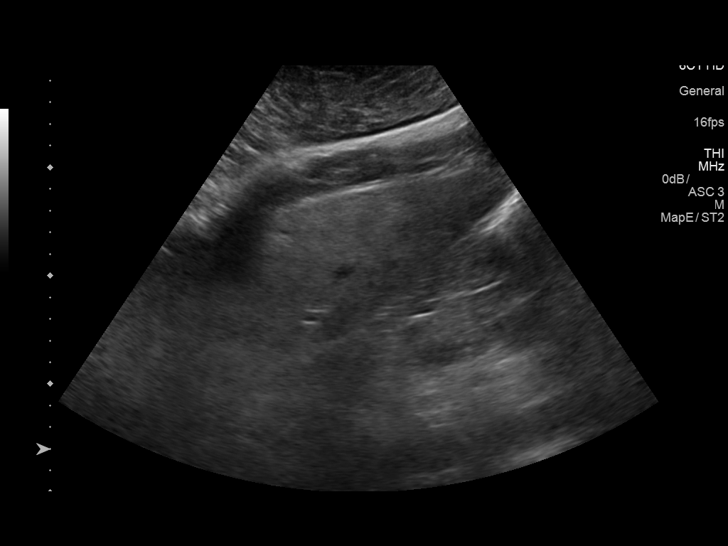
[im 25/40]
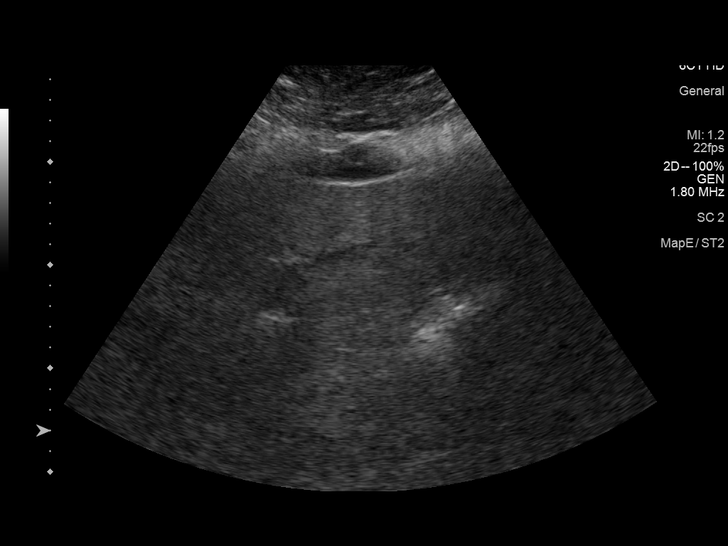
[im 27/40]
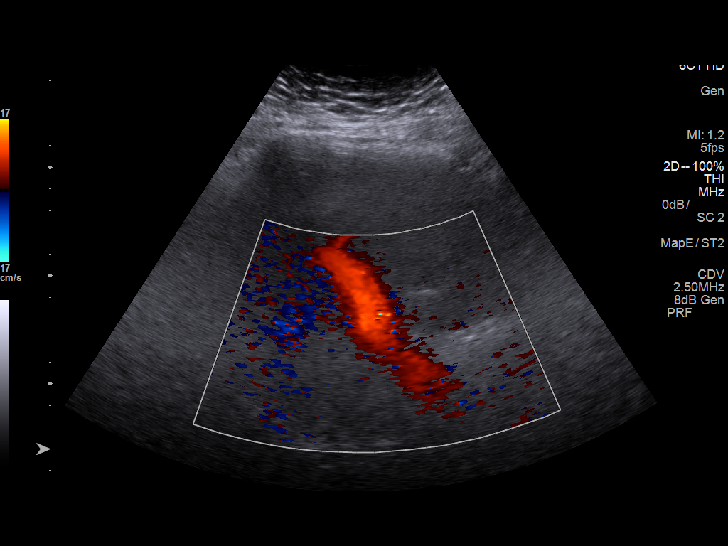
[im 30/40]
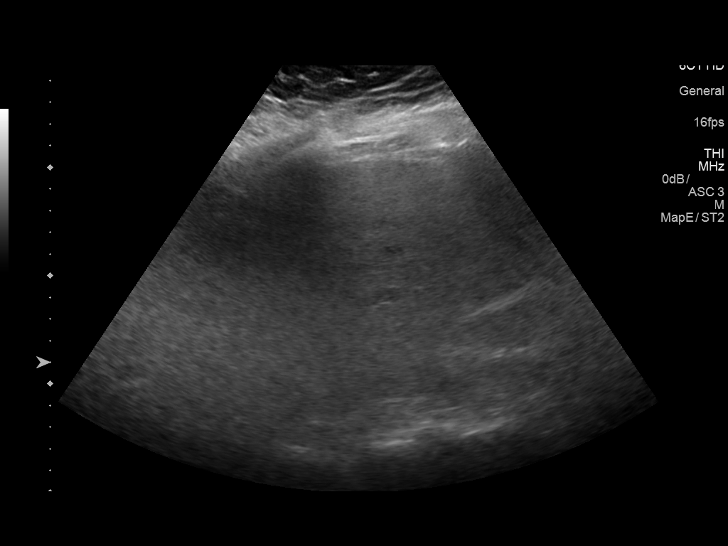
[im 33/40]
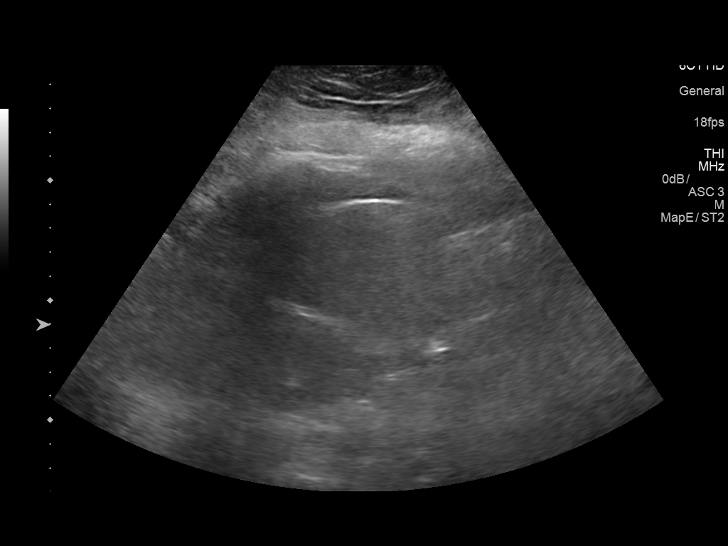
[im 36/40]
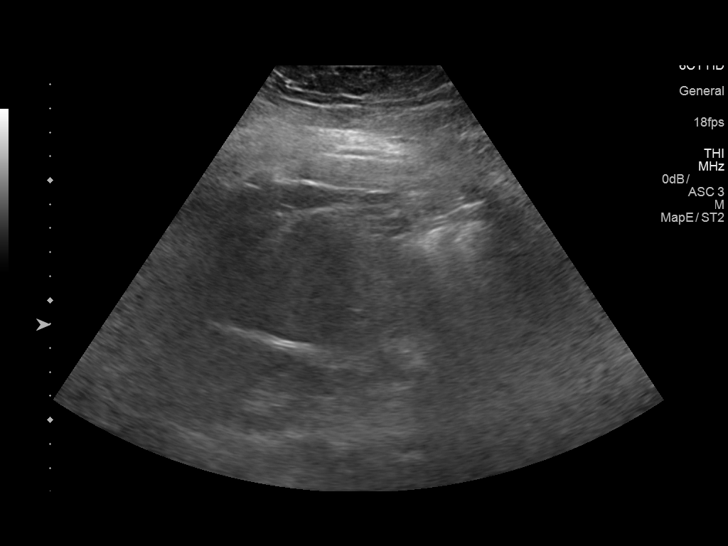
[im 40/40]
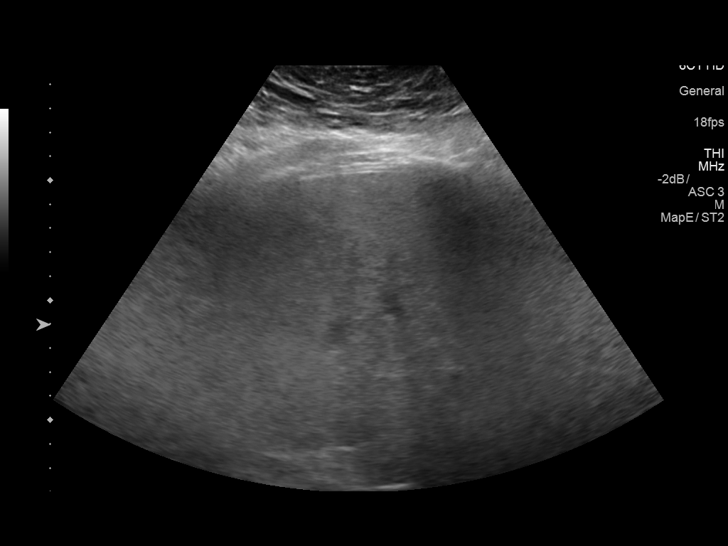

[14 of 25 positions shown; findings below may reference images not displayed]

FINDINGS: Gallbladder:

No gallstones or wall thickening visualized. No sonographic Murphy
sign noted by sonographer.

Common bile duct:

Diameter: 4 mm.

Liver:

The liver demonstrates stable coarse echotexture and increased
echogenicity. No focal lesions are identified. There is no evidence
of intrahepatic biliary ductal dilatation. The portal vein is patent
on color Doppler imaging with normal direction of blood flow towards
the liver.
IMPRESSION: Stable sonographic appearance of the liver. No focal masses
identified by ultrasound. No evidence of biliary ductal dilatation.

## 2017-12-26 ENCOUNTER — Other Ambulatory Visit: Payer: Self-pay | Admitting: Nurse Practitioner

## 2017-12-26 DIAGNOSIS — K7469 Other cirrhosis of liver: Secondary | ICD-10-CM

## 2018-01-01 ENCOUNTER — Ambulatory Visit
Admission: RE | Admit: 2018-01-01 | Discharge: 2018-01-01 | Disposition: A | Discharge status: Home / Self Care | Payer: Medicaid Other | Source: Ambulatory Visit | Attending: Nurse Practitioner | Admitting: Nurse Practitioner

## 2018-01-01 DIAGNOSIS — K7469 Other cirrhosis of liver: Secondary | ICD-10-CM

## 2018-07-07 DIAGNOSIS — M5430 Sciatica, unspecified side: Secondary | ICD-10-CM

## 2018-07-07 HISTORY — DX: Sciatica, unspecified side: M54.30

## 2018-07-14 ENCOUNTER — Other Ambulatory Visit: Payer: Self-pay

## 2018-07-14 ENCOUNTER — Encounter (HOSPITAL_COMMUNITY): Payer: Self-pay

## 2018-07-14 ENCOUNTER — Inpatient Hospital Stay (HOSPITAL_COMMUNITY)
Admission: EM | Admit: 2018-07-14 | Discharge: 2018-07-16 | DRG: 519 | Disposition: A | Discharge status: Home / Self Care | Payer: Medicaid Other | Attending: Neurological Surgery | Admitting: Neurological Surgery

## 2018-07-14 DIAGNOSIS — E785 Hyperlipidemia, unspecified: Secondary | ICD-10-CM | POA: Diagnosis present

## 2018-07-14 DIAGNOSIS — Z7982 Long term (current) use of aspirin: Secondary | ICD-10-CM | POA: Diagnosis not present

## 2018-07-14 DIAGNOSIS — M5117 Intervertebral disc disorders with radiculopathy, lumbosacral region: Principal | ICD-10-CM | POA: Diagnosis present

## 2018-07-14 DIAGNOSIS — M4807 Spinal stenosis, lumbosacral region: Secondary | ICD-10-CM | POA: Diagnosis present

## 2018-07-14 DIAGNOSIS — K7460 Unspecified cirrhosis of liver: Secondary | ICD-10-CM | POA: Diagnosis present

## 2018-07-14 DIAGNOSIS — F419 Anxiety disorder, unspecified: Secondary | ICD-10-CM | POA: Diagnosis present

## 2018-07-14 DIAGNOSIS — Z6841 Body Mass Index (BMI) 40.0 and over, adult: Secondary | ICD-10-CM

## 2018-07-14 DIAGNOSIS — I10 Essential (primary) hypertension: Secondary | ICD-10-CM | POA: Diagnosis present

## 2018-07-14 DIAGNOSIS — Z87891 Personal history of nicotine dependence: Secondary | ICD-10-CM | POA: Diagnosis not present

## 2018-07-14 DIAGNOSIS — Z79891 Long term (current) use of opiate analgesic: Secondary | ICD-10-CM | POA: Diagnosis not present

## 2018-07-14 DIAGNOSIS — Z800 Family history of malignant neoplasm of digestive organs: Secondary | ICD-10-CM

## 2018-07-14 DIAGNOSIS — K219 Gastro-esophageal reflux disease without esophagitis: Secondary | ICD-10-CM | POA: Diagnosis present

## 2018-07-14 DIAGNOSIS — Z419 Encounter for procedure for purposes other than remedying health state, unspecified: Secondary | ICD-10-CM

## 2018-07-14 DIAGNOSIS — E882 Lipomatosis, not elsewhere classified: Secondary | ICD-10-CM | POA: Diagnosis present

## 2018-07-14 DIAGNOSIS — E282 Polycystic ovarian syndrome: Secondary | ICD-10-CM | POA: Diagnosis present

## 2018-07-14 DIAGNOSIS — Z79899 Other long term (current) drug therapy: Secondary | ICD-10-CM

## 2018-07-14 DIAGNOSIS — M5126 Other intervertebral disc displacement, lumbar region: Secondary | ICD-10-CM | POA: Diagnosis present

## 2018-07-14 DIAGNOSIS — Z8619 Personal history of other infectious and parasitic diseases: Secondary | ICD-10-CM | POA: Diagnosis not present

## 2018-07-14 DIAGNOSIS — E7800 Pure hypercholesterolemia, unspecified: Secondary | ICD-10-CM | POA: Diagnosis present

## 2018-07-14 DIAGNOSIS — Z8249 Family history of ischemic heart disease and other diseases of the circulatory system: Secondary | ICD-10-CM

## 2018-07-14 DIAGNOSIS — Z1159 Encounter for screening for other viral diseases: Secondary | ICD-10-CM | POA: Diagnosis not present

## 2018-07-14 DIAGNOSIS — M5442 Lumbago with sciatica, left side: Secondary | ICD-10-CM

## 2018-07-14 HISTORY — DX: Other intervertebral disc displacement, lumbar region: M51.26

## 2018-07-14 LAB — CBC WITH DIFFERENTIAL/PLATELET
Abs Immature Granulocytes: 0.03 10*3/uL (ref 0.00–0.07)
Basophils Absolute: 0 10*3/uL (ref 0.0–0.1)
Basophils Relative: 1 %
Eosinophils Absolute: 0.4 10*3/uL (ref 0.0–0.5)
Eosinophils Relative: 6 %
HCT: 41.3 % (ref 36.0–46.0)
Hemoglobin: 13.4 g/dL (ref 12.0–15.0)
Immature Granulocytes: 0 %
Lymphocytes Relative: 21 %
Lymphs Abs: 1.5 10*3/uL (ref 0.7–4.0)
MCH: 33.8 pg (ref 26.0–34.0)
MCHC: 32.4 g/dL (ref 30.0–36.0)
MCV: 104 fL — ABNORMAL HIGH (ref 80.0–100.0)
Monocytes Absolute: 0.8 10*3/uL (ref 0.1–1.0)
Monocytes Relative: 11 %
Neutro Abs: 4.5 10*3/uL (ref 1.7–7.7)
Neutrophils Relative %: 61 %
Platelets: 166 10*3/uL (ref 150–400)
RBC: 3.97 MIL/uL (ref 3.87–5.11)
RDW: 14.5 % (ref 11.5–15.5)
WBC: 7.2 10*3/uL (ref 4.0–10.5)
nRBC: 0 % (ref 0.0–0.2)

## 2018-07-14 LAB — SARS CORONAVIRUS 2 BY RT PCR (HOSPITAL ORDER, PERFORMED IN ~~LOC~~ HOSPITAL LAB): SARS Coronavirus 2: NEGATIVE

## 2018-07-14 LAB — BASIC METABOLIC PANEL
Anion gap: 9 (ref 5–15)
BUN: 9 mg/dL (ref 6–20)
CO2: 22 mmol/L (ref 22–32)
Calcium: 9.1 mg/dL (ref 8.9–10.3)
Chloride: 106 mmol/L (ref 98–111)
Creatinine, Ser: 0.66 mg/dL (ref 0.44–1.00)
GFR calc Af Amer: >60 mL/min (ref >60)
GFR calc non Af Amer: >60 mL/min (ref >60)
Glucose, Bld: 104 mg/dL — ABNORMAL HIGH (ref 70–99)
Potassium: 4.5 mmol/L (ref 3.5–5.1)
Sodium: 137 mmol/L (ref 135–145)

## 2018-07-14 LAB — PROTIME-INR
INR: 1 (ref 0.8–1.2)
Prothrombin Time: 13.4 seconds (ref 11.4–15.2)

## 2018-07-14 LAB — APTT: aPTT: 24 seconds (ref 24–36)

## 2018-07-14 LAB — I-STAT BETA HCG BLOOD, ED (MC, WL, AP ONLY): I-stat hCG, quantitative: <5 m[IU]/mL (ref <5)

## 2018-07-14 LAB — SURGICAL PCR SCREEN
MRSA, PCR: NEGATIVE
Staphylococcus aureus: NEGATIVE

## 2018-07-14 MED ORDER — CELECOXIB 200 MG PO CAPS
200.0000 mg | ORAL_CAPSULE | Freq: Two times a day (BID) | ORAL | Status: DC
  Administered 2018-07-15 – 2018-07-16 (×2): 200 mg via ORAL
  Filled 2018-07-14 (×2): qty 1

## 2018-07-14 MED ORDER — MENTHOL 3 MG MT LOZG: 1.0000 | LOZENGE | OROMUCOSAL | Status: DC | PRN

## 2018-07-14 MED ORDER — PANTOPRAZOLE SODIUM 40 MG PO TBEC
40.0000 mg | DELAYED_RELEASE_TABLET | Freq: Every day | ORAL | Status: DC
  Administered 2018-07-16: 09:00:00 40 mg via ORAL
  Filled 2018-07-14: qty 1

## 2018-07-14 MED ORDER — KETOROLAC TROMETHAMINE 30 MG/ML IJ SOLN
30.0000 mg | Freq: Once | INTRAMUSCULAR | Status: DC
  Filled 2018-07-14: qty 1

## 2018-07-14 MED ORDER — METHOCARBAMOL 500 MG PO TABS
500.0000 mg | ORAL_TABLET | Freq: Two times a day (BID) | ORAL | Status: DC
  Administered 2018-07-15 – 2018-07-16 (×2): 500 mg via ORAL
  Filled 2018-07-14 (×3): qty 1

## 2018-07-14 MED ORDER — LISINOPRIL 20 MG PO TABS
20.0000 mg | ORAL_TABLET | Freq: Every day | ORAL | Status: DC
  Administered 2018-07-15 – 2018-07-16 (×2): 20 mg via ORAL
  Filled 2018-07-14 (×2): qty 1

## 2018-07-14 MED ORDER — ONDANSETRON HCL 4 MG/2ML IJ SOLN: 4.0000 mg | Freq: Four times a day (QID) | INTRAMUSCULAR | Status: DC | PRN

## 2018-07-14 MED ORDER — HYDROMORPHONE HCL 1 MG/ML IJ SOLN
0.5000 mg | INTRAMUSCULAR | Status: DC | PRN
  Administered 2018-07-14 – 2018-07-15 (×4): 0.5 mg via INTRAVENOUS
  Filled 2018-07-14 (×4): qty 1

## 2018-07-14 MED ORDER — FLUTICASONE PROPIONATE 50 MCG/ACT NA SUSP: 2.0000 | Freq: Every day | NASAL | Status: DC | PRN

## 2018-07-14 MED ORDER — TIMOLOL HEMIHYDRATE 0.5 % OP SOLN: 1.0000 [drp] | Freq: Two times a day (BID) | OPHTHALMIC | Status: DC

## 2018-07-14 MED ORDER — ATORVASTATIN CALCIUM 40 MG PO TABS
40.0000 mg | ORAL_TABLET | Freq: Every day | ORAL | Status: DC
  Administered 2018-07-16: 09:00:00 40 mg via ORAL
  Filled 2018-07-14: qty 1

## 2018-07-14 MED ORDER — PHENOL 1.4 % MT LIQD: 1.0000 | OROMUCOSAL | Status: DC | PRN

## 2018-07-14 MED ORDER — DOCUSATE SODIUM 100 MG PO CAPS
100.0000 mg | ORAL_CAPSULE | Freq: Two times a day (BID) | ORAL | Status: DC
  Administered 2018-07-14 – 2018-07-16 (×3): 100 mg via ORAL
  Filled 2018-07-14 (×3): qty 1

## 2018-07-14 MED ORDER — GABAPENTIN 300 MG PO CAPS
600.0000 mg | ORAL_CAPSULE | Freq: Three times a day (TID) | ORAL | Status: DC
  Administered 2018-07-14 – 2018-07-16 (×3): 600 mg via ORAL
  Filled 2018-07-14 (×3): qty 2

## 2018-07-14 MED ORDER — SODIUM CHLORIDE 0.9 % IV SOLN
INTRAVENOUS | Status: DC
  Administered 2018-07-14 – 2018-07-15 (×3): via INTRAVENOUS

## 2018-07-14 MED ORDER — MORPHINE SULFATE (PF) 4 MG/ML IV SOLN
4.0000 mg | Freq: Once | INTRAVENOUS | Status: AC
  Administered 2018-07-14: 18:00:00 4 mg via INTRAVENOUS
  Filled 2018-07-14: qty 1

## 2018-07-14 MED ORDER — OXYCODONE HCL 5 MG PO TABS
10.0000 mg | ORAL_TABLET | Freq: Four times a day (QID) | ORAL | Status: DC
  Administered 2018-07-14: 21:00:00 10 mg via ORAL
  Filled 2018-07-14: qty 2

## 2018-07-14 MED ORDER — ONDANSETRON HCL 4 MG PO TABS: 4.0000 mg | ORAL_TABLET | Freq: Four times a day (QID) | ORAL | Status: DC | PRN

## 2018-07-14 MED ORDER — HYDROXYZINE HCL 25 MG PO TABS
50.0000 mg | ORAL_TABLET | Freq: Three times a day (TID) | ORAL | Status: DC | PRN
  Administered 2018-07-14: 21:00:00 50 mg via ORAL
  Filled 2018-07-14: qty 2

## 2018-07-14 MED ORDER — TIMOLOL MALEATE 0.5 % OP SOLN
1.0000 [drp] | Freq: Two times a day (BID) | OPHTHALMIC | Status: DC
  Administered 2018-07-14 – 2018-07-16 (×3): 1 [drp] via OPHTHALMIC
  Filled 2018-07-14: qty 5

## 2018-07-14 NOTE — H&P (Signed)
Neurosurgery H&P  CC: LLE pain  HPI: This is a 38 y.o. woman with a 4 month history of LLE pain, numbness, and parasthesias. The pain courses along the posterior left leg and terminates in the lateral toes and foot in an S1 distribution. She has tried PT, steroids, injections, OTC and prescription pain medications without pain control. She now comes to the ED because she cannot stand due to severe pain. She denies weakness, endorses numbness and parasthesias. The pain is sharp, severe, located as above, worse with any weight bearing, and prevents her from standing or walking. She has progressed from unassisted ambulation to using a crutch to bilateral crutches to a walker and is now non-ambulatory. No recent change in bowel or bladder function. 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   ROS: A 14 point ROS was performed and is negative except as noted in the HPI.   PMHx:  Past Medical History:  Diagnosis Date  . Cirrhosis (HCC)   . Dyslipidemia   . GERD (gastroesophageal reflux disease)   . Hepatitis C    s/p treatment  . Hypercholesteremia   . Obesity   . PCOS (polycystic ovarian syndrome)   . PTSD (post-traumatic stress disorder)   . Steatosis    FamHx:  Family History  Problem Relation Age of Onset  . High blood pressure Mother   . Heart attack Father   . High blood pressure Father   . Heart attack Paternal Grandfather        10750s  . Colon cancer Maternal Grandfather    SocHx:  reports that she quit smoking about 3 years ago. Her smoking use included cigarettes. She has a 7.50 pack-year smoking history. She has never used smokeless tobacco. She reports current alcohol use. She reports that she does not use drugs.  Exam: Vital signs in last 24 hours: Temp:  [98.2 F (36.8 C)] 98.2 F (36.8 C) (05/24 1409) Pulse Rate:  [73-74] 73 (05/24 1730) Resp:  [19] 19 (05/24 1409) BP: (128-129)/(67-90) 129/90 (05/24 1730) SpO2:  [97 %-99  %] 99 % (05/24 1730) Weight:  [133.8 kg] 133.8 kg (05/24 1418) General: Awake, alert, cooperative, lying in bed in NAD Head: normocephalic and atruamatic HEENT: neck supple Pulmonary: breathing room air comfortably, no evidence of increased work of breathing Cardiac: RRR Abdomen: obese S NT ND Extremities: warm and well perfused x4 Neuro: AOx3, PERRL, EOMI, FS Strength 5/5 x4, SILT except left S1 distribution numbness +SLR test on left with severe pain  Assessment and Plan: 38 y.o. woman with 8mo h/o progressive S1 distribution pain without relief from non-surgical measures, now non-ambulatory due to pain. 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   -given that the patient has passed the 6-8 week period where disc dessication can lead to improvements and subsequently failed non-surgical measures and is now non-ambulatory, I recommend surgical treatment. This would consist of a minimally invasive lumbar microdiscectomy. I explained risks, benefits, and alternatives. We discussed her potentially increased risk for hematoma formation given her ASA81 and hepatic dysfunction as well as the inability to guarantee that her symptoms will improve. She would like to proceed, will schedule for tomorrow.   -admit to regular nursing floor, NPO p MN, preop labs pending  Megan Pierinihomas A Latiesha Harada, MD 07/14/18 5:42 PM [facility]

## 2018-07-14 NOTE — ED Notes (Signed)
ED TO INPATIENT HANDOFF REPORT  ED Nurse Name and Phone #:  [phone]  S Name/Age/Gender Megan [name] 38 y.o. female Room/Bed: 031C/031C  Code Status   Code Status: Prior  Home/SNF/Other Home Patient oriented to: self, place, time and situation Is this baseline? Yes   Triage Complete: Triage complete  Chief Complaint back pain  Triage Note Pt arrives POV for eval of lower L sided back pain and L leg pain. Pt reports she recently had MRI at [facility] on Friday and was dx'd w/ "severely herniated, crushed disc". Pt reports she is supposed to follow up with neurosurg on Tuesday, but cannot make it to that time because of the pain. She states she is no longer ambulatory, denies loss of bowel/bladder, denies saddle paresthesias. Pt requested to be brought in on a stretcher from car, placed in recliner in position of comfort. Pt also endorses L leg numbness/tingling which has been ongoing for 2-3 weeks.    Allergies Penicillins Varencline  Level of Care/Admitting Diagnosis ED Disposition    None      B Medical/Surgery History Past Medical History:  Diagnosis Date  . Cirrhosis (HCC)   . Dyslipidemia   . GERD (gastroesophageal reflux disease)   . Hepatitis C    s/p treatment  . Hypercholesteremia   . Obesity   . PCOS (polycystic ovarian syndrome)   . PTSD (post-traumatic stress disorder)   . Steatosis    Past Surgical History:  Procedure Laterality Date  . HAND SURGERY Left   . KNEE ARTHROSCOPY Right   . LIVER BIOPSY    . TONSILLECTOMY       A IV Location/Drains/Wounds Patient Lines/Drains/Airways Status   Active Line/Drains/Airways    Name:   Placement date:   Placement time:   Site:   Days:   Peripheral IV 07/14/18 Left Antecubital   07/14/18    1656    Antecubital   less than 1   Incision (Closed) 06/07/15 Abdomen Right;Medial   06/07/15    1345     1133          Intake/Output Last 24 hours No intake or output data in the 24 hours ending 07/14/18  1751  Labs/Imaging Results for orders placed or performed during the hospital encounter of 07/14/18 (from the past 48 hour(s))  Basic metabolic panel     Status: Abnormal   Collection Time: 07/14/18  4:41 PM  Result Value Ref Range   Sodium 137 135 - 145 mmol/L   Potassium 4.5 3.5 - 5.1 mmol/L   Chloride 106 98 - 111 mmol/L   CO2 22 22 - 32 mmol/L   Glucose, Bld 104 (H) 70 - 99 mg/dL   BUN 9 6 - 20 mg/dL   Creatinine, Ser 7.41 0.44 - 1.00 mg/dL   Calcium 9.1 8.9 - 28.7 mg/dL   GFR calc non Af Amer >60 >60 mL/min   GFR calc Af Amer >60 >60 mL/min   Anion gap 9 5 - 15    Comment: Performed at [facility], [address] [place], [place] [address]  CBC with Differential     Status: Abnormal   Collection Time: 07/14/18  4:41 PM  Result Value Ref Range   WBC 7.2 4.0 - 10.5 K/uL   RBC 3.97 3.87 - 5.11 MIL/uL   Hemoglobin 13.4 12.0 - 15.0 g/dL   HCT 20.9 47.0 - 96.2 %   MCV 104.0 (H) 80.0 - 100.0 fL   MCH 33.8 26.0 -  34.0 pg   MCHC 32.4 30.0 - 36.0 g/dL   RDW 84.614.5 96.211.5 - 95.215.5 %   Platelets 166 150 - 400 K/uL    Comment: REPEATED TO VERIFY   nRBC 0.0 0.0 - 0.2 %   Neutrophils Relative % 61 %   Neutro Abs 4.5 1.7 - 7.7 K/uL   Lymphocytes Relative 21 %   Lymphs Abs 1.5 0.7 - 4.0 K/uL   Monocytes Relative 11 %   Monocytes Absolute 0.8 0.1 - 1.0 K/uL   Eosinophils Relative 6 %   Eosinophils Absolute 0.4 0.0 - 0.5 K/uL   Basophils Relative 1 %   Basophils Absolute 0.0 0.0 - 0.1 K/uL   Immature Granulocytes 0 %   Abs Immature Granulocytes 0.03 0.00 - 0.07 K/uL    Comment: Performed at [facility], [address] [place], [place] [address]  I-Stat beta hCG blood, ED     Status: None   Collection Time: 07/14/18  4:55 PM  Result Value Ref Range   I-stat hCG, quantitative <5.0 <5 mIU/mL   Comment 3            Comment:   GEST. AGE      CONC.  (mIU/mL)   <=1 WEEK        5 - 50     2 WEEKS       50 - 500     3 WEEKS       100 - 10,000     4 WEEKS     1,000 - 30,000         FEMALE AND NON-PREGNANT FEMALE:     LESS THAN 5 mIU/mL    No results found.  Pending Labs Unresulted Labs (From admission, onward)    Start     Ordered   07/14/18 1742  Protime-INR  ONCE - STAT,   STAT     07/14/18 1742   07/14/18 1742  APTT  ONCE - STAT,   STAT     07/14/18 1742          Vitals/Pain Today's Vitals   07/14/18 1409 07/14/18 1418 07/14/18 1517 07/14/18 1730  BP: 128/67   129/90  Pulse: 74   73  Resp: 19     Temp: 98.2 F (36.8 C)     TempSrc: Oral     SpO2: 97%   99%  Weight:  133.8 kg    Height:  5\' 9"  (1.753 m)    PainSc:  10-Worst pain ever 10-Worst pain ever     Isolation Precautions No active isolations  Medications Medications  morphine 4 MG/ML injection 4 mg (4 mg Intravenous Given 07/14/18 1739)    Mobility walks with device Moderate fall risk   Focused Assessments    R Recommendations: See Admitting Provider Note  Report given to:   Additional Notes:

## 2018-07-14 NOTE — ED Notes (Signed)
Pt has made contact with numerous family members since she has been in the ED. Updates provided.

## 2018-07-14 NOTE — ED Provider Notes (Signed)
[facility] EMERGENCY DEPARTMENT Provider Note   CSN: [id number] Arrival date & time: 07/14/18  1357    History   Chief Complaint Chief Complaint  Patient presents with  . Back Pain    HPI Megan [name] is a 38 y.o. female with a PMH of Cirrhosis, Dyslipidemia, GERD, Hepatitis C s/p treatment, PCOS, and PTSD presenting with constant left sharp back pain radiating down left leg onset 4 months ago. Patient reports pain has worsened over the last 2-3 weeks and it has significantly worsened today. Patient reports she has been prescribed steroids, Voltaren, pain medicine, muscle relaxants, and gabapentin without relief.  Patient reports she had an MRI on Friday at [place] and states she was told she had a severely herniated disc. Patient states she was advised to follow up with neurosurgery on Tuesday, but pain became severe and she cannot ambulate for longer than 30 seconds. Patient reports left leg weakness and numbness. Denies incontinence to bowel/bladder, fever, chills, IV drug use, or hx of cancer.     HPI  Past Medical History:  Diagnosis Date  . Cirrhosis (HCC)   . Dyslipidemia   . GERD (gastroesophageal reflux disease)   . Hepatitis C    s/p treatment  . Hypercholesteremia   . Obesity   . PCOS (polycystic ovarian syndrome)   . PTSD (post-traumatic stress disorder)   . Steatosis     There are no active problems to display for this patient.   Past Surgical History:  Procedure Laterality Date  . HAND SURGERY Left   . KNEE ARTHROSCOPY Right   . LIVER BIOPSY    . TONSILLECTOMY       OB History   No obstetric history on file.      Home Medications    Prior to Admission medications   Medication Sig Start Date End Date Taking? Authorizing Provider  atorvastatin (LIPITOR) 40 MG tablet Take 40 mg by mouth daily.   Yes [provider]  celecoxib (CELEBREX) 200 MG capsule Take 200 mg by mouth 2 (two) times daily.   Yes [provider]  fluticasone (FLONASE) 50 MCG/ACT nasal spray Place 2 sprays into both nostrils daily as needed for allergies. 11/08/17 11/08/18 Yes [provider]  gabapentin (NEURONTIN) 300 MG capsule Take 600 mg by mouth 3 (three) times daily.   Yes [provider]  hydrOXYzine (VISTARIL) 50 MG capsule Take 50 mg by mouth 3 (three) times daily as needed for itching.   Yes [provider]  lisinopril (ZESTRIL) 20 MG tablet Take 20 mg by mouth daily.   Yes [provider]  methocarbamol (ROBAXIN) 500 MG tablet Take 500 mg by mouth 2 (two) times a day.   Yes [provider]  omeprazole (PRILOSEC) 20 MG capsule Take 20 mg by mouth daily.    Yes [provider]  Oxycodone HCl 10 MG TABS Take 10 mg by mouth 4 (four) times daily.   Yes [provider]  timolol (BETIMOL) 0.5 % ophthalmic solution Place 1 drop into both eyes 2 (two) times daily.   Yes [provider]  aspirin EC 81 MG tablet Take 1 tablet (81 mg total) by mouth daily. Patient not taking: Reported on 07/14/2018 03/29/16   [name], PA  elvitegravir-cobicistat-emtricitabine-tenofovir (GENVOYA) 150-150-200-10 MG TABS tablet Take 1 tablet by mouth daily with breakfast. Patient not taking: Reported on 07/14/2018 09/25/17   Willy Eddy, MD    Family History Family History  Problem Relation Age  of Onset  . High blood pressure Mother   . Heart attack Father   . High blood pressure Father   . Heart attack Paternal Grandfather        53s  . Colon cancer Maternal Grandfather     Social History Social History   Tobacco Use  . Smoking status: Former Smoker    Packs/day: 0.50    Years: 15.00    Pack years: 7.50    Types: Cigarettes    Last attempt to quit: 03/30/2015    Years since quitting: 3.2  . Smokeless tobacco: Never Used  Substance Use Topics  . Alcohol use: Yes    Comment: social  . Drug use: No     Allergies   Penicillins and Varenicline   Review of  Systems Review of Systems  Constitutional: Negative for activity change, chills, diaphoresis, fever and unexpected weight change.  Respiratory: Negative for cough and shortness of breath.   Cardiovascular: Negative for chest pain, palpitations and leg swelling.  Gastrointestinal: Negative for abdominal pain, constipation, diarrhea, nausea and vomiting.  Genitourinary: Negative for difficulty urinating, dysuria, flank pain and hematuria.  Musculoskeletal: Positive for back pain. Negative for arthralgias, gait problem, joint swelling, myalgias, neck pain and neck stiffness.  Skin: Negative for rash.  Neurological: Positive for weakness and numbness. Negative for dizziness and syncope.  Hematological: Does not bruise/bleed easily.     Physical Exam Updated Vital Signs BP 129/90   Pulse 73   Temp 98.2 F (36.8 C) (Oral)   Resp 19   Ht  (1.753 m)   Wt 133.8 kg   SpO2 99%   BMI 43.56 kg/m   Physical Exam Physical Exam  Constitutional: Pt appears well-developed and well-nourished. No distress. Patient appears uncomfortable.  HENT:  Head: Normocephalic and atraumatic.  Mouth/Throat: Oropharynx is clear and moist. No oropharyngeal exudate.  Eyes: Conjunctivae are normal.  Neck: Normal range of motion. Neck supple. Full ROM without pain. No cervical midline or paraspinal tenderness noted. Cardiovascular: Normal rate, regular rhythm and intact distal pulses.   Pulmonary/Chest: Effort normal and breath sounds normal. No respiratory distress. Pt has no wheezes.  Abdominal: Soft. Pt exhibits no distension. There is no tenderness.  Musculoskeletal:  Full range of motion of the T-spine. Decreased ROM of L-spine. No tenderness to palpation of the spinous processes of the T-spine. Tenderness to palpation of spinous processes of L-spine. Bilateral tenderness to palpation of the paraspinous muscles of the L-spine. Positive straight leg test.  Neurological: Pt is alert. Speech is clear and  goal oriented, follows commands. Pt has normal reflexes. Reflex Scores:      Patellar reflexes are 2+ on the right side and 2+ on the left side.      Achilles reflexes are 2+ on the right side and 2+ on the left side. Normal 5/5 strength in upper and lower extremities bilaterally including dorsiflexion and plantar flexion, strong and equal grip strength. Sensation is normal to light touch in right lower extremity. Decreased sensation in left lower extremity. Moves extremities without ataxia, coordination intact. Patient has severe pain with ambulation and is only able to ambulate with crutches.  Skin: Skin is warm and dry. No rash, erythema, edema, or skin changes noted. Pt is not diaphoretic.  Psychiatric: Pt has a normal mood and affect. Behavior is normal.  Nursing note and vitals reviewed.  ED Treatments / Results  Labs (all labs ordered are listed, but only abnormal results are displayed) Labs Reviewed  BASIC  METABOLIC PANEL - Abnormal; Notable for the following components:      Result Value   Glucose, Bld 104 (*)    All other components within normal limits  CBC WITH DIFFERENTIAL/PLATELET - Abnormal; Notable for the following components:   MCV 104.0 (*)    All other components within normal limits  SARS CORONAVIRUS 2 (HOSPITAL ORDER, PERFORMED IN [facility])  PROTIME-INR  APTT  I-STAT BETA HCG BLOOD, ED ([facility], [facility], [facility] ONLY)    EKG None  Radiology No results found.  Procedures Procedures (including critical care time)  Medications Ordered in ED Medications  HYDROmorphone (DILAUDID) injection 0.5 mg (0.5 mg Intravenous Given 07/14/18 1817)  morphine 4 MG/ML injection 4 mg (4 mg Intravenous Given 07/14/18 1739)     Initial Impression / Assessment and Plan / ED Course  I have reviewed the triage vital signs and the nursing notes.  Pertinent labs & imaging results that were available during my care of the patient were reviewed by me and considered in my medical  decision making (see chart for details).       Patient presents with back pain. Labs and vitals reviewed. Pain controlled while in the ER. Patient had an MRI on 07/12/2018 which reveals a large L5-S1 disc protrusion with left greater than right lateral recess stenosis and S1 nerve root impingement. Do not suspect further imaging is required at this time. Consulted neurosurgery. Neurosurgery evaluated patient and states they will perform surgery tomorrow. Patient will need to be admitted. Consulted hospitalist for admission. Hospitalist has agreed to admit patient.   Findings and plan of care discussed with supervising physician Dr. Adela LankFloyd.  Final Clinical Impressions(s) / ED Diagnoses   Final diagnoses:  Acute left-sided low back pain with left-sided sciatica  Protrusion of lumbar intervertebral disc    ED Discharge Orders    None       Leretha DykesHernandez, Yosgar Demirjian P, PA-C 07/14/18 1827    Melene PlanFloyd, Dan, DO 07/14/18 1944

## 2018-07-14 NOTE — ED Triage Notes (Signed)
Pt arrives POV for eval of lower L sided back pain and L leg pain. Pt reports she recently had MRI at [place] on Friday and was dx'd w/ "severely herniated, crushed disc". Pt reports she is supposed to follow up with neurosurg on Tuesday, but cannot make it to that time because of the pain. She states she is no longer ambulatory, denies loss of bowel/bladder, denies saddle paresthesias. Pt requested to be brought in on a stretcher from car, placed in recliner in position of comfort. Pt also endorses L leg numbness/tingling which has been ongoing for 2-3 weeks.

## 2018-07-15 ENCOUNTER — Encounter (HOSPITAL_COMMUNITY): Payer: Self-pay

## 2018-07-15 ENCOUNTER — Inpatient Hospital Stay (HOSPITAL_COMMUNITY): Payer: Medicaid Other | Admitting: Anesthesiology

## 2018-07-15 ENCOUNTER — Inpatient Hospital Stay (HOSPITAL_COMMUNITY): Payer: Medicaid Other

## 2018-07-15 ENCOUNTER — Encounter (HOSPITAL_COMMUNITY)
Admission: EM | Disposition: A | Discharge status: Home / Self Care | Payer: Self-pay | Source: Home / Self Care | Attending: Neurological Surgery

## 2018-07-15 HISTORY — PX: LUMBAR LAMINECTOMY/DECOMPRESSION MICRODISCECTOMY: SHX5026

## 2018-07-15 IMAGING — RF LUMBAR SPINE - 2-3 VIEW
1 series · 3 of 3 positions shown · non-contrast
Comparison: Lumbar spine MRI [DATE].

CLINICAL DATA: L5-S1 microdiscectomy.

EXAM:
DG C-ARM 61-120 MIN; LUMBAR SPINE - 2-3 VIEW

[Series 1: run · 3 of 3 slices shown]
[im 1/3]
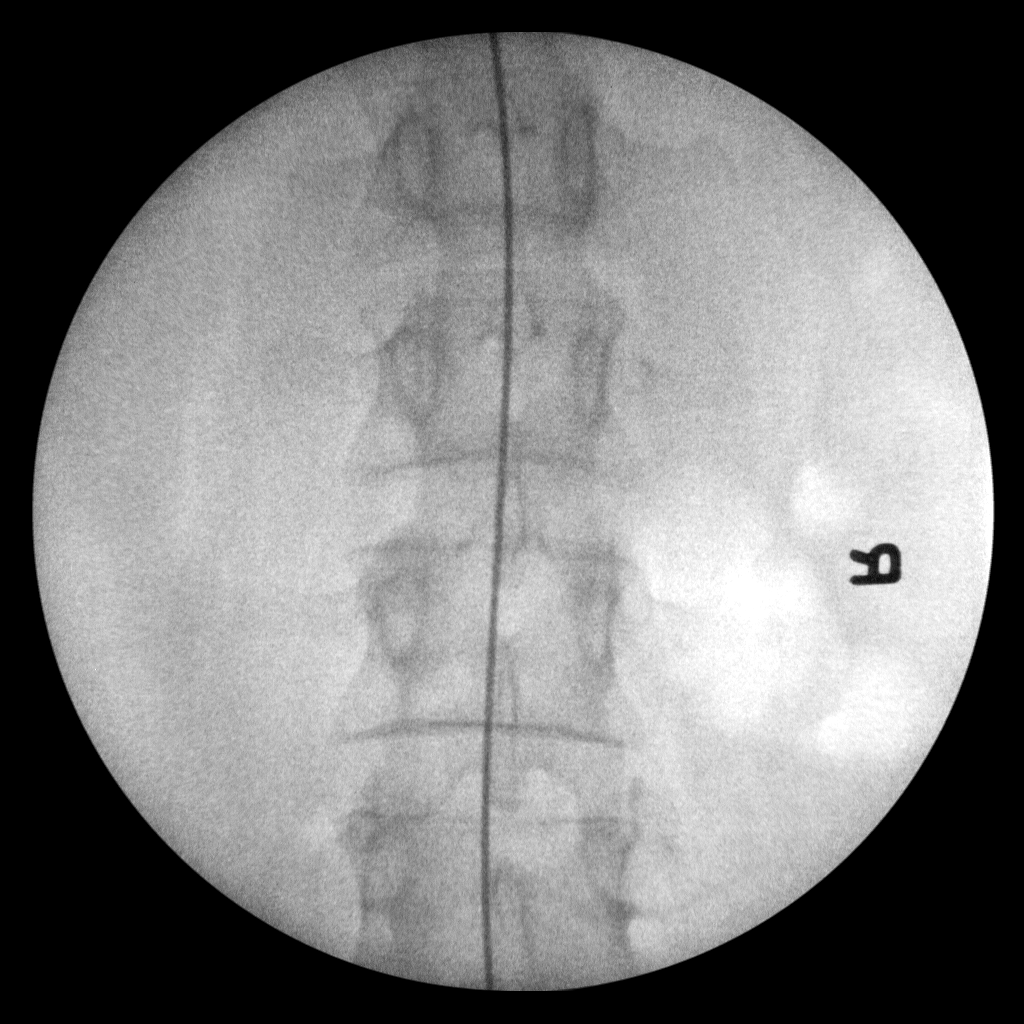
[im 2/3]
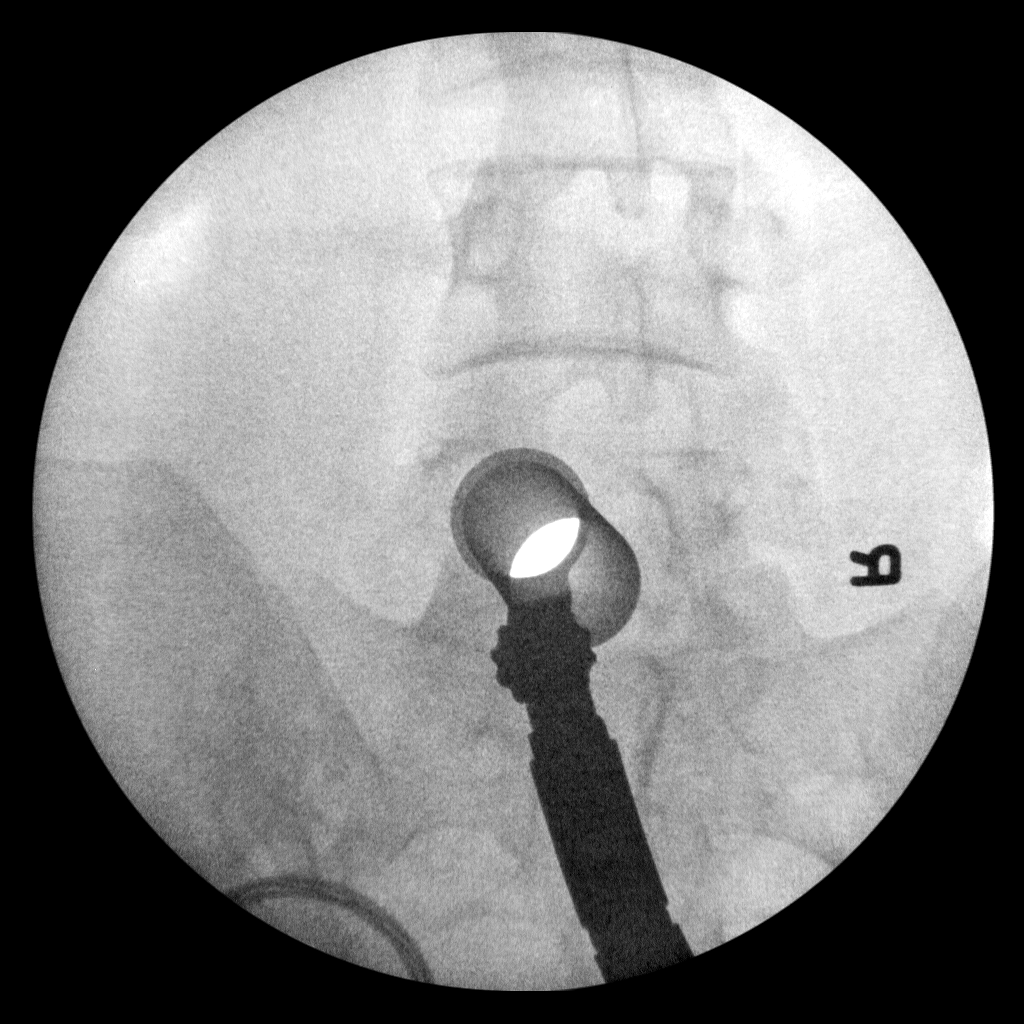
[im 3/3]
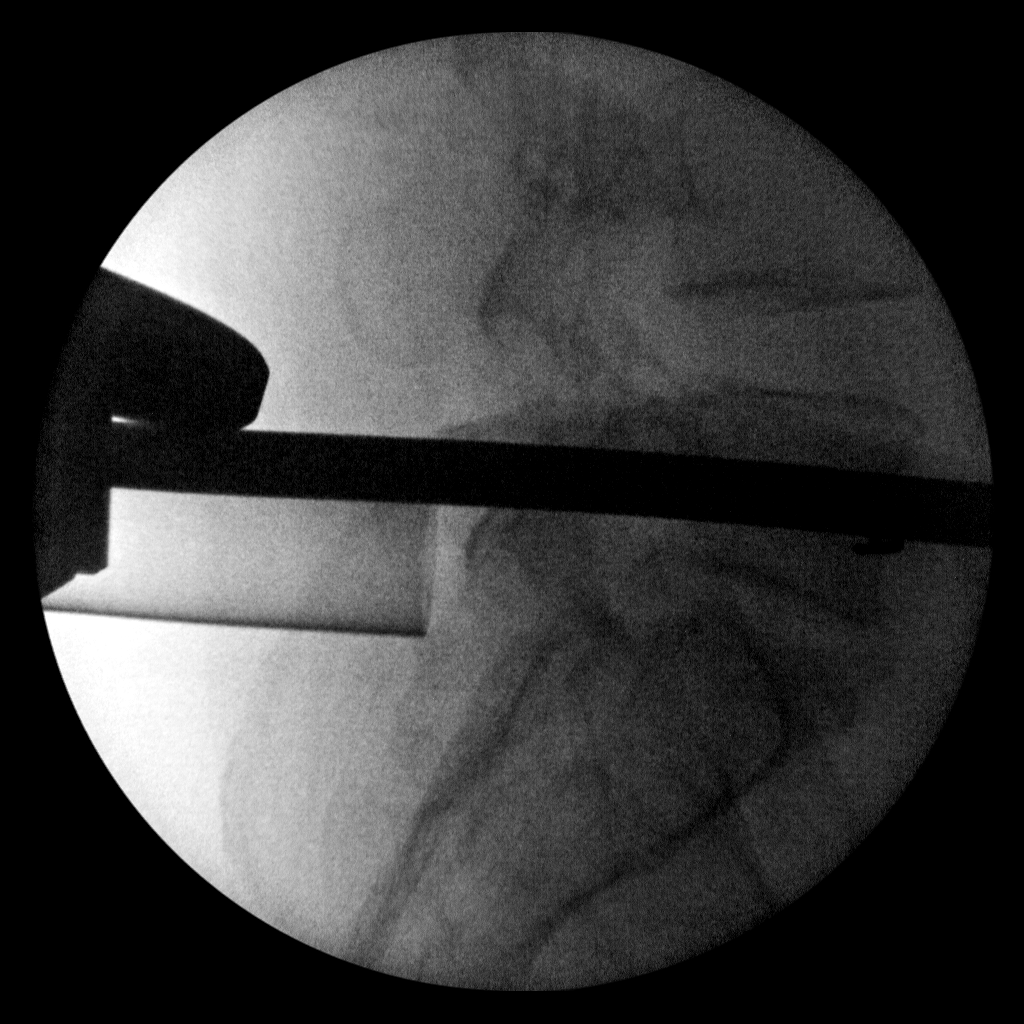

[3 of 3 positions shown; findings below may reference images not displayed]

FINDINGS: Using same numbering scheme as on the recent MRI, the final 2 images
show surgical hardware overlying the L5-S1 region posteriorly.
IMPRESSION: Intraoperative localization during L5-S1 microdiscectomy.

## 2018-07-15 SURGERY — LUMBAR LAMINECTOMY/DECOMPRESSION MICRODISCECTOMY 1 LEVEL
Anesthesia: General | Site: Back

## 2018-07-15 MED ORDER — BUPIVACAINE HCL (PF) 0.5 % IJ SOLN
INTRAMUSCULAR | Status: AC
  Filled 2018-07-15: qty 30

## 2018-07-15 MED ORDER — SUCCINYLCHOLINE CHLORIDE 200 MG/10ML IV SOSY
PREFILLED_SYRINGE | INTRAVENOUS | Status: AC
  Filled 2018-07-15: qty 10

## 2018-07-15 MED ORDER — HYDROMORPHONE HCL 1 MG/ML IJ SOLN: 0.5000 mg | INTRAMUSCULAR | Status: DC | PRN

## 2018-07-15 MED ORDER — SUCCINYLCHOLINE CHLORIDE 200 MG/10ML IV SOSY
PREFILLED_SYRINGE | INTRAVENOUS | Status: DC | PRN
  Administered 2018-07-15: 120 mg via INTRAVENOUS

## 2018-07-15 MED ORDER — 0.9 % SODIUM CHLORIDE (POUR BTL) OPTIME
TOPICAL | Status: DC | PRN
  Administered 2018-07-15: 16:00:00 1000 mL

## 2018-07-15 MED ORDER — PHENYLEPHRINE 40 MCG/ML (10ML) SYRINGE FOR IV PUSH (FOR BLOOD PRESSURE SUPPORT)
PREFILLED_SYRINGE | INTRAVENOUS | Status: DC | PRN
  Administered 2018-07-15: 80 ug via INTRAVENOUS
  Administered 2018-07-15 (×2): 40 ug via INTRAVENOUS
  Administered 2018-07-15: 80 ug via INTRAVENOUS

## 2018-07-15 MED ORDER — MIDAZOLAM HCL 2 MG/2ML IJ SOLN
INTRAMUSCULAR | Status: AC
  Filled 2018-07-15: qty 2

## 2018-07-15 MED ORDER — SODIUM CHLORIDE (PF) 0.9 % IJ SOLN
INTRAMUSCULAR | Status: AC
  Filled 2018-07-15: qty 10

## 2018-07-15 MED ORDER — ROCURONIUM BROMIDE 10 MG/ML (PF) SYRINGE
PREFILLED_SYRINGE | INTRAVENOUS | Status: DC | PRN
  Administered 2018-07-15: 70 mg via INTRAVENOUS

## 2018-07-15 MED ORDER — MIDAZOLAM HCL 5 MG/5ML IJ SOLN
INTRAMUSCULAR | Status: DC | PRN
  Administered 2018-07-15: 2 mg via INTRAVENOUS

## 2018-07-15 MED ORDER — FENTANYL CITRATE (PF) 100 MCG/2ML IJ SOLN
INTRAMUSCULAR | Status: AC
  Administered 2018-07-15: 18:00:00 50 ug via INTRAVENOUS
  Filled 2018-07-15: qty 2

## 2018-07-15 MED ORDER — ONDANSETRON HCL 4 MG/2ML IJ SOLN
INTRAMUSCULAR | Status: AC
  Filled 2018-07-15: qty 2

## 2018-07-15 MED ORDER — LIDOCAINE 2% (20 MG/ML) 5 ML SYRINGE
INTRAMUSCULAR | Status: AC
  Filled 2018-07-15: qty 5

## 2018-07-15 MED ORDER — PHENYLEPHRINE 40 MCG/ML (10ML) SYRINGE FOR IV PUSH (FOR BLOOD PRESSURE SUPPORT)
PREFILLED_SYRINGE | INTRAVENOUS | Status: AC
  Filled 2018-07-15: qty 10

## 2018-07-15 MED ORDER — PROPOFOL 10 MG/ML IV BOLUS
INTRAVENOUS | Status: DC | PRN
  Administered 2018-07-15: 50 mg via INTRAVENOUS
  Administered 2018-07-15: 150 mg via INTRAVENOUS

## 2018-07-15 MED ORDER — OXYCODONE HCL 5 MG PO TABS: 5.0000 mg | ORAL_TABLET | ORAL | Status: DC | PRN

## 2018-07-15 MED ORDER — SUGAMMADEX SODIUM 200 MG/2ML IV SOLN
INTRAVENOUS | Status: DC | PRN
  Administered 2018-07-15: 200 mg via INTRAVENOUS

## 2018-07-15 MED ORDER — PROMETHAZINE HCL 25 MG/ML IJ SOLN: 6.2500 mg | INTRAMUSCULAR | Status: DC | PRN

## 2018-07-15 MED ORDER — MIDAZOLAM HCL 2 MG/2ML IJ SOLN
1.0000 mg | Freq: Once | INTRAMUSCULAR | Status: AC
  Administered 2018-07-15: 1 mg via INTRAVENOUS

## 2018-07-15 MED ORDER — SODIUM CHLORIDE 0.9 % IV SOLN
INTRAVENOUS | Status: DC | PRN
  Administered 2018-07-15: 16:00:00

## 2018-07-15 MED ORDER — PROPOFOL 10 MG/ML IV BOLUS
INTRAVENOUS | Status: AC
  Filled 2018-07-15: qty 20

## 2018-07-15 MED ORDER — ACETAMINOPHEN 500 MG PO TABS
ORAL_TABLET | ORAL | Status: AC
  Administered 2018-07-15: 12:00:00 1000 mg via ORAL
  Filled 2018-07-15: qty 2

## 2018-07-15 MED ORDER — DEXAMETHASONE SODIUM PHOSPHATE 10 MG/ML IJ SOLN
INTRAMUSCULAR | Status: DC | PRN
  Administered 2018-07-15: 5 mg via INTRAVENOUS

## 2018-07-15 MED ORDER — ONDANSETRON HCL 4 MG/2ML IJ SOLN
INTRAMUSCULAR | Status: DC | PRN
  Administered 2018-07-15: 4 mg via INTRAVENOUS

## 2018-07-15 MED ORDER — VANCOMYCIN HCL 1000 MG IV SOLR
INTRAVENOUS | Status: DC | PRN
  Administered 2018-07-15: 1000 mg via INTRAVENOUS

## 2018-07-15 MED ORDER — FENTANYL CITRATE (PF) 100 MCG/2ML IJ SOLN
INTRAMUSCULAR | Status: AC
  Filled 2018-07-15: qty 2

## 2018-07-15 MED ORDER — ARTIFICIAL TEARS OPHTHALMIC OINT
TOPICAL_OINTMENT | OPHTHALMIC | Status: AC
  Filled 2018-07-15: qty 3.5

## 2018-07-15 MED ORDER — LACTATED RINGERS IV SOLN
INTRAVENOUS | Status: DC
  Administered 2018-07-15: 13:00:00 via INTRAVENOUS

## 2018-07-15 MED ORDER — LIDOCAINE-EPINEPHRINE 1 %-1:100000 IJ SOLN
INTRAMUSCULAR | Status: AC
  Filled 2018-07-15: qty 1

## 2018-07-15 MED ORDER — ROCURONIUM BROMIDE 10 MG/ML (PF) SYRINGE
PREFILLED_SYRINGE | INTRAVENOUS | Status: AC
  Filled 2018-07-15: qty 10

## 2018-07-15 MED ORDER — METHYLPREDNISOLONE ACETATE 80 MG/ML IJ SUSP
INTRAMUSCULAR | Status: AC
  Filled 2018-07-15: qty 1

## 2018-07-15 MED ORDER — VANCOMYCIN HCL IN DEXTROSE 1-5 GM/200ML-% IV SOLN
INTRAVENOUS | Status: AC
  Filled 2018-07-15: qty 200

## 2018-07-15 MED ORDER — HYDROMORPHONE HCL 1 MG/ML IJ SOLN
INTRAMUSCULAR | Status: AC
  Administered 2018-07-15: 18:00:00 0.5 mg via INTRAVENOUS
  Filled 2018-07-15: qty 1

## 2018-07-15 MED ORDER — THROMBIN 5000 UNITS EX SOLR
OROMUCOSAL | Status: DC | PRN
  Administered 2018-07-15: 16:00:00 via TOPICAL

## 2018-07-15 MED ORDER — BUPIVACAINE HCL (PF) 0.5 % IJ SOLN
INTRAMUSCULAR | Status: DC | PRN
  Administered 2018-07-15: 5 mL

## 2018-07-15 MED ORDER — METHOCARBAMOL 500 MG PO TABS
500.0000 mg | ORAL_TABLET | Freq: Four times a day (QID) | ORAL | Status: DC | PRN
  Administered 2018-07-15 – 2018-07-16 (×2): 500 mg via ORAL
  Filled 2018-07-15: qty 1

## 2018-07-15 MED ORDER — FENTANYL CITRATE (PF) 250 MCG/5ML IJ SOLN
INTRAMUSCULAR | Status: AC
  Filled 2018-07-15: qty 5

## 2018-07-15 MED ORDER — FENTANYL CITRATE (PF) 250 MCG/5ML IJ SOLN
INTRAMUSCULAR | Status: DC | PRN
  Administered 2018-07-15: 50 ug via INTRAVENOUS
  Administered 2018-07-15 (×2): 100 ug via INTRAVENOUS

## 2018-07-15 MED ORDER — SCOPOLAMINE 1 MG/3DAYS TD PT72
1.0000 | MEDICATED_PATCH | TRANSDERMAL | Status: DC
  Administered 2018-07-15: 12:00:00 1.5 mg via TRANSDERMAL

## 2018-07-15 MED ORDER — HYDROMORPHONE HCL 1 MG/ML IJ SOLN
0.2500 mg | INTRAMUSCULAR | Status: AC | PRN
  Administered 2018-07-15 (×4): 0.5 mg via INTRAVENOUS

## 2018-07-15 MED ORDER — SCOPOLAMINE 1 MG/3DAYS TD PT72
MEDICATED_PATCH | TRANSDERMAL | Status: AC
  Administered 2018-07-15: 1.5 mg via TRANSDERMAL
  Filled 2018-07-15: qty 1

## 2018-07-15 MED ORDER — OXYCODONE HCL 5 MG PO TABS
10.0000 mg | ORAL_TABLET | ORAL | Status: DC | PRN
  Administered 2018-07-15 – 2018-07-16 (×4): 10 mg via ORAL
  Filled 2018-07-15 (×4): qty 2

## 2018-07-15 MED ORDER — ALBUTEROL SULFATE HFA 108 (90 BASE) MCG/ACT IN AERS
INHALATION_SPRAY | RESPIRATORY_TRACT | Status: DC | PRN
  Administered 2018-07-15: 4 via RESPIRATORY_TRACT

## 2018-07-15 MED ORDER — DEXAMETHASONE SODIUM PHOSPHATE 10 MG/ML IJ SOLN
INTRAMUSCULAR | Status: AC
  Filled 2018-07-15: qty 1

## 2018-07-15 MED ORDER — FENTANYL CITRATE (PF) 100 MCG/2ML IJ SOLN
25.0000 ug | INTRAMUSCULAR | Status: DC | PRN
  Administered 2018-07-15 (×3): 50 ug via INTRAVENOUS

## 2018-07-15 MED ORDER — ACETAMINOPHEN 500 MG PO TABS
1000.0000 mg | ORAL_TABLET | Freq: Once | ORAL | Status: AC
  Administered 2018-07-15: 12:00:00 1000 mg via ORAL

## 2018-07-15 MED ORDER — THROMBIN 5000 UNITS EX SOLR
CUTANEOUS | Status: AC
  Filled 2018-07-15: qty 5000

## 2018-07-15 MED ORDER — LIDOCAINE 2% (20 MG/ML) 5 ML SYRINGE
INTRAMUSCULAR | Status: DC | PRN
  Administered 2018-07-15: 100 mg via INTRAVENOUS

## 2018-07-15 MED ORDER — LIDOCAINE-EPINEPHRINE 1 %-1:100000 IJ SOLN
INTRAMUSCULAR | Status: DC | PRN
  Administered 2018-07-15: 5 mL

## 2018-07-15 SURGICAL SUPPLY — 49 items
ADH SKN CLS APL DERMABOND .7 (GAUZE/BANDAGES/DRESSINGS) ×1
BAG DECANTER FOR FLEXI CONT (MISCELLANEOUS) ×2 IMPLANT
BLADE SURG 11 STRL SS (BLADE) ×2 IMPLANT
BUR 2.5 MTCH HD 16 (BUR) ×1 IMPLANT
BUR MATCHSTICK NEURO 3.0 LAGG (BURR) ×1 IMPLANT
BUR PRECISION FLUTE 5.0 (BURR) ×1 IMPLANT
CANISTER SUCT 3000ML PPV (MISCELLANEOUS) ×2 IMPLANT
DECANTER SPIKE VIAL GLASS SM (MISCELLANEOUS) ×2 IMPLANT
DERMABOND ADVANCED (GAUZE/BANDAGES/DRESSINGS) ×1
DERMABOND ADVANCED .7 DNX12 (GAUZE/BANDAGES/DRESSINGS) ×1 IMPLANT
DRAPE C-ARM 42X72 X-RAY (DRAPES) ×4 IMPLANT
DRAPE LAPAROTOMY 100X72X124 (DRAPES) ×2 IMPLANT
DRAPE MICROSCOPE LEICA (MISCELLANEOUS) ×2 IMPLANT
DRAPE SURG 17X23 STRL (DRAPES) ×2 IMPLANT
DURAPREP 26ML APPLICATOR (WOUND CARE) ×2 IMPLANT
ELECT BLADE 6.5 EXT (BLADE) ×1 IMPLANT
ELECT REM PT RETURN 9FT ADLT (ELECTROSURGICAL) ×2
ELECTRODE REM PT RTRN 9FT ADLT (ELECTROSURGICAL) ×1 IMPLANT
GAUZE 4X4 16PLY RFD (DISPOSABLE) IMPLANT
GAUZE SPONGE 4X4 12PLY STRL (GAUZE/BANDAGES/DRESSINGS) IMPLANT
GLOVE BIO SURGEON STRL SZ7.5 (GLOVE) ×2 IMPLANT
GLOVE BIOGEL PI IND STRL 7.5 (GLOVE) ×1 IMPLANT
GLOVE BIOGEL PI IND STRL 8 (GLOVE) IMPLANT
GLOVE BIOGEL PI INDICATOR 7.5 (GLOVE) ×1
GLOVE BIOGEL PI INDICATOR 8 (GLOVE) ×3
GLOVE ECLIPSE 7.5 STRL STRAW (GLOVE) ×3 IMPLANT
GOWN STRL REUS W/ TWL LRG LVL3 (GOWN DISPOSABLE) ×2 IMPLANT
GOWN STRL REUS W/ TWL XL LVL3 (GOWN DISPOSABLE) IMPLANT
GOWN STRL REUS W/TWL 2XL LVL3 (GOWN DISPOSABLE) ×2 IMPLANT
GOWN STRL REUS W/TWL LRG LVL3 (GOWN DISPOSABLE) ×2
GOWN STRL REUS W/TWL XL LVL3 (GOWN DISPOSABLE)
HEMOSTAT POWDER KIT SURGIFOAM (HEMOSTASIS) ×2 IMPLANT
KIT BASIN OR (CUSTOM PROCEDURE TRAY) ×2 IMPLANT
KIT TURNOVER KIT B (KITS) ×2 IMPLANT
NDL SPNL 18GX3.5 QUINCKE PK (NEEDLE) ×1 IMPLANT
NEEDLE HYPO 22GX1.5 SAFETY (NEEDLE) ×2 IMPLANT
NEEDLE SPNL 18GX3.5 QUINCKE PK (NEEDLE) ×2 IMPLANT
NS IRRIG 1000ML POUR BTL (IV SOLUTION) ×2 IMPLANT
PACK LAMINECTOMY NEURO (CUSTOM PROCEDURE TRAY) ×2 IMPLANT
PAD ARMBOARD 7.5X6 YLW CONV (MISCELLANEOUS) ×7 IMPLANT
RUBBERBAND STERILE (MISCELLANEOUS) ×4 IMPLANT
SPONGE LAP 4X18 RFD (DISPOSABLE) IMPLANT
SUT MNCRL AB 3-0 PS2 18 (SUTURE) ×2 IMPLANT
SUT VIC AB 0 CT1 18XCR BRD8 (SUTURE) ×1 IMPLANT
SUT VIC AB 0 CT1 8-18 (SUTURE) ×2
SUT VIC AB 2-0 CT2 18 VCP726D (SUTURE) ×2 IMPLANT
TOWEL GREEN STERILE (TOWEL DISPOSABLE) ×2 IMPLANT
TOWEL GREEN STERILE FF (TOWEL DISPOSABLE) ×2 IMPLANT
WATER STERILE IRR 1000ML POUR (IV SOLUTION) ×2 IMPLANT

## 2018-07-15 NOTE — Progress Notes (Signed)
Attempted to obtain an EKG due to pt having a history of HTN, and last one obtained two years ago. Per Dr. Krista Blue, the EKG is not needed.

## 2018-07-15 NOTE — Progress Notes (Signed)
Neurosurgery Service Post-operative progress note  Assessment & Plan: 38 y.o. woman s/p L5-S1 MIS microdiscectomy. Seen in PACU, sensation improving, radicular pain present but no longer extending down to the toes, just into the buttock and proximal leg, improved from preop, strength intact on exam, recovering well.  Jadene Pierini  07/15/18 6:06 PM

## 2018-07-15 NOTE — Anesthesia Postprocedure Evaluation (Signed)
Anesthesia Post Note  Patient: Megan [name]  Procedure(s) Performed: L5-S1 MINIMALLY INVASIVE LUMBAR DISCECTOMY (N/A Back)     Patient location during evaluation: PACU Anesthesia Type: General Level of consciousness: sedated Pain management: pain level controlled Vital Signs Assessment: post-procedure vital signs reviewed and stable Respiratory status: spontaneous breathing and respiratory function stable Cardiovascular status: stable Postop Assessment: no apparent nausea or vomiting Anesthetic complications: no    Last Vitals:  Vitals:   07/15/18 1813 07/15/18 1823  BP: 123/64 115/75  Pulse: 69 79  Resp: 17 20  Temp:  (!) 36.2 C  SpO2: 98% 94%    Last Pain:  Vitals:   07/15/18 1815  TempSrc:   PainSc: Asleep                 Dixie Coppa DANIEL

## 2018-07-15 NOTE — Anesthesia Procedure Notes (Addendum)
Procedure Name: Intubation Date/Time: 07/15/2018 2:34 PM Performed by: Elliot Dally, CRNA Pre-anesthesia Checklist: Patient identified, Emergency Drugs available, Suction available and Patient being monitored Patient Re-evaluated:Patient Re-evaluated prior to induction Oxygen Delivery Method: Circle System Utilized Preoxygenation: Pre-oxygenation with 100% oxygen Induction Type: IV induction Laryngoscope Size: Miller and 2 Grade View: Grade I Tube type: Oral Tube size: 7.0 mm Number of attempts: 1 Airway Equipment and Method: Stylet and Oral airway Placement Confirmation: ETT inserted through vocal cords under direct vision,  positive ETCO2 and breath sounds checked- equal and bilateral Secured at: 22 cm Tube secured with: Tape Dental Injury: Teeth and Oropharynx as per pre-operative assessment

## 2018-07-15 NOTE — Brief Op Note (Signed)
07/15/2018  5:15 PM  PATIENT:  Megan [name]  38 y.o. female  PRE-OPERATIVE DIAGNOSIS:  Herniated nucleus pulposus  POST-OPERATIVE DIAGNOSIS:  Same  PROCEDURE:  Procedure(s): L5-S1 MINIMALLY INVASIVE LUMBAR DISCECTOMY (N/A)  SURGEON:  Surgeon(s) and Role:    * Jadene Pierini, MD - Primary  ASSISTANTS: none   ANESTHESIA:   general  EBL:  50 mL   BLOOD ADMINISTERED:none  DRAINS: none   LOCAL MEDICATIONS USED:  LIDOCAINE   SPECIMEN:  No Specimen  DISPOSITION OF SPECIMEN:  N/A  COUNTS:  YES  TOURNIQUET:  * No tourniquets in log *  DICTATION: .Note written in EPIC  PLAN OF CARE: Admit to inpatient   PATIENT DISPOSITION:  PACU - hemodynamically stable.   Delay start of Pharmacological VTE agent (>24hrs) due to surgical blood loss or risk of bleeding: yes

## 2018-07-15 NOTE — Op Note (Signed)
PATIENT: Megan [name]  DAY OF SURGERY: 07/15/18   PRE-OPERATIVE DIAGNOSIS:  Herniated nucleus pulposus with lumbar radiculopathy   POST-OPERATIVE DIAGNOSIS:  Herniated nucleus pulposus with lumbar radiculopathy   PROCEDURE:  Minimally invasive L5-S1 microdiscectomy   SURGEON:  Surgeon(s) and Role:    Jadene Pierini, MD - Primary   ANESTHESIA: ETGA   BRIEF HISTORY: This is a 38 year old woman who presented with progressive lower extremity pain that was not controlled by medications and non-surgical treatment. She came to the ED because she was non-ambulatory due to severe pain. An MRI showed lipomatosis along with a large disc herniation that was causing significant stenosis that correlated with her symptoms. I therefore recommended a minimally invasive L5-S1 MIS discectomy. This was discussed with the patient as well as risks, benefits, and alternatives and the patient wished to proceed with surgical treatment.    OPERATIVE DETAIL: The patient was taken to the operating room and placed on the OR table in the prone position. A formal time out was performed with two patient identifiers and confirmed the operative site. Anesthesia was induced by the anesthesia team. The operative site was marked, hair was clipped with surgical clippers, the area was then prepped and draped in a sterile fashion. Fluoroscopy was used to identify the surgical level.   A 2cm incision was then marked 1cm off to the left of midline. The fascia was incised sharply and serial dilators were docked to the lamina-facet junction of L5-S1 on the left. A final tubular distractor was placed and secured to the table and fluoro again confirmed the correct surgical level. The anatomy was confirmed with palpation, soft tissues removed with cautery, and a medial facetectomy and laminotomy were performed. The ligamentum flavum was resected and the thecal sac and traversing root were identified, then retracted medially. An annulotomy  was created in the disc with multiple free fragments under pressure that were removed. There was significant calcified disc material around the endplates that were separated and removed as far as possible. The visible portion of the traversing nerve root was well decompressed with no palpable residual compression.   Hemostasis was obtained, the wound was copiously irrigated, and the tube was removed while using the microscope to confirm hemostasis of the muscle edges. All instrument and sponge counts were correct and the incision was then closed in layers. The patient was then returned to anesthesia for emergence. No apparent complications at the completion of the procedure.    EBL:  55mL   DRAINS: none   SPECIMENS: none   Jadene Pierini, MD 07/15/18 5:18 PM

## 2018-07-15 NOTE — Progress Notes (Signed)
Pt reported to this nurse that the physician came by her room this evening and told her that because her pain was still so bad she could stay in bed this shift. She requested a pure wick d/t needing to void. Pure wick placed and pt was educated on the need to get up and mobilize prior to going home. She verbalized understanding. Mayford Knife RN

## 2018-07-15 NOTE — Transfer of Care (Signed)
Immediate Anesthesia Transfer of Care Note  Patient: Megan [name]  Procedure(s) Performed: L5-S1 MINIMALLY INVASIVE LUMBAR DISCECTOMY (N/A Back)  Patient Location: PACU  Anesthesia Type:General  Level of Consciousness: awake  Airway & Oxygen Therapy: Patient Spontanous Breathing and Patient connected to face mask oxygen  Post-op Assessment: Report given to RN and Post -op Vital signs reviewed and stable  Post vital signs: Reviewed and stable  Last Vitals:  Vitals Value Taken Time  BP    Temp    Pulse 81 07/15/2018  5:28 PM  Resp 10 07/15/2018  5:28 PM  SpO2 92 % 07/15/2018  5:28 PM  Vitals shown include unvalidated device data.  Last Pain:  Vitals:   07/15/18 1209  TempSrc:   PainSc: 8       Patients Stated Pain Goal: 2 (07/15/18 0302)  Complications: No apparent anesthesia complications

## 2018-07-15 NOTE — Plan of Care (Signed)

## 2018-07-15 NOTE — Anesthesia Preprocedure Evaluation (Addendum)
Anesthesia Evaluation  Patient identified by MRN, date of birth, ID band Patient awake    Reviewed: Allergy & Precautions, NPO status , Patient's Chart, lab work & pertinent test results  History of Anesthesia Complications Negative for: history of anesthetic complications  Airway Mallampati: II  TM Distance: >3 FB Neck ROM: Full    Dental no notable dental hx. (+) Dental Advisory Given   Pulmonary neg pulmonary ROS, former smoker,    Pulmonary exam normal        Cardiovascular hypertension, Pt. on medications Normal cardiovascular exam     Neuro/Psych PSYCHIATRIC DISORDERS Anxiety negative neurological ROS     GI/Hepatic GERD  Medicated,(+) Hepatitis -, C  Endo/Other  Morbid obesity  Renal/GU negative Renal ROS     Musculoskeletal negative musculoskeletal ROS (+)   Abdominal   Peds  Hematology negative hematology ROS (+)   Anesthesia Other Findings Day of surgery medications reviewed with the patient.  Reproductive/Obstetrics                            Anesthesia Physical Anesthesia Plan  ASA: III  Anesthesia Plan: General   Post-op Pain Management:    Induction: Intravenous  PONV Risk Score and Plan: 4 or greater and Ondansetron, Dexamethasone, Scopolamine patch - Pre-op and Diphenhydramine  Airway Management Planned: Oral ETT  Additional Equipment:   Intra-op Plan:   Post-operative Plan: Extubation in OR  Informed Consent: I have reviewed the patients History and Physical, chart, labs and discussed the procedure including the risks, benefits and alternatives for the proposed anesthesia with the patient or authorized representative who has indicated his/her understanding and acceptance.     Dental advisory given  Plan Discussed with: Anesthesiologist, CRNA and Surgeon  Anesthesia Plan Comments:        Anesthesia Quick Evaluation

## 2018-07-16 ENCOUNTER — Encounter (HOSPITAL_COMMUNITY): Payer: Self-pay | Admitting: Neurological Surgery

## 2018-07-16 MED ORDER — OXYCODONE HCL 10 MG PO TABS: 10.0000 mg | ORAL_TABLET | ORAL | 0 refills | Status: DC | PRN

## 2018-07-16 NOTE — Progress Notes (Signed)
Neurosurgery Service Progress Note  Subjective: No acute events overnight, leg pain improving, stable numbness, back pain well controlled, able to ambulate with a walker   Objective: Vitals:   07/15/18 1943 07/16/18 0423 07/16/18 0747 07/16/18 1153  BP: 118/76 127/76 120/61 (!) 142/56  Pulse: 67 78 80 69  Resp:   20   Temp: 98.9 F (37.2 C) 98.5 F (36.9 C) 98.4 F (36.9 C) 97.9 F (36.6 C)  TempSrc:  Oral  Oral  SpO2: 95% 94% 100% 98%  Weight:      Height:       Temp (24hrs), Avg:98 F (36.7 C), Min:97 F (36.1 C), Max:98.9 F (37.2 C)  CBC Latest Ref Rng & Units 07/14/2018 09/25/2017 06/07/2015  WBC 4.0 - 10.5 K/uL 7.2 5.2 8.0  Hemoglobin 12.0 - 15.0 g/dL 02.4 09.7 35.3  Hematocrit 36.0 - 46.0 % 41.3 36.8 41.0  Platelets 150 - 400 K/uL 166 172 229   BMP Latest Ref Rng & Units 07/14/2018 09/25/2017 12/11/2014  Glucose 70 - 99 mg/dL 299(M) 426(S) 341(D)  BUN 6 - 20 mg/dL 9 8 7   Creatinine 0.44 - 1.00 mg/dL 6.22 2.97 9.89  Sodium 135 - 145 mmol/L 137 139 140  Potassium 3.5 - 5.1 mmol/L 4.5 3.8 3.3(L)  Chloride 98 - 111 mmol/L 106 108 110  CO2 22 - 32 mmol/L 22 24 20(L)  Calcium 8.9 - 10.3 mg/dL 9.1 2.1(J) 8.9    Intake/Output Summary (Last 24 hours) at 07/16/2018 1531 Last data filed at 07/16/2018 0236 Gross per 24 hour  Intake 1447.46 ml  Output 550 ml  Net 897.46 ml    Current Facility-Administered Medications:  .  0.9 %  sodium chloride infusion, , Intravenous, Continuous, Bennett Vanscyoc, Clovis Pu, MD, Stopped at 07/15/18 1113 .  atorvastatin (LIPITOR) tablet 40 mg, 40 mg, Oral, Daily, Jadene Pierini, MD, 40 mg at 07/16/18 0906 .  celecoxib (CELEBREX) capsule 200 mg, 200 mg, Oral, BID, Jadene Pierini, MD, 200 mg at 07/16/18 0908 .  docusate sodium (COLACE) capsule 100 mg, 100 mg, Oral, BID, Jadene Pierini, MD, 100 mg at 07/16/18 0907 .  gabapentin (NEURONTIN) capsule 600 mg, 600 mg, Oral, TID, Jadene Pierini, MD, 600 mg at 07/16/18 0907 .   HYDROmorphone (DILAUDID) injection 0.5 mg, 0.5 mg, Intravenous, Q3H PRN, Jadene Pierini, MD .  hydrOXYzine (ATARAX/VISTARIL) tablet 50 mg, 50 mg, Oral, TID PRN, Jadene Pierini, MD, 50 mg at 07/14/18 2126 .  lisinopril (ZESTRIL) tablet 20 mg, 20 mg, Oral, Daily, Jadene Pierini, MD, 20 mg at 07/16/18 0907 .  menthol-cetylpyridinium (CEPACOL) lozenge 3 mg, 1 lozenge, Oral, PRN **OR** phenol (CHLORASEPTIC) mouth spray 1 spray, 1 spray, Mouth/Throat, PRN, Kylle Lall A, MD .  methocarbamol (ROBAXIN) tablet 500 mg, 500 mg, Oral, BID, Jadene Pierini, MD, 500 mg at 07/16/18 0907 .  methocarbamol (ROBAXIN) tablet 500 mg, 500 mg, Oral, Q6H PRN, Jadene Pierini, MD, 500 mg at 07/16/18 1401 .  ondansetron (ZOFRAN) tablet 4 mg, 4 mg, Oral, Q6H PRN **OR** ondansetron (ZOFRAN) injection 4 mg, 4 mg, Intravenous, Q6H PRN, Sofi Bryars A, MD .  oxyCODONE (Oxy IR/ROXICODONE) immediate release tablet 10 mg, 10 mg, Oral, Q4H PRN, Jadene Pierini, MD, 10 mg at 07/16/18 1317 .  oxyCODONE (Oxy IR/ROXICODONE) immediate release tablet 5 mg, 5 mg, Oral, Q4H PRN, Chay Mazzoni A, MD .  pantoprazole (PROTONIX) EC tablet 40 mg, 40 mg, Oral, Daily, Tayten Bergdoll, Clovis Pu, MD, 40 mg at 07/16/18  [id number] .  timolol (TIMOPTIC) 0.5 % ophthalmic solution 1 drop, 1 drop, Both Eyes, BID, Seline Enzor, Clovis Puhomas A, MD, 1 drop at 07/16/18 0908   Physical Exam: AOx3, PERRL, EOMI, FS, Strength 5/5 x4, SILTx4 Incision c/d/i  Assessment & Plan: 38 y.o. woman s/p MIS microdiscectomy, recovering well.  -can be discharged home today  Jadene Pierinihomas A Timm Bonenberger  07/16/18 3:31 PM

## 2018-07-16 NOTE — Discharge Instructions (Signed)
Discharge Instructions ° °No restriction in activities, slowly increase your activity back to normal.  ° °Your incision is closed with dermabond (purple glue). This will naturally fall off over the next 1-2 weeks.  ° °Okay to shower on the day of discharge. Use regular soap and water and try to be gentle when cleaning your incision.  ° °Follow up with Dr. Zamyra Allensworth in 2 weeks after discharge. If you do not already have a discharge appointment, please call his office at [phone] to schedule a follow up appointment. If you have any concerns or questions, please call the office and let us know. °

## 2018-07-16 NOTE — Discharge Summary (Signed)
Discharge Summary  Date of Admission: 07/14/2018  Date of Discharge: 07/16/18  Attending Physician: Autumn Patty, MD  Hospital Course: Patient was admitted for severe lower extremity pain that prevented ambulation and was found to have a large disc herniation with nerve root compression. She was taken to the OR for an uncomplicated MIS L5-S1 microdiscectomy. She was recovered in PACU and transferred to [facility]. Her hospital course was uncomplicated and the patient was discharged home on 07/16/2018. She will follow up in clinic with me in 2 weeks.  Neurologic exam at discharge:  AOx3, PERRL, EOMI, FS, TM Strength 5/5 x4, SILTx4  Discharge diagnosis: Lumbar radiculopathy  Jadene Pierini, MD 07/16/18 3:33 PM

## 2018-07-16 NOTE — Evaluation (Signed)
Physical Therapy Evaluation Patient Details Name: Megan AmesRebecca [name] MRN: [id number] DOB: 1980/07/03 Today's Date: 07/16/2018   History of Present Illness  Patient is a 38 y/o female who presents s/p L5-S1 disectomy. PMH includes PTSD. obesity, Hep C, cirrhosis, dyslipidemia.  Clinical Impression  Patient presents with pain and post surgical deficits s/p above surgery. Pt independent PTA but for the last 3 weeks has had difficulty with mobility. Reports 2 falls. Lives with roommate and sister lives next door. Also has support of parents who live 8 miles away. Tolerated bed mobility, transfers and gait training with Min guard for safety. Education re: back precautions, log roll technique, positioning etc. Will follow acutely to maximize independence and mobility prior to return home.     Follow Up Recommendations No PT follow up;Supervision - Intermittent    Equipment Recommendations  Rolling walker with 5" wheels    Recommendations for Other Services       Precautions / Restrictions Precautions Precautions: Fall;Back Precaution Booklet Issued: No Precaution Comments: reviewed back precautions Restrictions Weight Bearing Restrictions: No      Mobility  Bed Mobility Overal bed mobility: Needs Assistance Bed Mobility: Sit to Sidelying         Sit to sidelying: Modified independent (Device/Increase time);HOB elevated General bed mobility comments: Cues for log roll technique, use of rail.  Transfers Overall transfer level: Needs assistance Equipment used: Rolling walker (2 wheeled) Transfers: Sit to/from Stand Sit to Stand: Min guard         General transfer comment: Min guard for safety. Stood from chair x1, cues for hand placement.   Ambulation/Gait Ambulation/Gait assistance: Min guard Gait Distance (Feet): 120 Feet Assistive device: Rolling walker (2 wheeled) Gait Pattern/deviations: Step-through pattern;Decreased stride length Gait velocity: decreased   General  Gait Details: Slow, mildly unsteady gait with RW for support. Fatigues and reports weakness with increased distance. Cues for RW management.   Stairs            Wheelchair Mobility    Modified Rankin (Stroke Patients Only)       Balance Overall balance assessment: Needs assistance;History of Falls Sitting-balance support: Feet supported;No upper extremity supported Sitting balance-Leahy Scale: Good       Standing balance-Leahy Scale: Poor Standing balance comment: Requires UE support in standing.                              Pertinent Vitals/Pain Pain Assessment: Faces Faces Pain Scale: Hurts little more Pain Location: LLE Pain Descriptors / Indicators: Sore;Operative site guarding;Radiating Pain Intervention(s): Monitored during session;Repositioned    Home Living Family/patient expects to be discharged to:: Private residence Living Arrangements: (roommate works from 6-230 pm) Available Help at Discharge: Family;Available PRN/intermittently Type of Home: House Home Access: Stairs to enter Entrance Stairs-Rails: Left;Right;Can reach both Entrance Stairs-Number of Steps: 5 Home Layout: One level Home Equipment: Crutches;Bedside commode;Shower seat      Prior Function Level of Independence: Independent         Comments: Works in [employer] business. the last 3 weeks has not been able to move much due to pain. Reports 2 falls. Has groceries and meds delievered. Has sister next door and parents 8 miles away.      Hand Dominance        Extremity/Trunk Assessment   Upper Extremity Assessment Upper Extremity Assessment: Defer to OT evaluation    Lower Extremity Assessment Lower Extremity Assessment: Generalized weakness    Cervical /  Trunk Assessment Cervical / Trunk Assessment: Other exceptions Cervical / Trunk Exceptions: s/p spine surgery  Communication   Communication: No difficulties  Cognition Arousal/Alertness:  Awake/alert Behavior During Therapy: WFL for tasks assessed/performed Overall Cognitive Status: Within Functional Limits for tasks assessed                                        General Comments      Exercises     Assessment/Plan    PT Assessment Patient needs continued PT services  PT Problem List Decreased strength;Decreased balance;Decreased knowledge of precautions;Pain;Decreased knowledge of use of DME;Decreased mobility;Decreased activity tolerance;Impaired sensation       PT Treatment Interventions Functional mobility training;Balance training;Patient/family education;DME instruction;Gait training;Therapeutic activities;Stair training;Therapeutic exercise    PT Goals (Current goals can be found in the Care Plan section)  Acute Rehab PT Goals Patient Stated Goal: to get home tomorrow PT Goal Formulation: With patient Time For Goal Achievement: 07/30/18 Potential to Achieve Goals: Good    Frequency Min 5X/week   Barriers to discharge Inaccessible home environment;Decreased caregiver support stairs to enter home; home alone first part of day    Co-evaluation               AM-PAC PT "6 Clicks" Mobility  Outcome Measure Help needed turning from your back to your side while in a flat bed without using bedrails?: A Little Help needed moving from lying on your back to sitting on the side of a flat bed without using bedrails?: A Little Help needed moving to and from a bed to a chair (including a wheelchair)?: None Help needed standing up from a chair using your arms (e.g., wheelchair or bedside chair)?: None Help needed to walk in hospital room?: A Little Help needed climbing 3-5 steps with a railing? : A Little 6 Click Score: 20    End of Session Equipment Utilized During Treatment: Gait belt Activity Tolerance: Patient tolerated treatment well Patient left: in bed;with call bell/phone within reach;with bed alarm set Nurse Communication:  Mobility status PT Visit Diagnosis: Pain;Muscle weakness (generalized) (M62.81);Unsteadiness on feet (R26.81) Pain - Right/Left: Left Pain - part of body: Leg    Time: 3149-7026 PT Time Calculation (min) (ACUTE ONLY): 21 min   Charges:   PT Evaluation $PT Eval Low Complexity: 1 Low          Mylo Red, PT, DPT Acute Rehabilitation Services Pager [phone] Office [phone]      Blake Divine A Lanier Ensign 07/16/2018, 11:57 AM

## 2018-09-03 ENCOUNTER — Other Ambulatory Visit: Payer: Self-pay | Admitting: Nurse Practitioner

## 2018-09-03 DIAGNOSIS — K7469 Other cirrhosis of liver: Secondary | ICD-10-CM

## 2018-09-11 ENCOUNTER — Ambulatory Visit
Admission: RE | Admit: 2018-09-11 | Discharge: 2018-09-11 | Disposition: A | Discharge status: Home / Self Care | Payer: Medicaid Other | Source: Ambulatory Visit | Attending: Nurse Practitioner | Admitting: Nurse Practitioner

## 2018-09-11 DIAGNOSIS — K7469 Other cirrhosis of liver: Secondary | ICD-10-CM

## 2018-09-11 IMAGING — US ULTRASOUND ABDOMEN LIMITED
1 series · 14 of 25 positions shown · non-contrast
Comparison: [DATE]

CLINICAL DATA: History of cirrhosis.

EXAM:
ULTRASOUND ABDOMEN LIMITED RIGHT UPPER QUADRANT

[Series 1: ultrasound abdomen limited · 0.30mm/px · 14 of 40 slices shown]
[im 1/40]
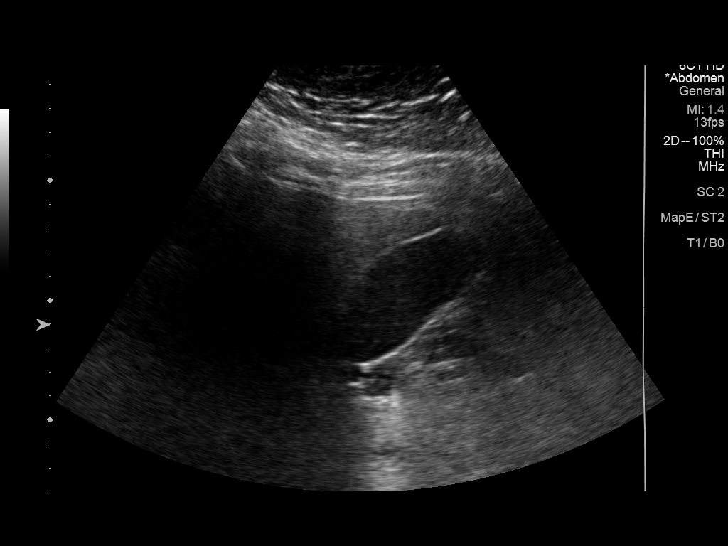
[im 4/40]
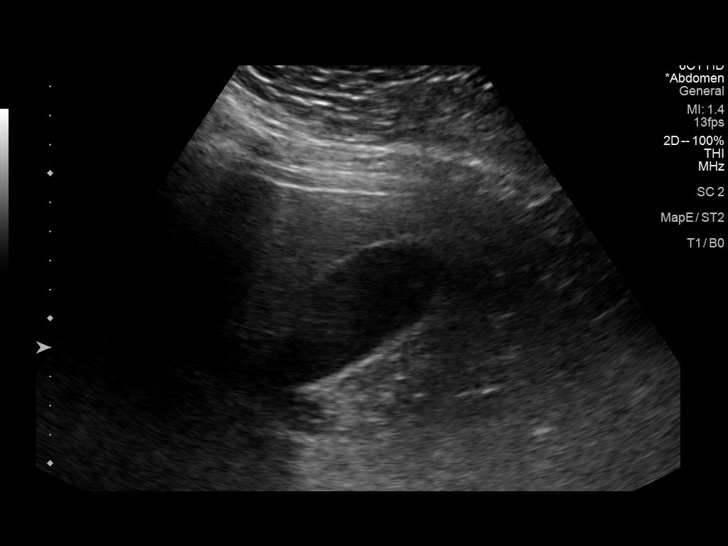
[im 7/40]
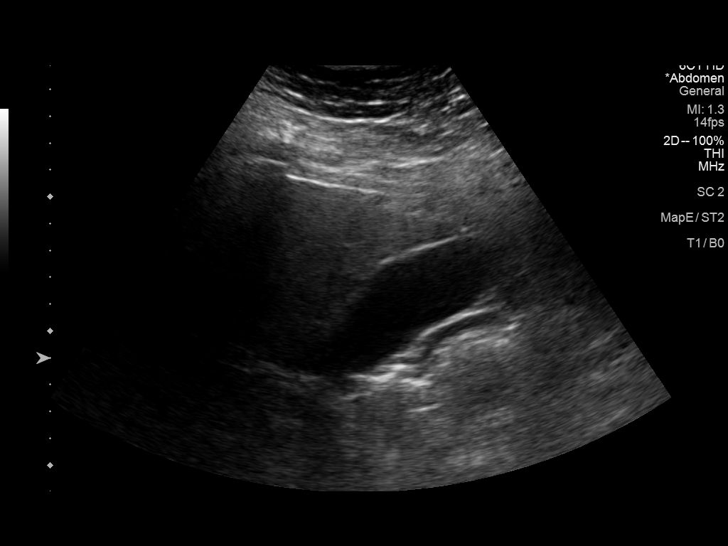
[im 10/40]
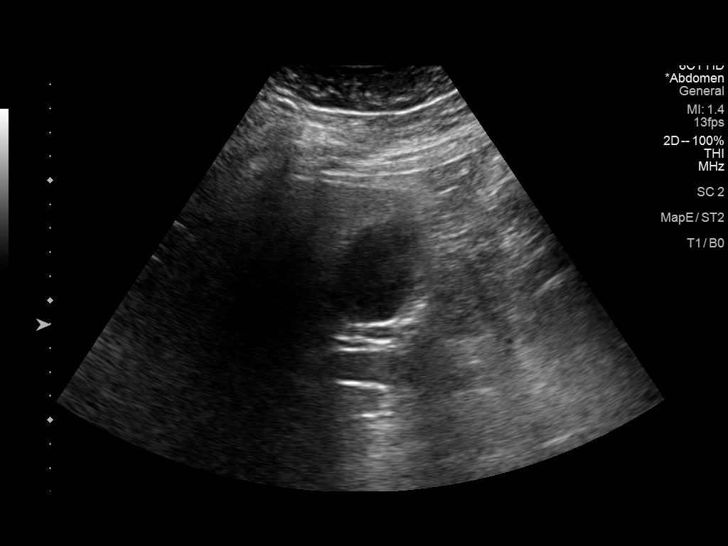
[im 14/40]
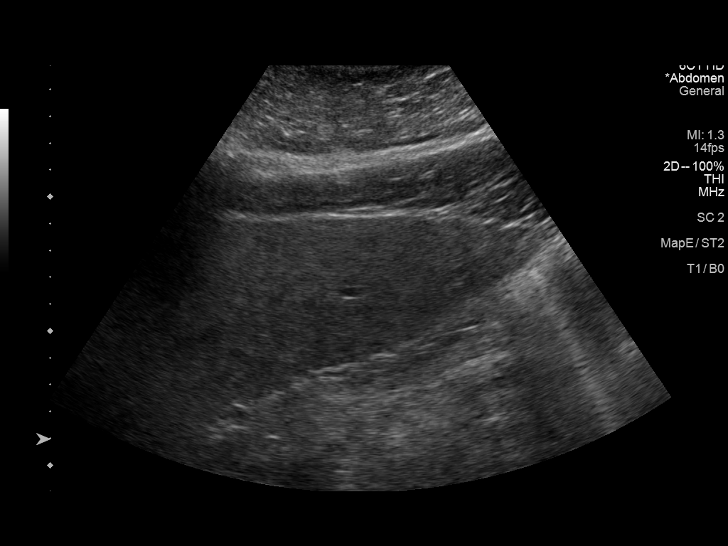
[im 15/40]
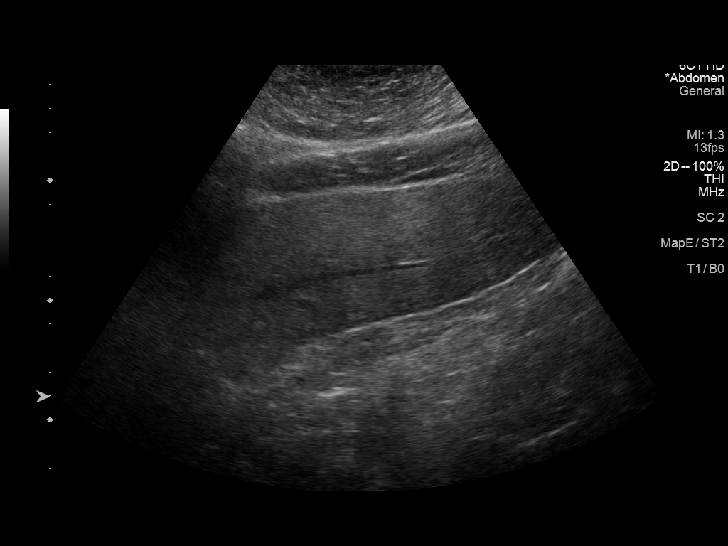
[im 18/40]
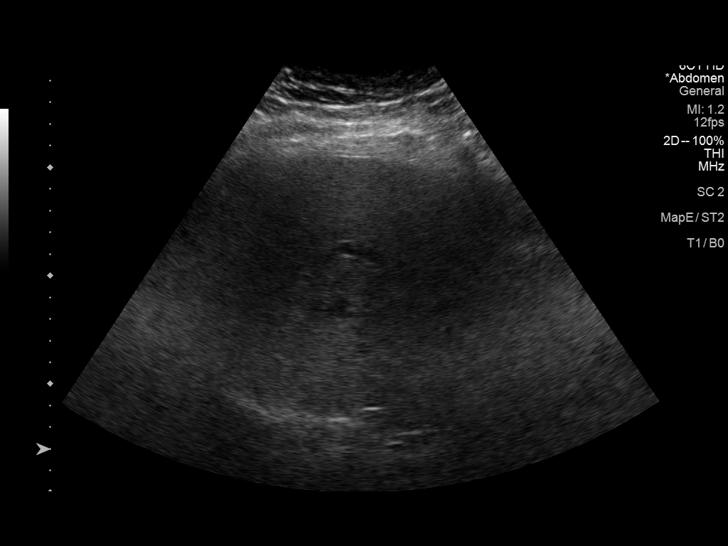
[im 22/40]
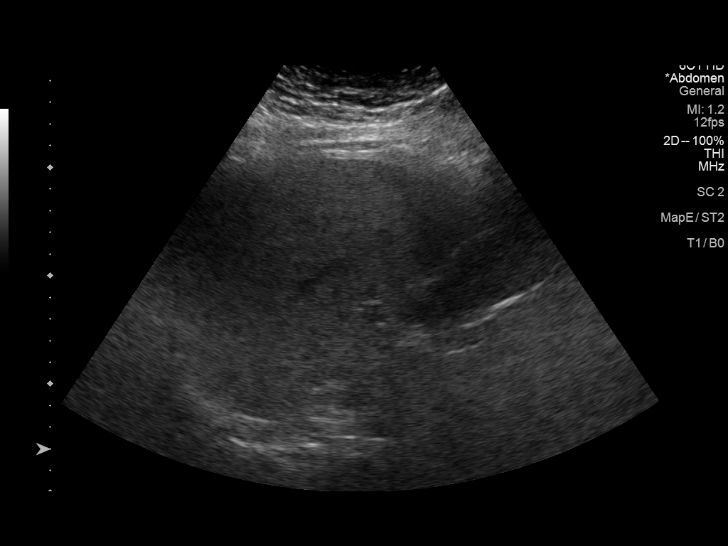
[im 25/40]
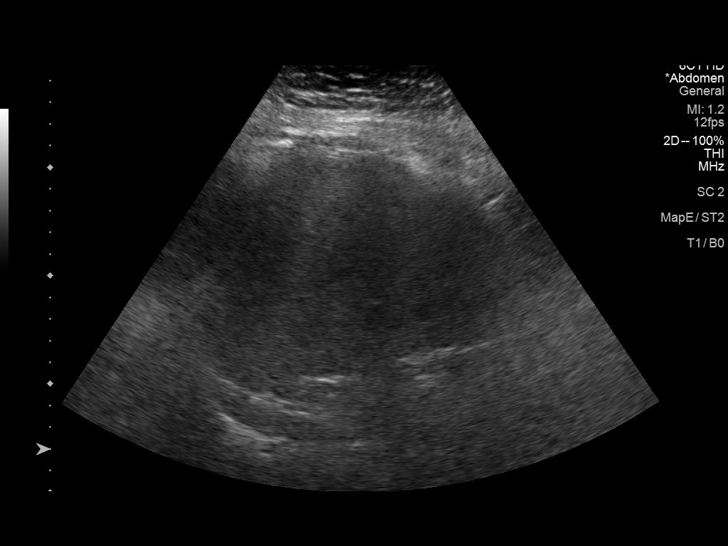
[im 27/40]
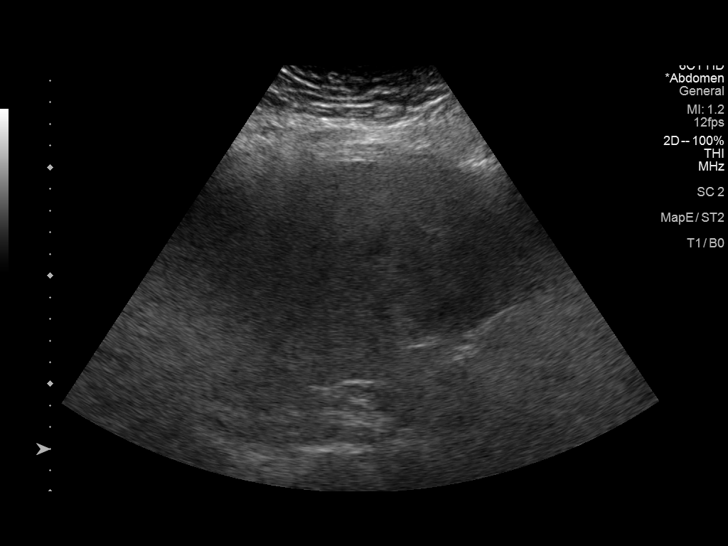
[im 30/40]
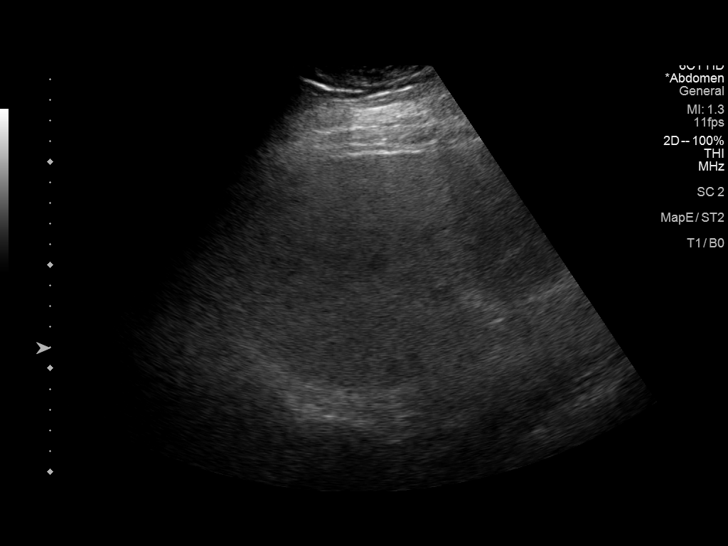
[im 33/40]
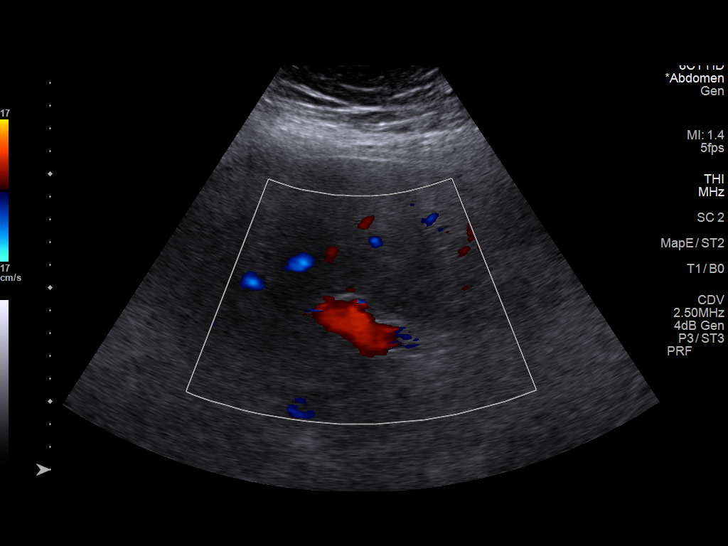
[im 36/40]
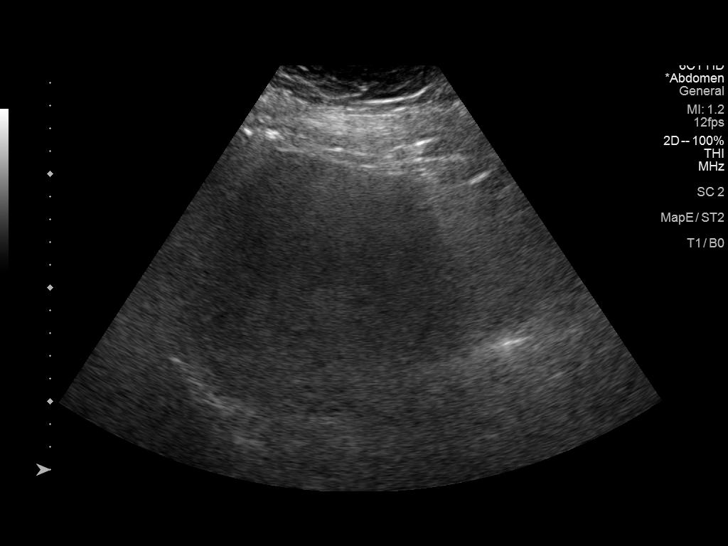
[im 40/40]
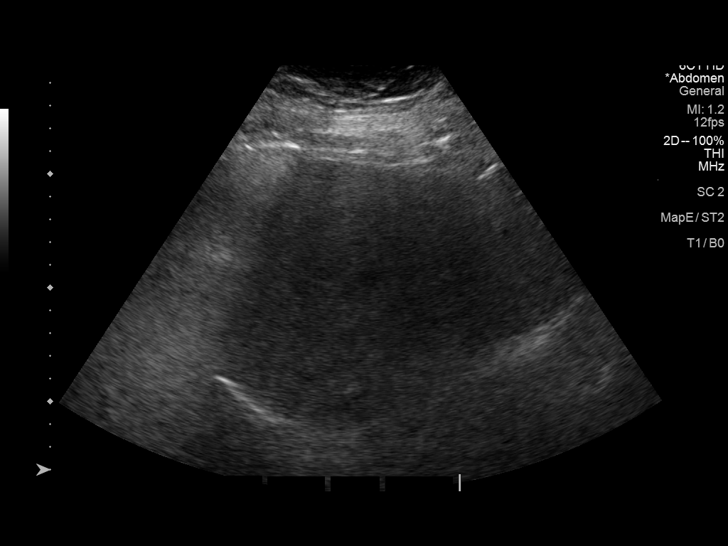

[14 of 25 positions shown; findings below may reference images not displayed]

FINDINGS: Gallbladder:

No gallstones or wall thickening visualized. No sonographic Murphy
sign noted by sonographer.

Common bile duct:

Diameter: 4.8 mm

Liver:

Coarsened echogenic echotexture. No focal mass. Portal vein is
patent on color Doppler imaging with normal direction of blood flow
towards the liver.
IMPRESSION: 1. The liver demonstrates coarsened echogenic echotexture with no
focal mass. This is unchanged since [DATE]. No other changes.

## 2018-09-13 ENCOUNTER — Other Ambulatory Visit: Payer: Self-pay | Admitting: Neurological Surgery

## 2018-09-13 DIAGNOSIS — M7070 Other bursitis of hip, unspecified hip: Secondary | ICD-10-CM

## 2018-09-21 HISTORY — PX: COLON SURGERY: SHX602

## 2018-09-23 ENCOUNTER — Ambulatory Visit
Admission: RE | Admit: 2018-09-23 | Discharge: 2018-09-23 | Disposition: A | Discharge status: Home / Self Care | Payer: Medicaid Other | Source: Ambulatory Visit | Attending: Neurological Surgery | Admitting: Neurological Surgery

## 2018-09-23 ENCOUNTER — Other Ambulatory Visit: Payer: Self-pay

## 2018-09-23 DIAGNOSIS — M7070 Other bursitis of hip, unspecified hip: Secondary | ICD-10-CM

## 2018-09-23 IMAGING — XA FLUORO GUIDED NEEDLE PLACEMENT AND/OR ASPIRATION
1 series · 1 of 1 positions shown · non-contrast
Comparison: none

CLINICAL DATA: Left hip pain.

[Series 1: ortho standard · 1 of 1 slices shown]
[im 1/1]
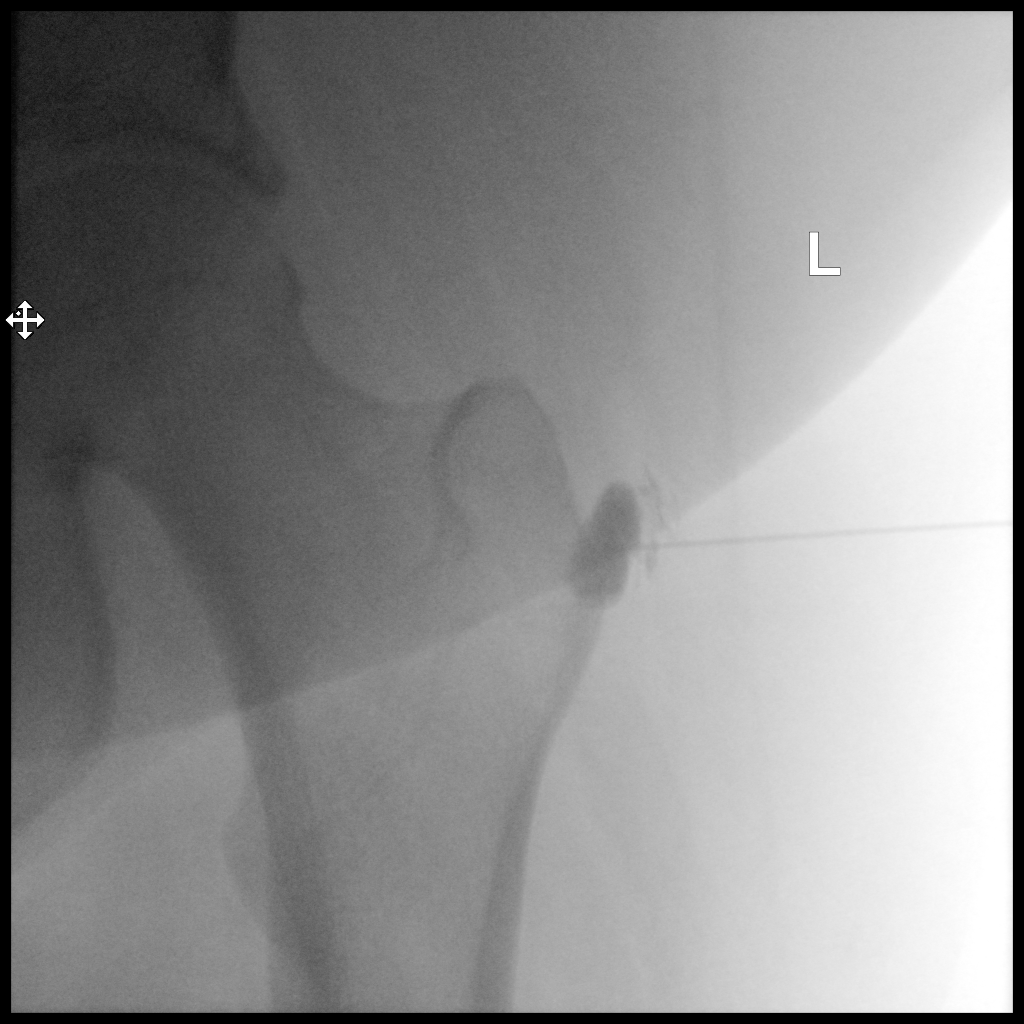

[1 of 1 positions shown; findings below may reference images not displayed]

FLUOROSCOPY TIME:  Radiation Exposure Index (as provided by the
fluoroscopic device): 56.89 uGy*m2

PROCEDURE:
Following informed, written consent, the patient was positioned
supine and the left hip trochanteric bursa was located under
fluoroscopy. The skin was prepped and draped in the usual sterile
fashion. The superficial soft tissues were anesthetized with 1%
lidocaine. A 22 gauge needle was advanced into the joint. Placement
was confirmed with 1.5 ml of Isovue M 200.

I then injected 120 mg Depo-Medrol and 1.5 mL 1% lidocaine. The
patient tolerated the procedure without immediate complication.
IMPRESSION: Technically successful left hip trochanteric bursa steroid
injection.

## 2018-09-23 MED ORDER — IOPAMIDOL (ISOVUE-M 200) INJECTION 41%
101.0000 mL | Freq: Once | INTRAMUSCULAR | Status: AC
  Administered 2018-09-23: 101 mL

## 2018-09-23 MED ORDER — METHYLPREDNISOLONE ACETATE 40 MG/ML INJ SUSP (RADIOLOG
120.0000 mg | Freq: Once | INTRAMUSCULAR | Status: AC
  Administered 2018-09-23: 120 mg via INTRALESIONAL

## 2018-10-14 DIAGNOSIS — K5792 Diverticulitis of intestine, part unspecified, without perforation or abscess without bleeding: Secondary | ICD-10-CM

## 2018-10-14 HISTORY — DX: Diverticulitis of intestine, part unspecified, without perforation or abscess without bleeding: K57.92

## 2018-10-15 MED ORDER — LITHIUM CARBONATE ER 300 MG PO TBCR
600.00 | EXTENDED_RELEASE_TABLET | ORAL | Status: DC
Start: ? — End: 2018-10-15

## 2018-10-15 MED ORDER — DIPHENHYDRAMINE HCL 50 MG/ML IJ SOLN
25.00 | INTRAMUSCULAR | Status: DC
Start: 2018-10-13 — End: 2018-10-15

## 2018-10-15 MED ORDER — TIMOLOL MALEATE 0.5 % OP SOLN
1.00 | OPHTHALMIC | Status: DC
Start: 2018-10-13 — End: 2018-10-15

## 2018-10-15 MED ORDER — LOSARTAN POTASSIUM (ANGIOTENSIN II RECEPTOR ANTAGONISTS)
20.00 | Status: DC
Start: 2018-10-13 — End: 2018-10-15

## 2018-10-15 MED ORDER — MASTUSSIN DM (ALCOHOL FREE) 100-10 MG/5ML OR SYRP
300.00 | ORAL_SOLUTION | ORAL | Status: DC
Start: 2018-10-13 — End: 2018-10-15

## 2018-10-15 MED ORDER — LAXATIVE PLUS STOOL SOFTENER 30-100 MG PO CAPS
10.00 | ORAL_CAPSULE | ORAL | Status: DC
Start: 2018-10-13 — End: 2018-10-15

## 2018-10-15 MED ORDER — APAP PO
40.00 | ORAL | Status: DC
Start: 2018-10-14 — End: 2018-10-15

## 2018-10-15 MED ORDER — BARO-CAT PO
125.00 | ORAL | Status: DC
Start: ? — End: 2018-10-15

## 2018-10-16 MED ORDER — METRISET BURETTE SET MISC
125.00 | Status: DC
Start: ? — End: 2018-10-16

## 2018-10-16 MED ORDER — Medication
1000.00 | Status: DC
Start: ? — End: 2018-10-16

## 2018-10-16 MED ORDER — LOVENOX 150 MG/ML ~~LOC~~ SOLN
1.00 | SUBCUTANEOUS | Status: DC
Start: ? — End: 2018-10-16

## 2018-10-16 MED ORDER — FP ANTI-DIARRHEAL 1 MG/5ML PO LIQD
500.00 | ORAL | Status: DC
Start: 2018-10-16 — End: 2018-10-16

## 2018-10-16 MED ORDER — PHENYLEPHRINE-GUAIFENESIN 20-375 MG PO CP12
10.00 | ORAL_CAPSULE | ORAL | Status: DC
Start: ? — End: 2018-10-16

## 2018-10-16 MED ORDER — Medication
1.00 | Status: DC
Start: ? — End: 2018-10-16

## 2018-10-16 MED ORDER — Medication
2.00 | Status: DC
Start: 2018-10-16 — End: 2018-10-16

## 2018-10-16 MED ORDER — ASTELIN 137 MCG/SPRAY NA SOLN
20.00 | NASAL | Status: DC
Start: 2018-10-16 — End: 2018-10-16

## 2018-10-16 MED ORDER — Medication
Status: DC
Start: ? — End: 2018-10-16

## 2018-10-16 MED ORDER — CVS EAR DROPS OT
40.00 | OTIC | Status: DC
Start: 2018-10-16 — End: 2018-10-16

## 2018-10-16 MED ORDER — CHLOROPHYLL EX
10.00 | CUTANEOUS | Status: DC
Start: ? — End: 2018-10-16

## 2018-10-16 MED ORDER — SUPER B-50 B COMPLEX PO
0.40 | ORAL | Status: DC
Start: ? — End: 2018-10-16

## 2018-10-16 MED ORDER — COPPERTONE SPF8 EX
10.00 | CUTANEOUS | Status: DC
Start: ? — End: 2018-10-16

## 2018-10-16 MED ORDER — Medication
12.50 | Status: DC
Start: ? — End: 2018-10-16

## 2018-12-23 DIAGNOSIS — F419 Anxiety disorder, unspecified: Secondary | ICD-10-CM | POA: Insufficient documentation

## 2018-12-23 DIAGNOSIS — F319 Bipolar disorder, unspecified: Secondary | ICD-10-CM

## 2018-12-23 HISTORY — DX: Bipolar disorder, unspecified: F31.9

## 2018-12-23 HISTORY — DX: Anxiety disorder, unspecified: F41.9

## 2019-05-02 DIAGNOSIS — K219 Gastro-esophageal reflux disease without esophagitis: Secondary | ICD-10-CM

## 2019-05-02 DIAGNOSIS — M5416 Radiculopathy, lumbar region: Secondary | ICD-10-CM

## 2019-05-02 DIAGNOSIS — B182 Chronic viral hepatitis C: Secondary | ICD-10-CM | POA: Insufficient documentation

## 2019-05-02 DIAGNOSIS — I10 Essential (primary) hypertension: Secondary | ICD-10-CM

## 2019-05-02 DIAGNOSIS — E785 Hyperlipidemia, unspecified: Secondary | ICD-10-CM | POA: Insufficient documentation

## 2019-05-02 DIAGNOSIS — F4310 Post-traumatic stress disorder, unspecified: Secondary | ICD-10-CM | POA: Insufficient documentation

## 2019-05-02 DIAGNOSIS — R7881 Bacteremia: Secondary | ICD-10-CM

## 2019-05-02 DIAGNOSIS — Z6841 Body Mass Index (BMI) 40.0 and over, adult: Secondary | ICD-10-CM | POA: Insufficient documentation

## 2019-05-02 DIAGNOSIS — E282 Polycystic ovarian syndrome: Secondary | ICD-10-CM | POA: Insufficient documentation

## 2019-05-02 HISTORY — DX: Gastro-esophageal reflux disease without esophagitis: K21.9

## 2019-05-02 HISTORY — DX: Radiculopathy, lumbar region: M54.16

## 2019-05-02 HISTORY — DX: Bacteremia: R78.81

## 2019-05-02 HISTORY — DX: Body Mass Index (BMI) 40.0 and over, adult: Z684

## 2019-05-02 HISTORY — DX: Chronic viral hepatitis C: B18.2

## 2019-05-02 HISTORY — DX: Essential (primary) hypertension: I10

## 2019-05-03 DIAGNOSIS — B9629 Other Escherichia coli [E. coli] as the cause of diseases classified elsewhere: Secondary | ICD-10-CM | POA: Insufficient documentation

## 2019-05-03 DIAGNOSIS — N390 Urinary tract infection, site not specified: Secondary | ICD-10-CM

## 2019-05-03 HISTORY — DX: Urinary tract infection, site not specified: N39.0

## 2019-05-13 ENCOUNTER — Other Ambulatory Visit: Payer: Self-pay | Admitting: Physician Assistant

## 2019-05-13 DIAGNOSIS — Z1231 Encounter for screening mammogram for malignant neoplasm of breast: Secondary | ICD-10-CM

## 2019-05-29 ENCOUNTER — Ambulatory Visit
Admission: RE | Admit: 2019-05-29 | Discharge: 2019-05-29 | Disposition: A | Discharge status: Home / Self Care | Payer: Medicaid Other | Source: Ambulatory Visit | Attending: Physician Assistant | Admitting: Physician Assistant

## 2019-05-29 ENCOUNTER — Other Ambulatory Visit: Payer: Self-pay

## 2019-05-29 DIAGNOSIS — Z1231 Encounter for screening mammogram for malignant neoplasm of breast: Secondary | ICD-10-CM

## 2019-05-29 IMAGING — MG DIGITAL SCREENING BILAT W/ TOMO W/ CAD
6 of 10 series · 6 of 30 positions shown · non-contrast
Comparison: None.

CLINICAL DATA: Screening.

EXAM:
DIGITAL SCREENING BILATERAL MAMMOGRAM WITH TOMO AND CAD

[L CC synth-2D (1 of 2)]
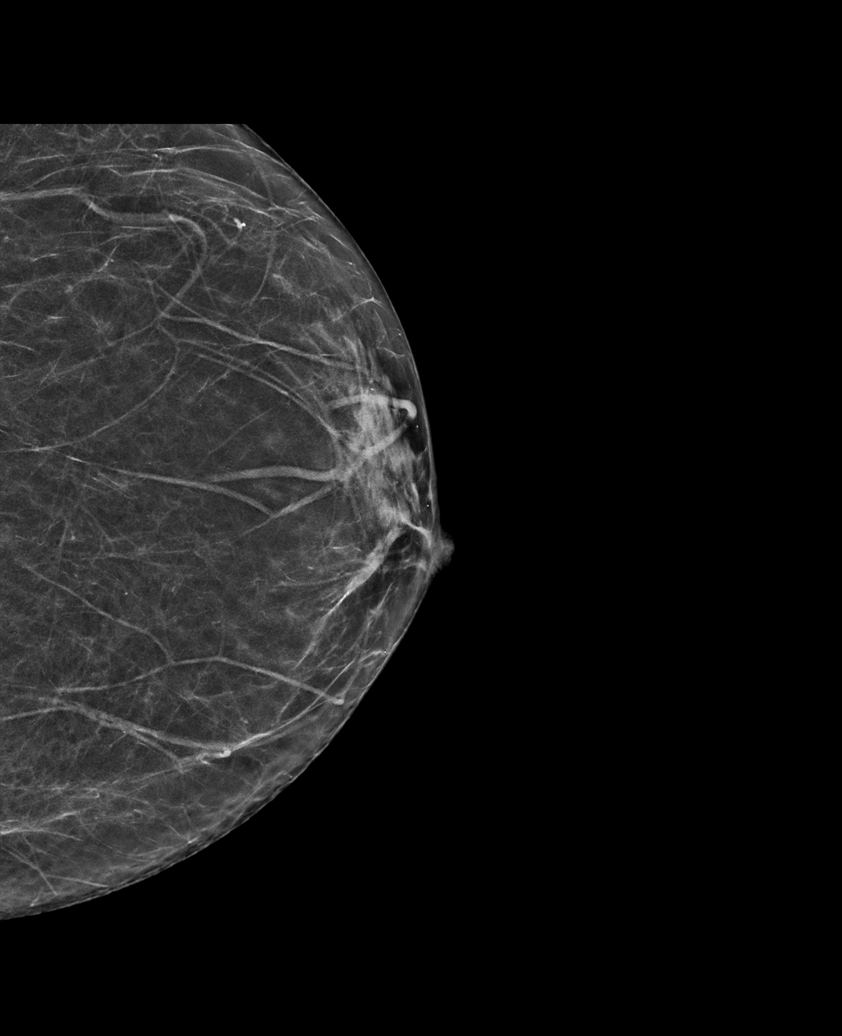

[L MLO synth-2D]
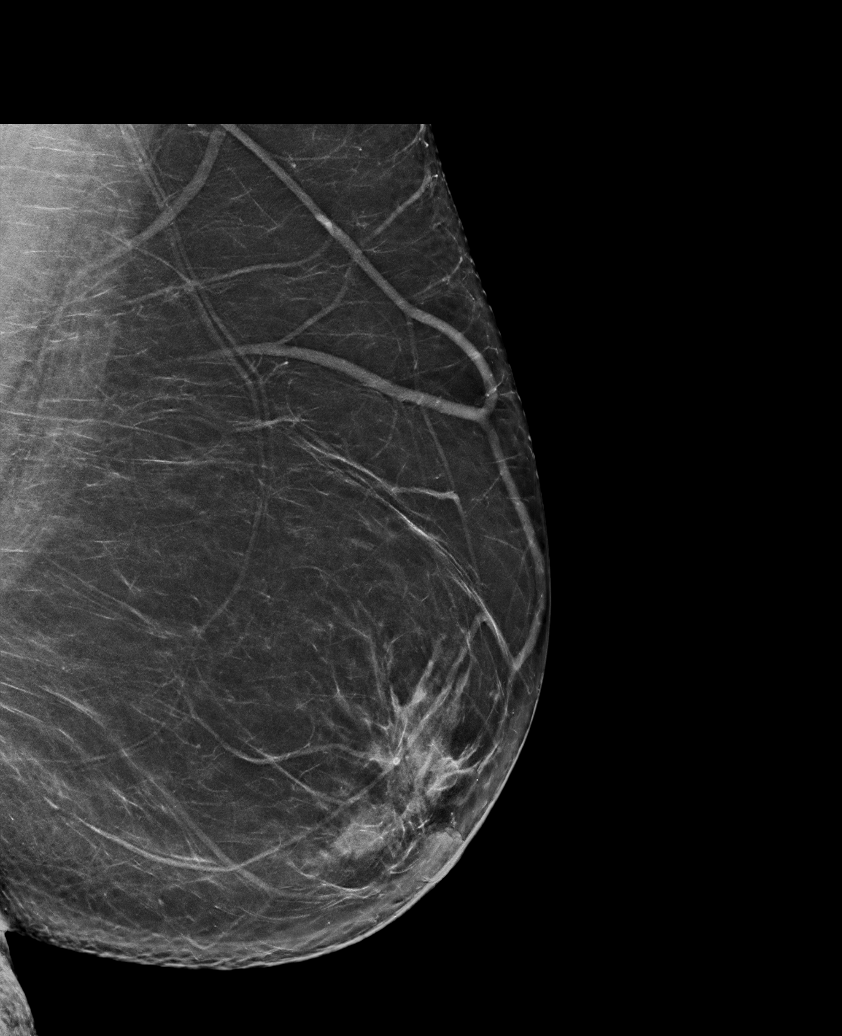

[L CC synth-2D (2 of 2)]
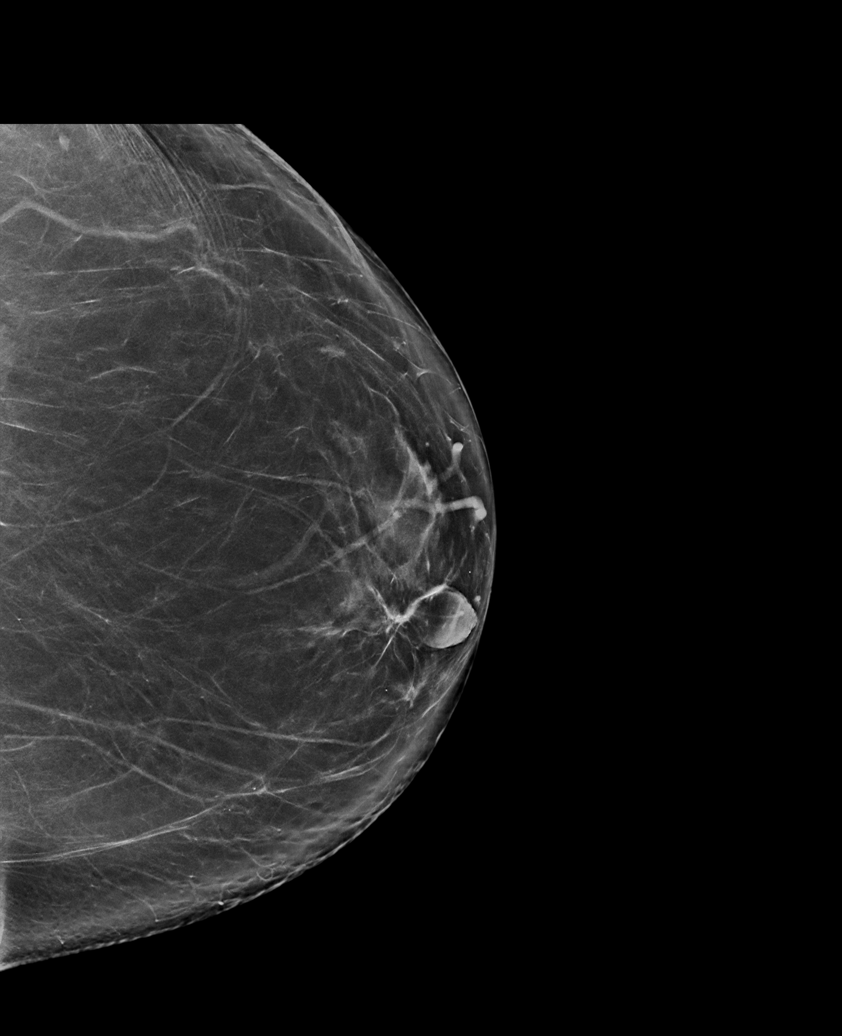

[R CC synth-2D]
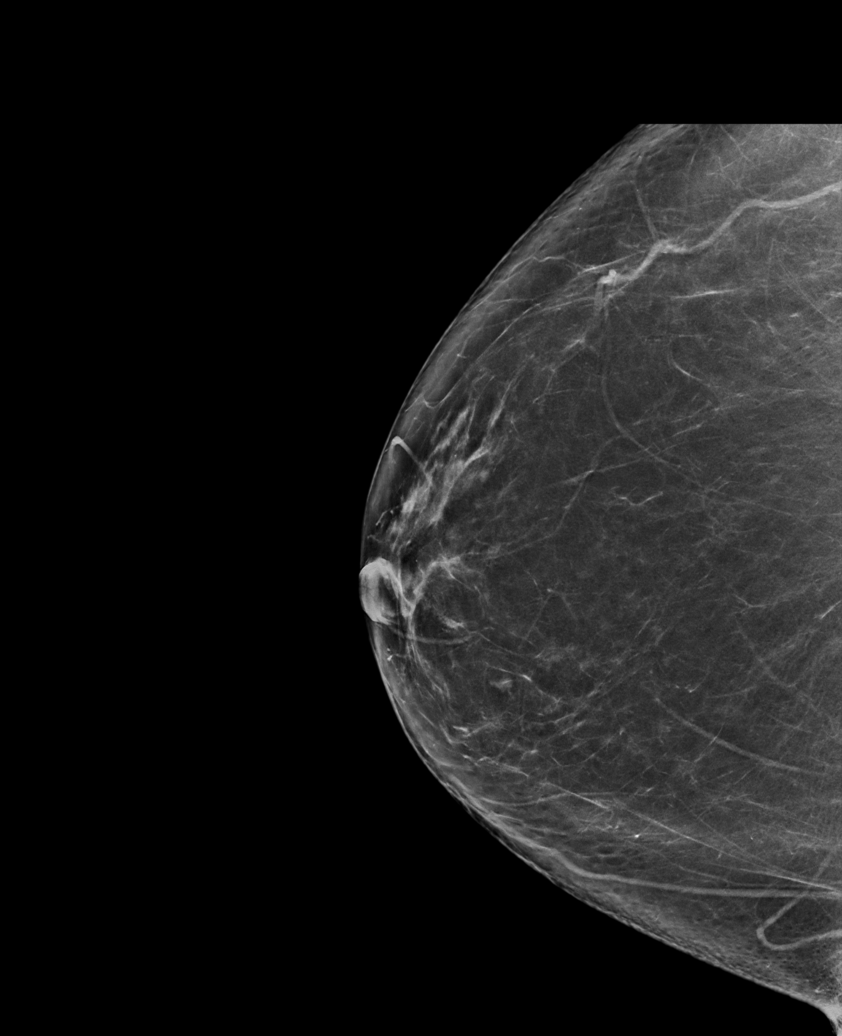

[R MLO synth-2D]
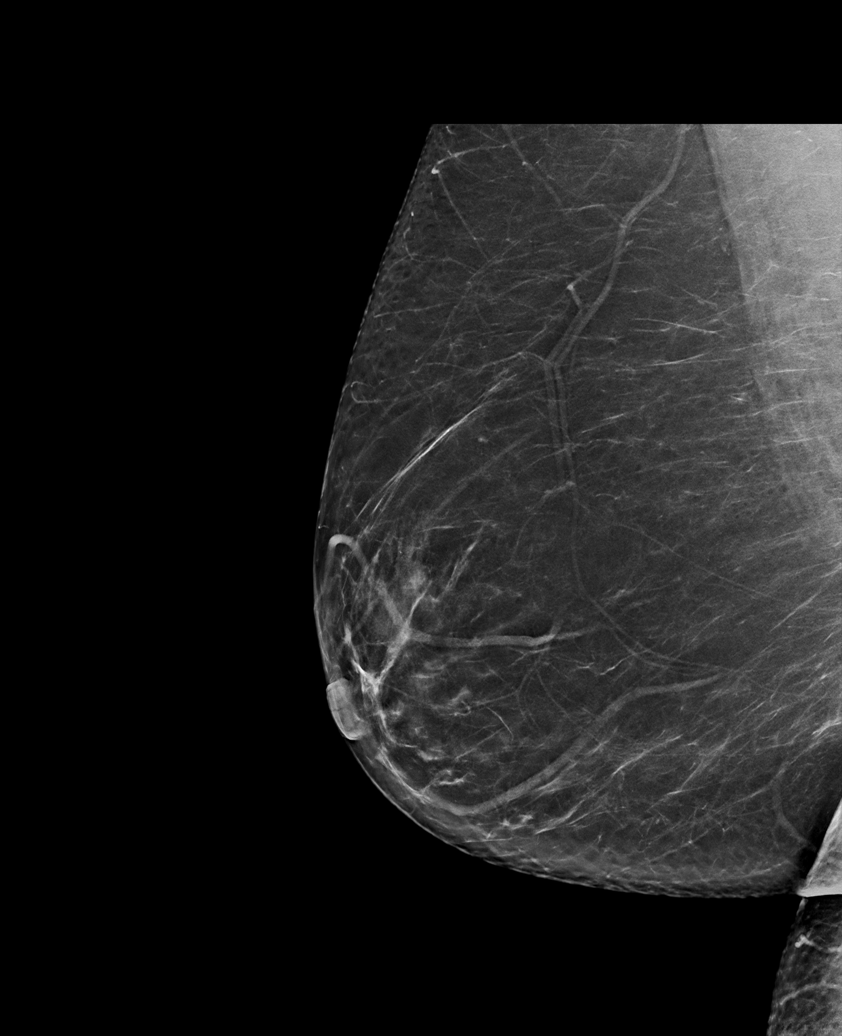

[R MLO tomo · tomo slice 41/81.0]
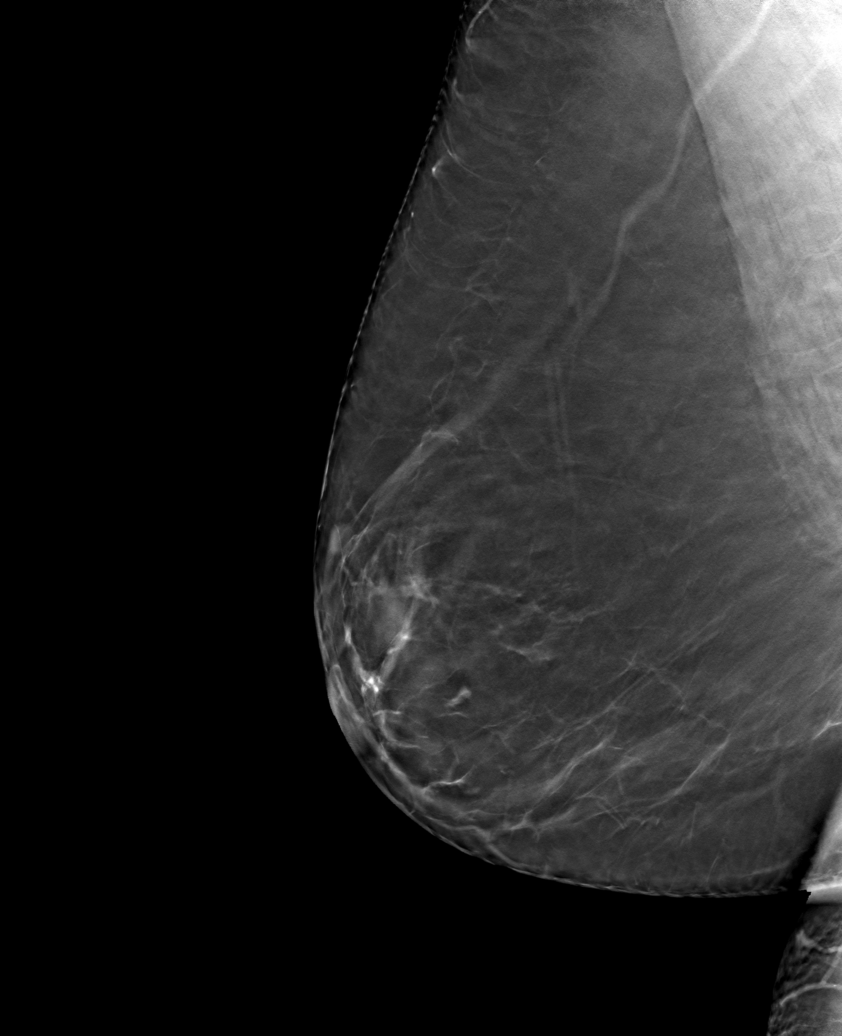

[6 of 30 positions shown; findings below may reference images not displayed]

ACR Breast Density Category b: There are scattered areas of
fibroglandular density.
FINDINGS: There are no findings suspicious for malignancy. Images were
processed with CAD.
IMPRESSION: No mammographic evidence of malignancy. A result letter of this
screening mammogram will be mailed directly to the patient.

RECOMMENDATION:
Screening mammogram in one year. (Code:[1T])

BI-RADS CATEGORY  1: Negative.

## 2019-05-30 ENCOUNTER — Ambulatory Visit: Payer: Medicaid Other

## 2019-06-03 ENCOUNTER — Other Ambulatory Visit: Payer: Self-pay | Admitting: Nurse Practitioner

## 2019-06-03 DIAGNOSIS — K7469 Other cirrhosis of liver: Secondary | ICD-10-CM

## 2019-06-11 ENCOUNTER — Ambulatory Visit
Admission: RE | Admit: 2019-06-11 | Discharge: 2019-06-11 | Disposition: A | Discharge status: Home / Self Care | Payer: Medicaid Other | Source: Ambulatory Visit | Attending: Nurse Practitioner | Admitting: Nurse Practitioner

## 2019-06-11 DIAGNOSIS — K7469 Other cirrhosis of liver: Secondary | ICD-10-CM

## 2019-06-11 IMAGING — US US ABDOMEN LIMITED
1 series · 14 of 25 positions shown · non-contrast
Comparison: [DATE].

CLINICAL DATA: Hepatic cirrhosis.

EXAM:
ULTRASOUND ABDOMEN LIMITED RIGHT UPPER QUADRANT

[Series 1: us abdomen limited · 0.18mm/px · 14 of 37 slices shown]
[im 1/37]
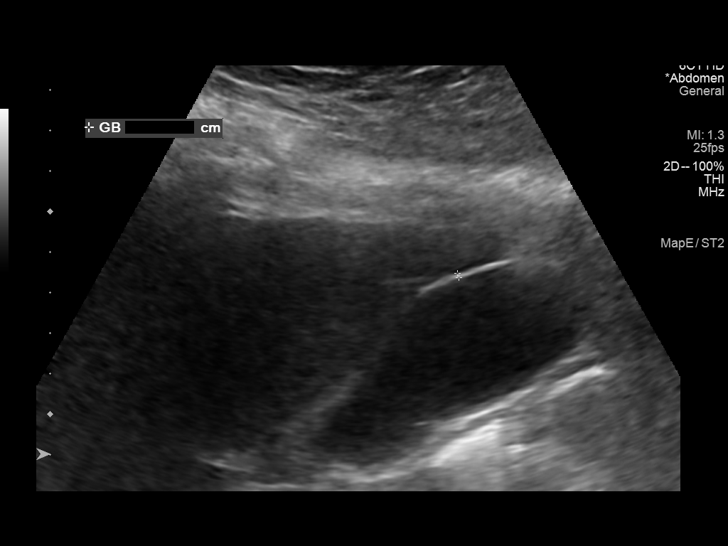
[im 4/37]
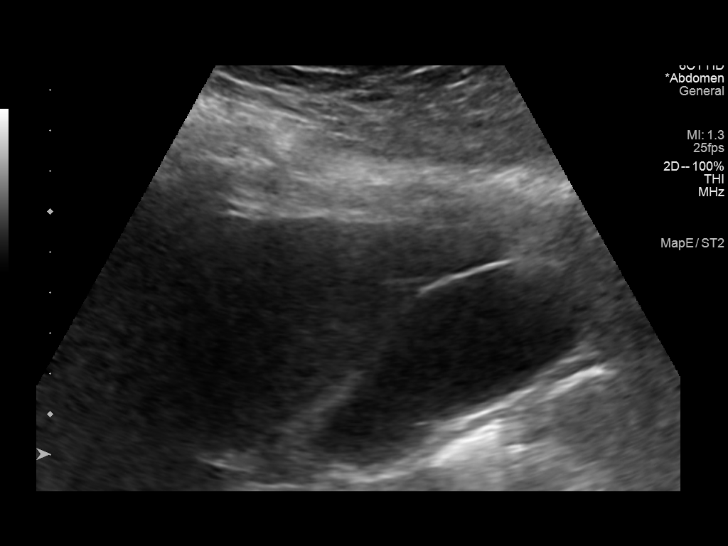
[im 7/37]
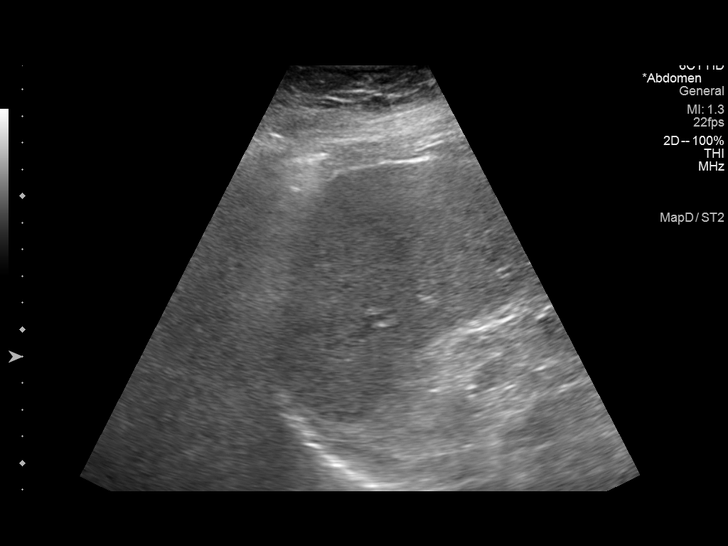
[im 10/37]
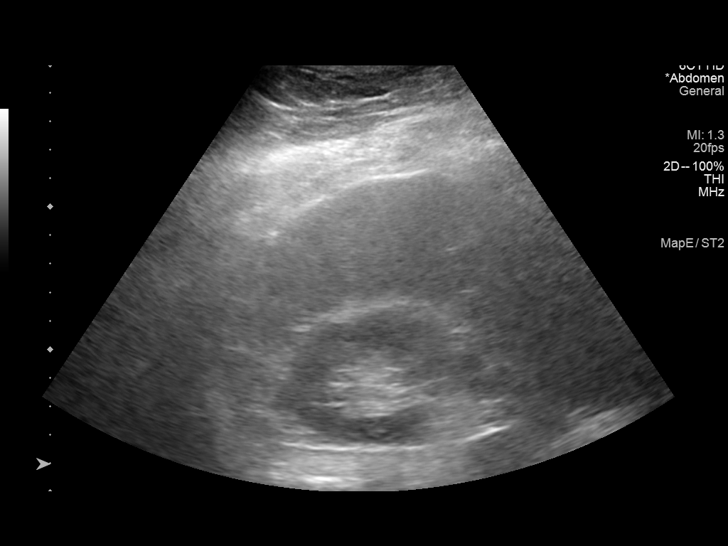
[im 13/37]
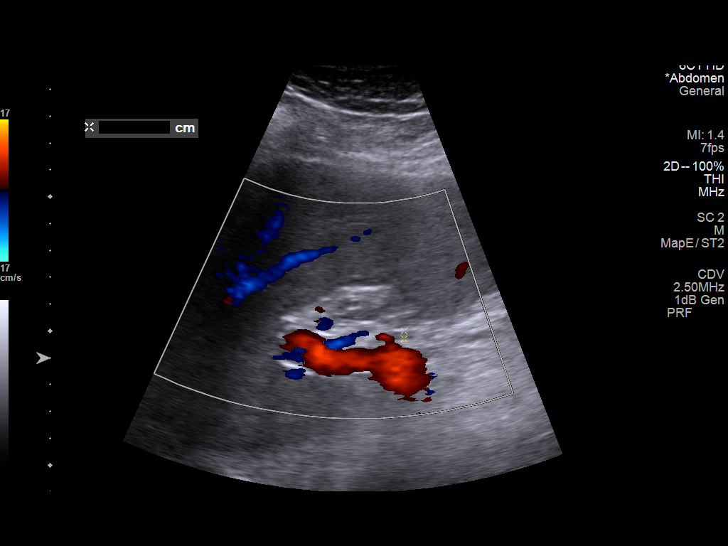
[im 14/37]
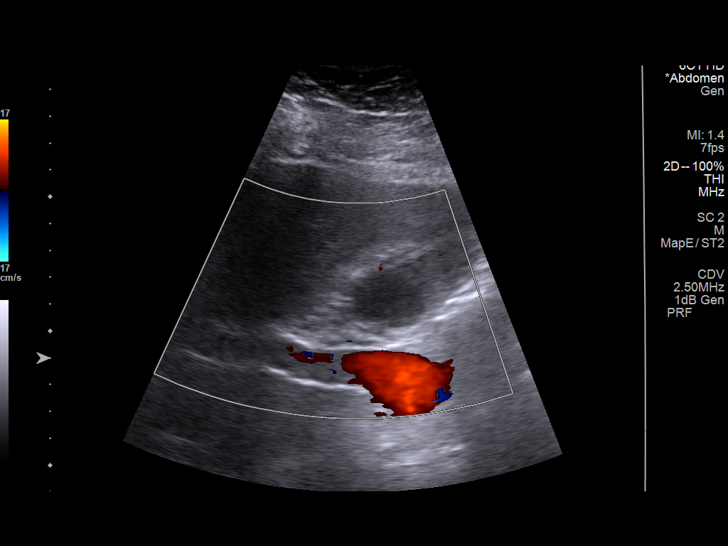
[im 17/37]
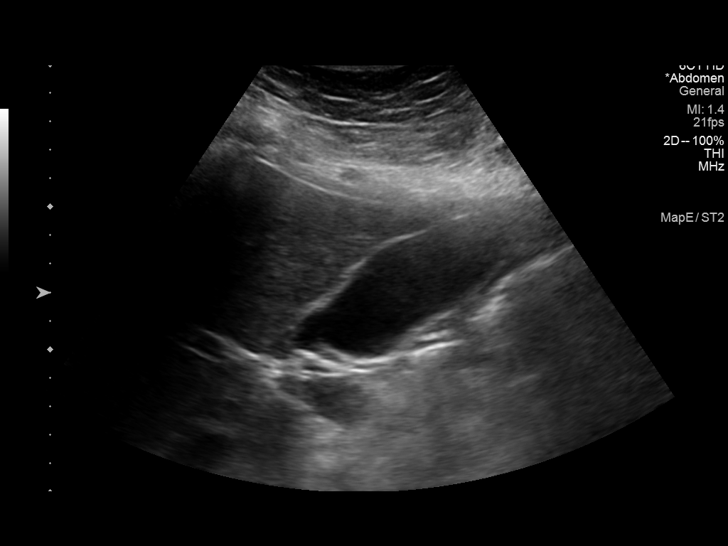
[im 20/37]
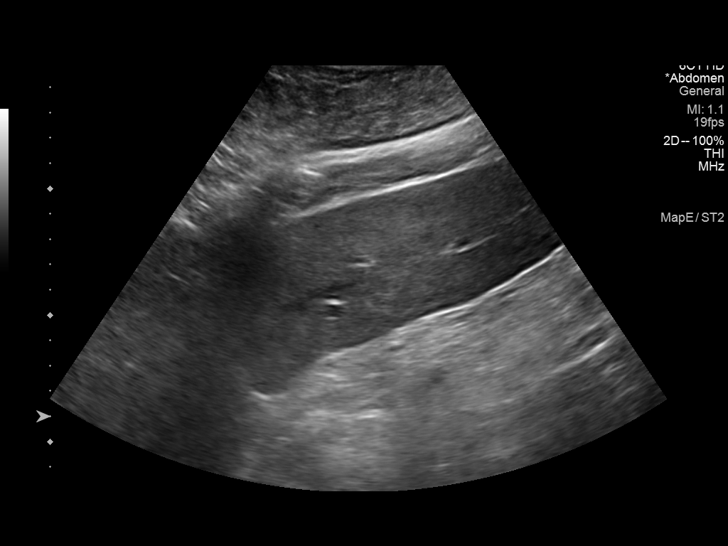
[im 23/37]
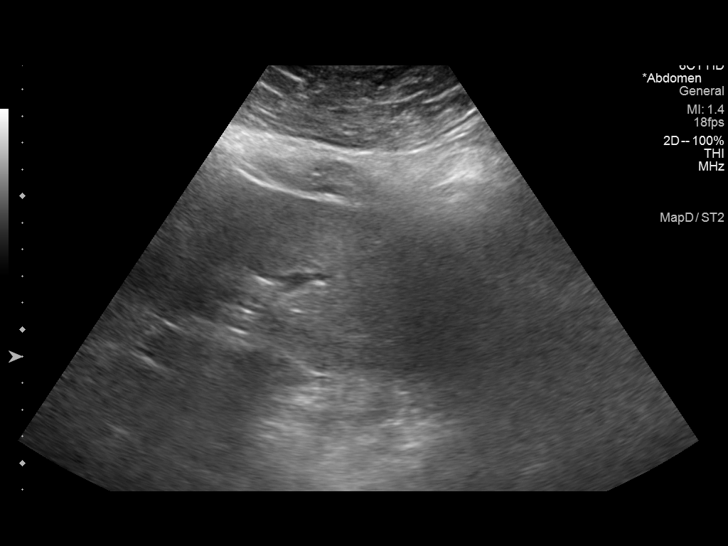
[im 25/37]
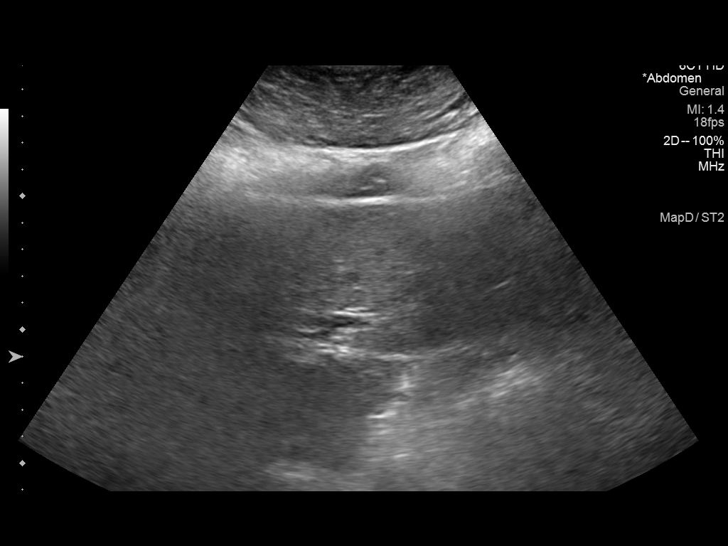
[im 28/37]
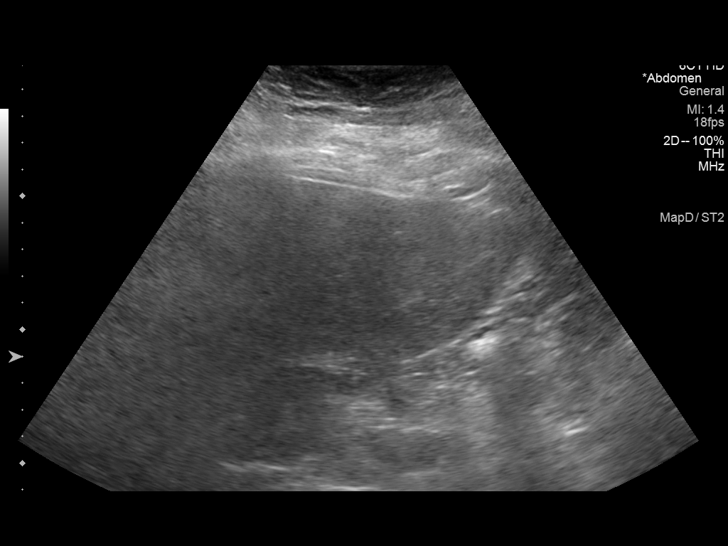
[im 31/37]
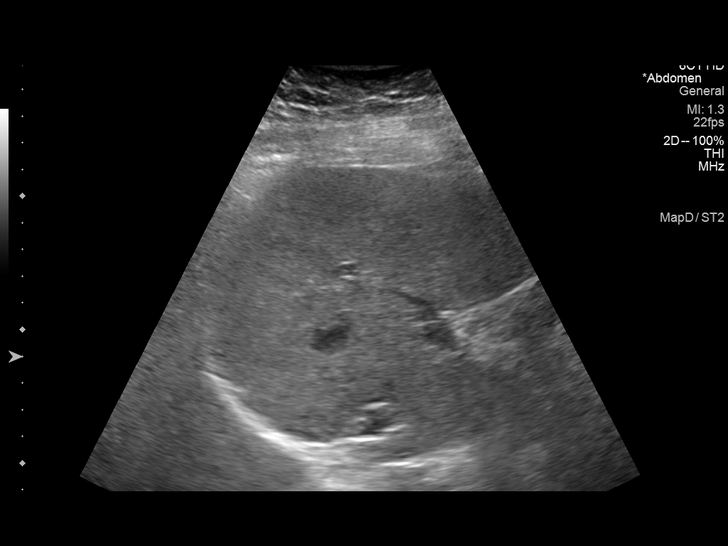
[im 34/37]
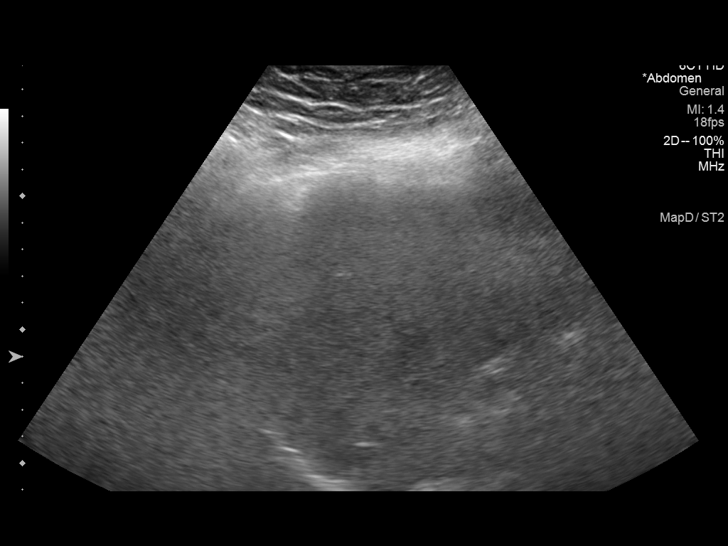
[im 37/37]
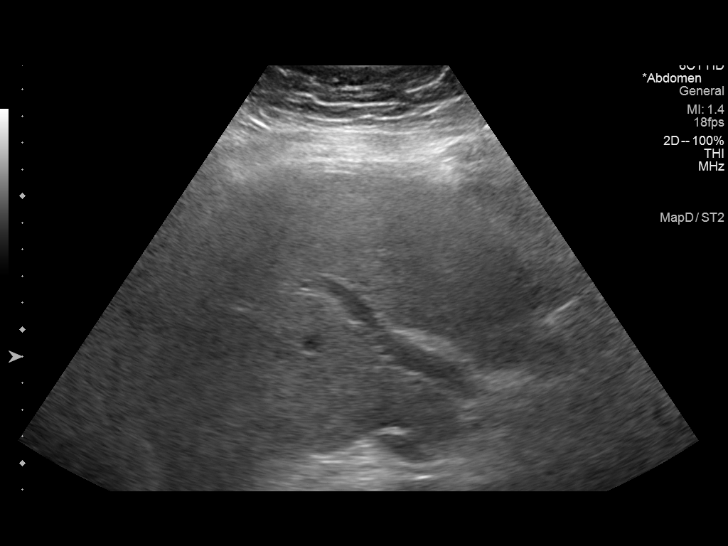

[14 of 25 positions shown; findings below may reference images not displayed]

FINDINGS: Gallbladder:

No gallstones or wall thickening visualized. No sonographic Murphy
sign noted by sonographer.

Common bile duct:

Diameter: 3 mm which is within normal limits.

Liver:

Coarse echotexture of hepatic parenchyma is noted. No definite focal
sonographic abnormality is noted. Portal vein is patent on color
Doppler imaging with normal direction of blood flow towards the
liver.

Other: None.
IMPRESSION: Coarse echotexture of hepatic parenchyma is noted concerning for
diffuse hepatocellular disease such as cirrhosis. No focal
sonographic hepatic abnormality is noted.

## 2019-09-21 HISTORY — PX: HERNIA REPAIR: SHX51

## 2019-09-29 DIAGNOSIS — K5732 Diverticulitis of large intestine without perforation or abscess without bleeding: Secondary | ICD-10-CM | POA: Insufficient documentation

## 2019-09-29 DIAGNOSIS — Z8619 Personal history of other infectious and parasitic diseases: Secondary | ICD-10-CM

## 2019-09-29 DIAGNOSIS — Z9889 Other specified postprocedural states: Secondary | ICD-10-CM | POA: Insufficient documentation

## 2019-09-29 DIAGNOSIS — K432 Incisional hernia without obstruction or gangrene: Secondary | ICD-10-CM

## 2019-09-29 HISTORY — DX: Incisional hernia without obstruction or gangrene: K43.2

## 2019-09-29 HISTORY — DX: Personal history of other infectious and parasitic diseases: Z86.19

## 2019-09-29 HISTORY — DX: Diverticulitis of large intestine without perforation or abscess without bleeding: K57.32

## 2019-10-29 ENCOUNTER — Encounter: Payer: Self-pay | Admitting: Plastic Surgery

## 2019-10-29 ENCOUNTER — Other Ambulatory Visit: Payer: Self-pay

## 2019-10-29 ENCOUNTER — Ambulatory Visit (INDEPENDENT_AMBULATORY_CARE_PROVIDER_SITE_OTHER): Payer: Medicaid Other | Admitting: Plastic Surgery

## 2019-10-29 VITALS — BP 122/92 | HR 97 | Temp 97.7°F | Ht 69.0 in | Wt 260.8 lb

## 2019-10-29 DIAGNOSIS — K439 Ventral hernia without obstruction or gangrene: Secondary | ICD-10-CM

## 2019-10-29 DIAGNOSIS — Z411 Encounter for cosmetic surgery: Secondary | ICD-10-CM | POA: Diagnosis not present

## 2019-10-29 NOTE — Progress Notes (Signed)
Referring Provider Lonie Peak, PA-C [address] [place],  [place] [address]   CC:  Chief Complaint  Patient presents with   Advice Only      Megan [name] is an 39 y.o. female.  HPI: Patient presents to discuss her abdominal wall.  In August 2020 she had perforated diverticulitis and had a sigmoid colectomy and loop ileostomy.  She had a lot of problems healing her midline wound and multiple infections with a long period of wound care.  She ultimately healed and was able to have her ileostomy reversed.  She has developed multiple midline hernias in addition to 1 right of the midline where her ileostomy was.  She was sent to me by Dr. Johnsie Cancel her neurosurgeon to discuss abdominal wall reconstruction.  She has seen Dr. Carolynne Edouard in town who referred her to [facility] but she did not end up having surgery down there for a number of reasons.  She has also had an appointment virtually with [facility] and may ultimately pursue repair with them.  She is here today to discuss all of her options.  She is also bothered by the fullness in her neck.  She wants to discuss liposuction in that area.    Outpatient Encounter Medications as of 10/29/2019  Medication Sig   atorvastatin (LIPITOR) 40 MG tablet Take 40 mg by mouth daily.   diazepam (VALIUM) 5 MG tablet Take 1 tablet by mouth daily as needed.   methocarbamol (ROBAXIN) 500 MG tablet Take 500 mg by mouth 2 (two) times a day.   omeprazole (PRILOSEC) 20 MG capsule Take 20 mg by mouth daily.    timolol (BETIMOL) 0.5 % ophthalmic solution Place 1 drop into both eyes 2 (two) times daily.   timolol (TIMOPTIC) 0.5 % ophthalmic solution INSTILL 1 DROP IN AFFECTED EYE(S) DAILY AT BEDTIME   celecoxib (CELEBREX) 200 MG capsule Take 200 mg by mouth 2 (two) times daily. (Patient not taking: Reported on 10/29/2019)   elvitegravir-cobicistat-emtricitabine-tenofovir (GENVOYA) 150-150-200-10 MG TABS tablet Take 1 tablet by mouth daily  with breakfast. (Patient not taking: Reported on 07/14/2018)   fluticasone (FLONASE) 50 MCG/ACT nasal spray Place 2 sprays into both nostrils daily as needed for allergies.   gabapentin (NEURONTIN) 300 MG capsule Take 600 mg by mouth 3 (three) times daily.   hydrOXYzine (VISTARIL) 50 MG capsule Take 50 mg by mouth 3 (three) times daily as needed for itching. (Patient not taking: Reported on 10/29/2019)   lisinopril (ZESTRIL) 20 MG tablet Take 20 mg by mouth daily.   oxyCODONE 10 MG TABS Take 1 tablet (10 mg total) by mouth every 4 (four) hours as needed (pain).   No facility-administered encounter medications on file as of 10/29/2019.     Past Medical History:  Diagnosis Date   Cirrhosis (HCC)    Dyslipidemia    GERD (gastroesophageal reflux disease)    Hepatitis C    s/p treatment   Hypercholesteremia    Obesity    PCOS (polycystic ovarian syndrome)    PTSD (post-traumatic stress disorder)    Steatosis     Past Surgical History:  Procedure Laterality Date   HAND SURGERY Left    KNEE ARTHROSCOPY Right    LIVER BIOPSY     LUMBAR LAMINECTOMY/DECOMPRESSION MICRODISCECTOMY N/A 07/15/2018   Procedure: L5-S1 MINIMALLY INVASIVE LUMBAR DISCECTOMY;  Surgeon: Jadene Pierini, MD;  Location: [facility] OR;  Service: Neurosurgery;  Laterality: N/A;   TONSILLECTOMY      Family History  Problem Relation Age of Onset   High blood pressure Mother    Heart attack Father    High blood pressure Father    Heart attack Paternal Grandfather        11s   Colon cancer Maternal Grandfather    Breast cancer Maternal Aunt     She denies smoking cigarettes at this time.  Review of Systems General: Denies fevers, chills, weight loss CV: Denies chest pain, shortness of breath, palpitations  Physical Exam Vitals with BMI 10/29/2019 07/16/2018 07/16/2018  Height 5\' 9"  - -  Weight 260 lbs 13 oz - -  BMI 38.5 - -  Systolic 122 136  Diastolic 92 80 56  Pulse 97 80 69  Some  encounter information is confidential and restricted. Go to Review Flowsheets activity to see all data.    General:  No acute distress,  Alert and oriented, Non-Toxic, Normal speech and affect Abdomen: Abdomen is soft and mildly tender.  She has multiple palpable hernia defects along her midline incision and off to the right lower quadrant where her ileostomy was.  There are no wounds and there is a widened midline scar. Neck: She has significant amount of adipose tissue in the neck most prominent beneath the chin.  Skin quality is good.  No obvious scars.  CT scan confirms multiple midline hernia defects and ileostomy site hernia chronically incarcerated  Assessment/Plan Patient presents with a complex abdominal wall ventral hernia.  I had a long discussion with the patient about her options.  She is currently trying to lose weight as recommended through the [facility] group to get her BMI.down to be a candidate for hernia repair and reconstruction.  I explained that in order for me to be involved we would need to find a general surgeon who would be willing to assist.  I will plan to reach out to Dr. [facility] to see if there is anybody else in town who might be able to offer her a consult.  If not then I recommended pursuing the [facility] option.  In regards to her neck she asked about liposuction and I think she would be a reasonable candidate for this.  I explained the limitations of liposuction and that it would not make the muscles any tighter but I expect that her skin quality is good enough that once the liposuction was performed the overall contour would be improved.  I explained where the scars would be in the risks.  She wants to look into this and will provide a quote for her.    [facility] 10/29/2019, 1:46 PM

## 2019-12-02 ENCOUNTER — Other Ambulatory Visit: Payer: Self-pay | Admitting: Nurse Practitioner

## 2019-12-02 DIAGNOSIS — K7460 Unspecified cirrhosis of liver: Secondary | ICD-10-CM

## 2019-12-10 ENCOUNTER — Ambulatory Visit
Admission: RE | Admit: 2019-12-10 | Discharge: 2019-12-10 | Disposition: A | Discharge status: Home / Self Care | Payer: Medicaid Other | Source: Ambulatory Visit | Attending: Nurse Practitioner | Admitting: Nurse Practitioner

## 2019-12-10 DIAGNOSIS — K7460 Unspecified cirrhosis of liver: Secondary | ICD-10-CM

## 2019-12-10 IMAGING — US US ABDOMEN LIMITED
1 series · 14 of 25 positions shown · non-contrast
Comparison: Right upper quadrant ultrasound dated [DATE].

CLINICAL DATA: 39-year-old female with cirrhosis.

EXAM:
ULTRASOUND ABDOMEN LIMITED RIGHT UPPER QUADRANT

[Series 1: us abdomen limited · 0.26mm/px · 14 of 39 slices shown]
[im 1/39]
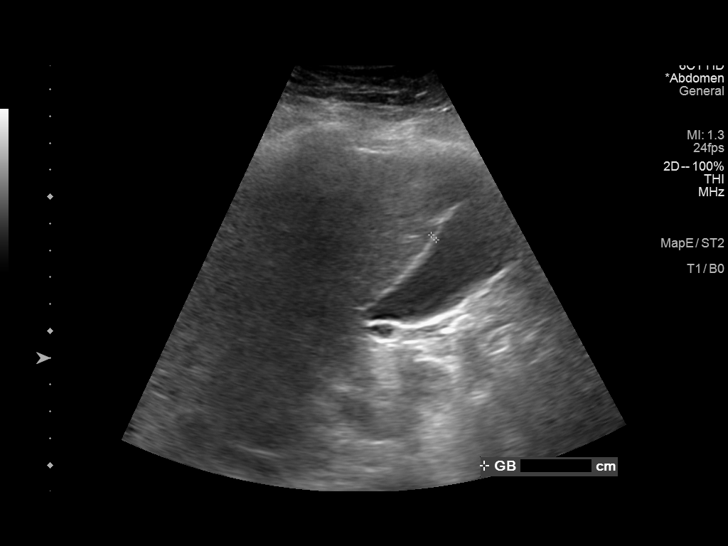
[im 4/39]
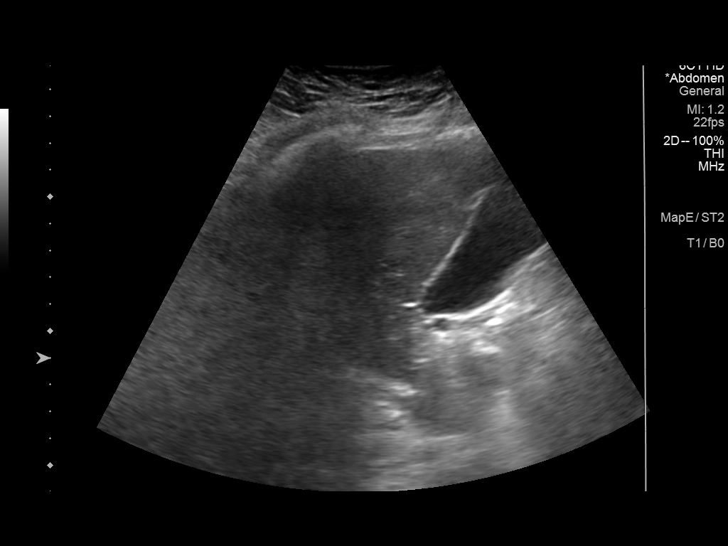
[im 7/39]
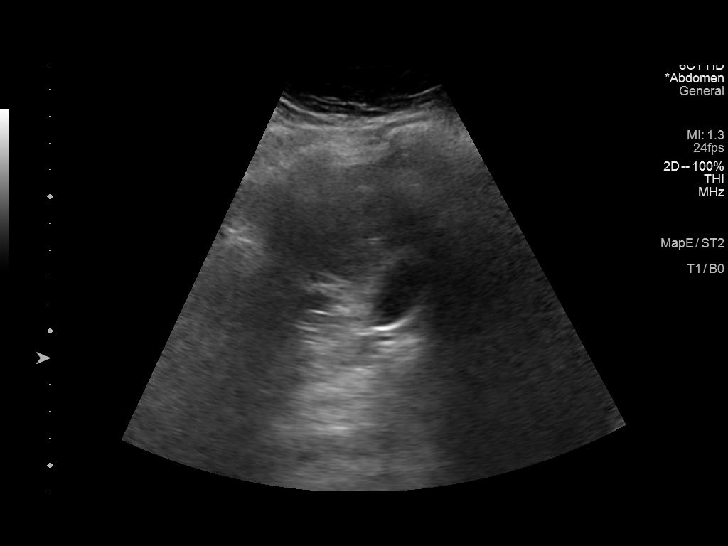
[im 10/39]
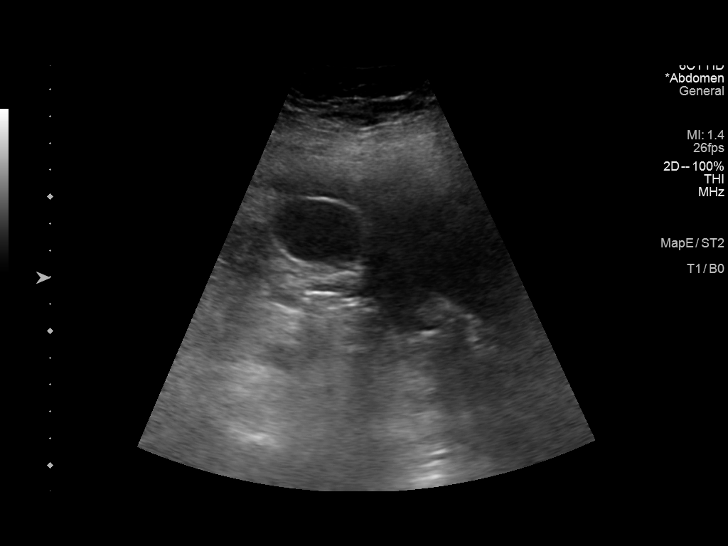
[im 13/39]
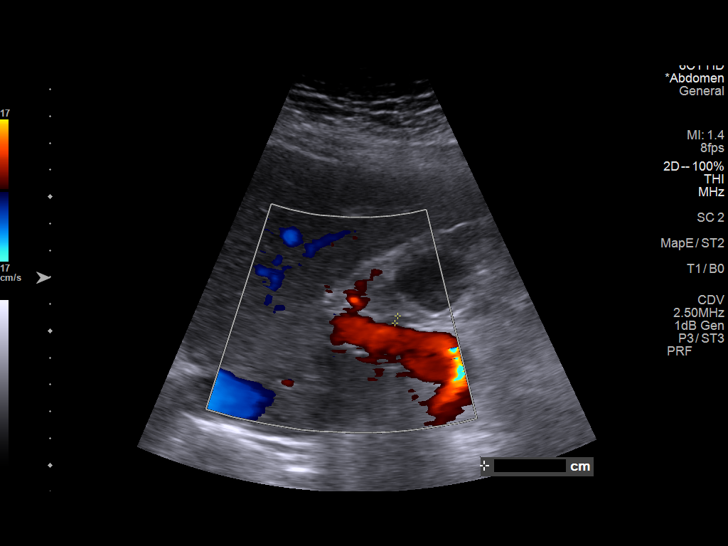
[im 15/39]
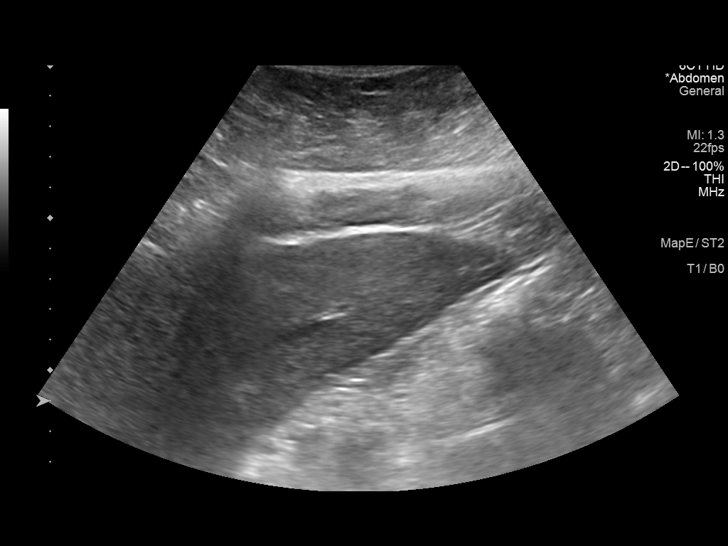
[im 18/39]
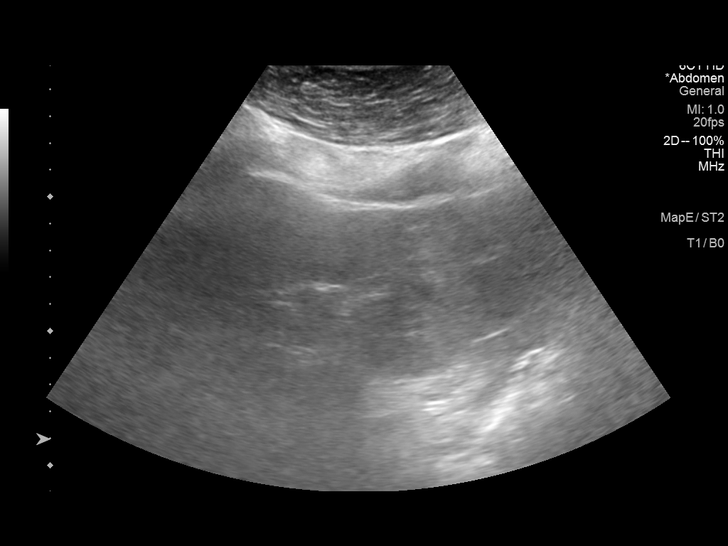
[im 21/39]
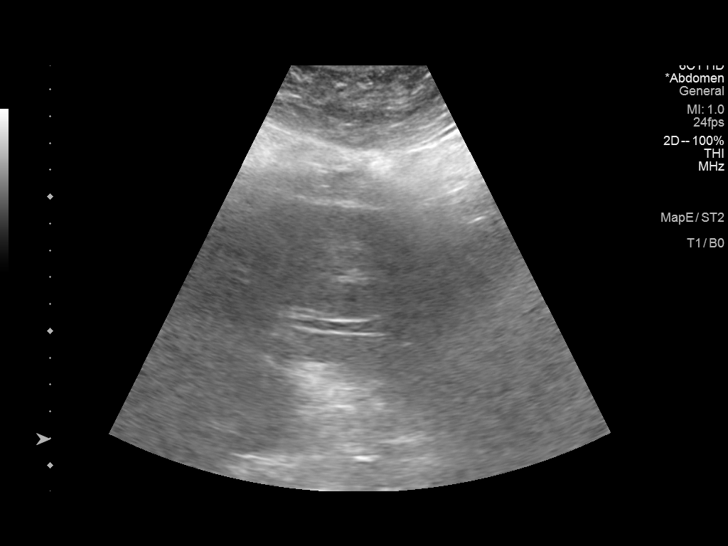
[im 24/39]
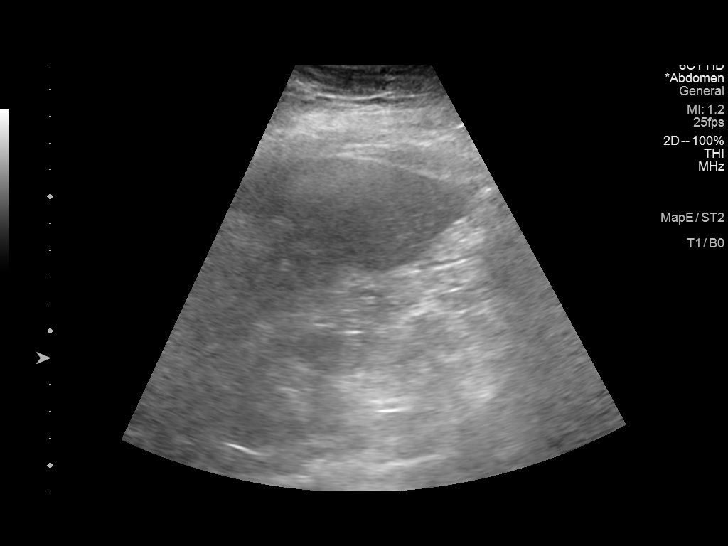
[im 26/39]
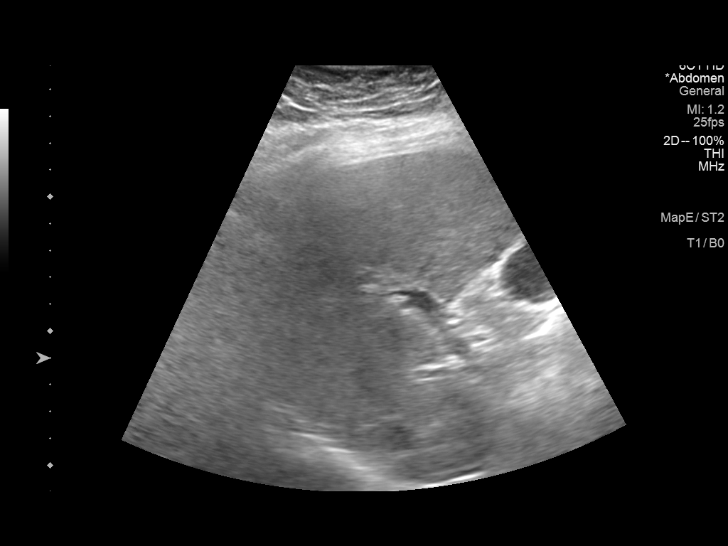
[im 29/39]
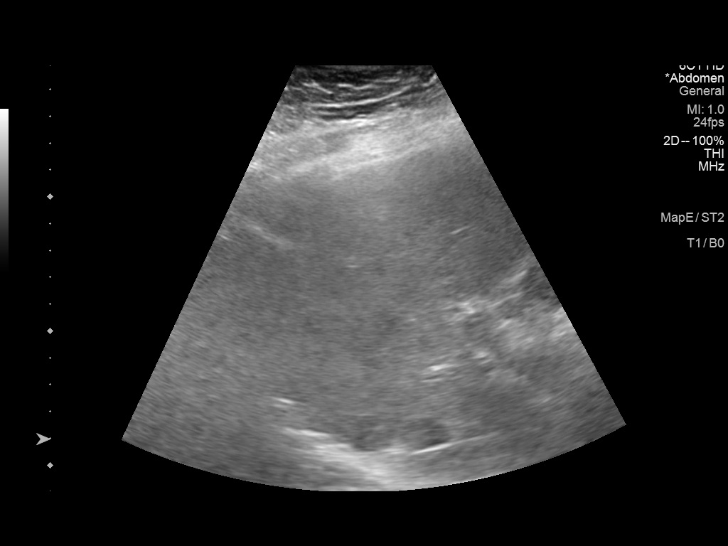
[im 32/39]
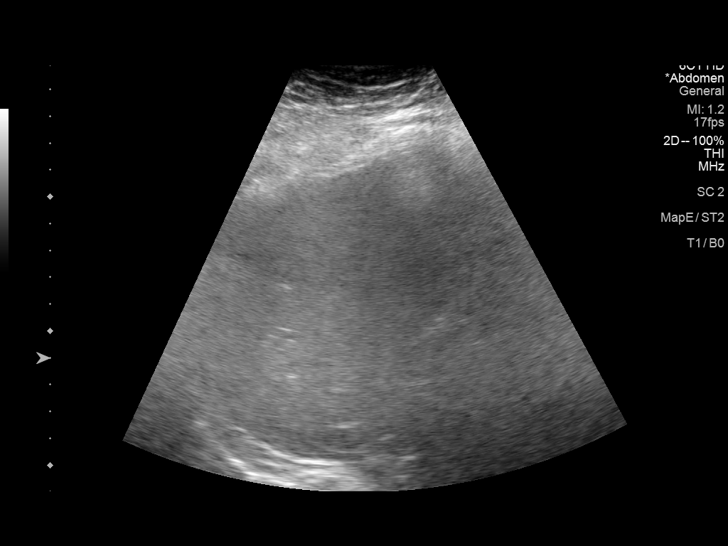
[im 35/39]
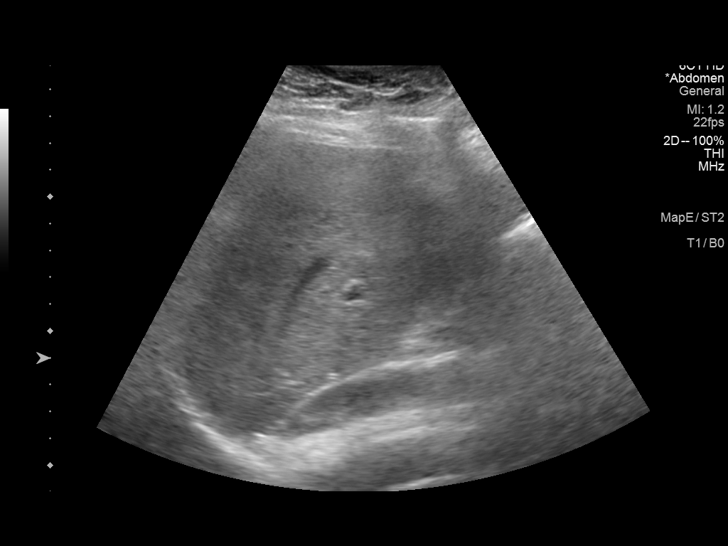
[im 39/39]
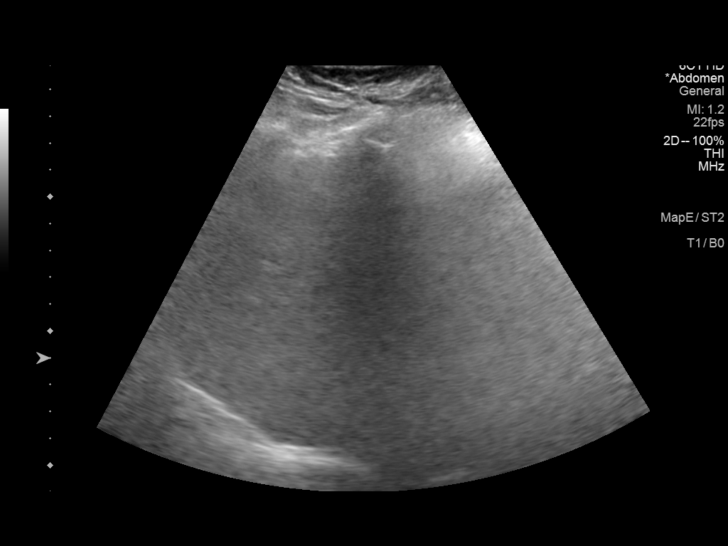

[14 of 25 positions shown; findings below may reference images not displayed]

FINDINGS: Gallbladder:

No gallstones or wall thickening visualized. No sonographic Murphy
sign noted by sonographer.

Common bile duct:

Diameter: 3 mm

Liver:

There is diffuse increased liver echogenicity with mild surface
irregularity in keeping with cirrhosis. Portal vein is patent on
color Doppler imaging with normal direction of blood flow towards
the liver.

Other: None.
IMPRESSION: 1. Cirrhosis.
2. Patent main portal vein with hepatopetal flow.
3. No ascites.

## 2020-02-08 ENCOUNTER — Encounter: Payer: Self-pay | Admitting: Intensive Care

## 2020-02-08 ENCOUNTER — Emergency Department: Payer: No Typology Code available for payment source

## 2020-02-08 ENCOUNTER — Emergency Department
Admission: EM | Admit: 2020-02-08 | Discharge: 2020-02-08 | Disposition: A | Discharge status: Home / Self Care | Payer: No Typology Code available for payment source | Attending: Emergency Medicine | Admitting: Emergency Medicine

## 2020-02-08 ENCOUNTER — Other Ambulatory Visit: Payer: Self-pay

## 2020-02-08 DIAGNOSIS — R079 Chest pain, unspecified: Secondary | ICD-10-CM | POA: Diagnosis not present

## 2020-02-08 DIAGNOSIS — Z87891 Personal history of nicotine dependence: Secondary | ICD-10-CM | POA: Diagnosis not present

## 2020-02-08 DIAGNOSIS — M542 Cervicalgia: Secondary | ICD-10-CM | POA: Diagnosis not present

## 2020-02-08 DIAGNOSIS — M25562 Pain in left knee: Secondary | ICD-10-CM | POA: Insufficient documentation

## 2020-02-08 DIAGNOSIS — R0682 Tachypnea, not elsewhere classified: Secondary | ICD-10-CM | POA: Diagnosis not present

## 2020-02-08 DIAGNOSIS — M791 Myalgia, unspecified site: Secondary | ICD-10-CM | POA: Diagnosis not present

## 2020-02-08 DIAGNOSIS — M549 Dorsalgia, unspecified: Secondary | ICD-10-CM | POA: Diagnosis not present

## 2020-02-08 DIAGNOSIS — Y92410 Unspecified street and highway as the place of occurrence of the external cause: Secondary | ICD-10-CM | POA: Diagnosis not present

## 2020-02-08 LAB — BASIC METABOLIC PANEL
Anion gap: 11 (ref 5–15)
BUN: 6 mg/dL (ref 6–20)
CO2: 23 mmol/L (ref 22–32)
Calcium: 9.2 mg/dL (ref 8.9–10.3)
Chloride: 105 mmol/L (ref 98–111)
Creatinine, Ser: 0.67 mg/dL (ref 0.44–1.00)
GFR, Estimated: >60 mL/min (ref >60)
Glucose, Bld: 90 mg/dL (ref 70–99)
Potassium: 3.8 mmol/L (ref 3.5–5.1)
Sodium: 139 mmol/L (ref 135–145)

## 2020-02-08 LAB — CBC
HCT: 41.5 % (ref 36.0–46.0)
Hemoglobin: 13.6 g/dL (ref 12.0–15.0)
MCH: 31.4 pg (ref 26.0–34.0)
MCHC: 32.8 g/dL (ref 30.0–36.0)
MCV: 95.8 fL (ref 80.0–100.0)
Platelets: 295 10*3/uL (ref 150–400)
RBC: 4.33 MIL/uL (ref 3.87–5.11)
RDW: 14.2 % (ref 11.5–15.5)
WBC: 7.9 10*3/uL (ref 4.0–10.5)
nRBC: 0 % (ref 0.0–0.2)

## 2020-02-08 LAB — TROPONIN I (HIGH SENSITIVITY)
Troponin I (High Sensitivity): 3 ng/L (ref <18)
Troponin I (High Sensitivity): 3 ng/L (ref <18)

## 2020-02-08 LAB — POC URINE PREG, ED: Preg Test, Ur: NEGATIVE
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
## 2020-02-08 MED ORDER — FENTANYL CITRATE (PF) 100 MCG/2ML IJ SOLN
50.0000 ug | Freq: Once | INTRAMUSCULAR | Status: AC
  Administered 2020-02-08: 16:00:00 50 ug via INTRAVENOUS
  Filled 2020-02-08: qty 2

## 2020-02-08 MED ORDER — MORPHINE SULFATE (PF) 4 MG/ML IV SOLN
4.0000 mg | Freq: Once | INTRAVENOUS | Status: AC
  Administered 2020-02-08: 14:00:00 4 mg via INTRAVENOUS
  Filled 2020-02-08 (×2): qty 1

## 2020-02-08 MED ORDER — IOHEXOL 300 MG/ML  SOLN
125.0000 mL | Freq: Once | INTRAMUSCULAR | Status: AC | PRN
  Administered 2020-02-08: 15:00:00 125 mL via INTRAVENOUS

## 2020-02-08 MED ORDER — CYCLOBENZAPRINE HCL 5 MG PO TABS: 5.0000 mg | ORAL_TABLET | Freq: Three times a day (TID) | ORAL | 0 refills | Status: DC | PRN

## 2020-02-08 MED ORDER — FENTANYL CITRATE (PF) 100 MCG/2ML IJ SOLN
50.0000 ug | Freq: Once | INTRAMUSCULAR | Status: AC
  Administered 2020-02-08: 15:00:00 50 ug via INTRAVENOUS
  Filled 2020-02-08: qty 2

## 2020-02-08 NOTE — ED Notes (Signed)
Patient transported to CT 

## 2020-02-08 NOTE — Discharge Instructions (Addendum)
Please seek medical attention for any high fevers, chest pain, shortness of breath, change in behavior, persistent vomiting, bloody stool or any other new or concerning symptoms.  

## 2020-02-08 NOTE — ED Provider Notes (Signed)
Patient ct scans without any obvious acute traumatic injuries. Post surgical changes in the abdomen. Left knee x-ray without fracture. Discussed findings with patient. Will plan on discharging home with muscle relaxer.    Phineas Semen, MD 02/08/20 [phone]

## 2020-02-08 NOTE — ED Notes (Signed)
Returned from CT at this time.

## 2020-02-08 NOTE — ED Notes (Signed)
Pt states IV burning. Site infiltrated and removed at this time. Provider aware that medication likely went SQ due to infiltration.
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
## 2020-02-08 NOTE — ED Notes (Signed)
Patient transported to X-ray 
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
## 2020-03-10 DIAGNOSIS — M25512 Pain in left shoulder: Secondary | ICD-10-CM | POA: Insufficient documentation

## 2020-03-10 HISTORY — DX: Pain in left shoulder: M25.512

## 2020-04-14 ENCOUNTER — Other Ambulatory Visit: Payer: Self-pay | Admitting: Orthopedic Surgery

## 2020-04-15 ENCOUNTER — Other Ambulatory Visit
Admission: RE | Admit: 2020-04-15 | Discharge: 2020-04-15 | Disposition: A | Discharge status: Home / Self Care | Payer: Medicaid Other | Source: Ambulatory Visit | Attending: Orthopedic Surgery | Admitting: Orthopedic Surgery

## 2020-04-15 ENCOUNTER — Other Ambulatory Visit: Payer: Self-pay

## 2020-04-15 DIAGNOSIS — Z01812 Encounter for preprocedural laboratory examination: Secondary | ICD-10-CM | POA: Diagnosis not present

## 2020-04-15 DIAGNOSIS — Z20822 Contact with and (suspected) exposure to covid-19: Secondary | ICD-10-CM | POA: Diagnosis not present

## 2020-04-15 HISTORY — DX: Diverticulosis of intestine, part unspecified, without perforation or abscess without bleeding: K57.90

## 2020-04-15 HISTORY — DX: Other specified postprocedural states: Z98.890

## 2020-04-15 NOTE — Patient Instructions (Addendum)
Your procedure is scheduled on: 04/20/2020 Report to the Registration Desk on the 1st floor of the [facility]. To find out your arrival time, please call [phone] between 1PM - 3PM on: 04/19/2020  REMEMBER: Instructions that are not followed completely may result in serious medical risk, up to and including death; or upon the discretion of your surgeon and anesthesiologist your surgery may need to be rescheduled.  Do not eat food or drink any fluid after midnight the night before surgery.  No gum chewing, lozengers or hard candies.   TAKE THESE MEDICATIONS THE MORNING OF SURGERY WITH A SIP OF WATER: - timolol (TIMOPTIC) 0.5 % ophthalmic solution - omeprazole (PRILOSEC) 20 MG capsule, take one the night before and one on the morning of surgery - helps to prevent nausea after surgery. - atorvastatin (LIPITOR) 40 MG tablet  One week prior to surgery: Stop Anti-inflammatories (NSAIDS) such as Advil, Aleve, Ibuprofen, Motrin, Naproxen, Naprosyn and Aspirin based products such as Excedrin, Goodys Powder, BC Powder.  Stop ANY OVER THE COUNTER supplements until after surgery.  No Alcohol for 24 hours before or after surgery.  No Smoking including e-cigarettes for 24 hours prior to surgery.  No chewable tobacco products for at least 6 hours prior to surgery.  No nicotine patches on the day of surgery.  Do not use any "recreational" drugs for at least a week prior to your surgery.  Please be advised that the combination of cocaine and anesthesia may have negative outcomes, up to and including death. If you test positive for cocaine, your surgery will be cancelled.  On the morning of surgery brush your teeth with toothpaste and water, you may rinse your mouth with mouthwash if you wish. Do not swallow any toothpaste or mouthwash.  Do not wear jewelry, make-up, hairpins, clips or nail polish.  Do not wear lotions, powders, or perfumes.   Do not shave body from the neck down 48 hours  prior to surgery just in case you cut yourself which could leave a site for infection.  Also, freshly shaved skin may become irritated if using the CHG soap.  Contact lenses, hearing aids and dentures may not be worn into surgery.  Do not bring valuables to the hospital. [facility] is not responsible for any missing/lost belongings or valuables.   Use CHG Soap or wipes as directed on instruction sheet.  Notify your doctor if there is any change in your medical condition (cold, fever, infection).  Wear comfortable clothing (specific to your surgery type) to the hospital.  Plan for stool softeners for home use; pain medications have a tendency to cause constipation. You can also help prevent constipation by eating foods high in fiber such as fruits and vegetables and drinking plenty of fluids as your diet allows.  After surgery, you can help prevent lung complications by doing breathing exercises.  Take deep breaths and cough every 1-2 hours. Your doctor may order a device called an Incentive Spirometer to help you take deep breaths. When coughing or sneezing, hold a pillow firmly against your incision with both hands. This is called "splinting." Doing this helps protect your incision. It also decreases belly discomfort.  If you are being admitted to the hospital overnight, leave your suitcase in the car. After surgery it may be brought to your room.  If you are being discharged the day of surgery, you will not be allowed to drive home. You will need a responsible adult (18 years or older) to drive  you home and stay with you that night.   If you are taking public transportation, you will need to have a responsible adult (18 years or older) with you. Please confirm with your physician that it is acceptable to use public transportation.   Please call the Pre-admissions Testing Dept. at [phone] if you have any questions about these instructions.  Visitation Policy:  Patients  undergoing a surgery or procedure may have one family member or support person with them as long as that person is not COVID-19 positive or experiencing its symptoms.  That person may remain in the waiting area during the procedure.  Inpatient Visitation:    Visiting hours are 7 a.m. to 8 p.m. Patients will be allowed one visitor. The visitor may change daily. The visitor must pass COVID-19 screenings, use hand sanitizer when entering and exiting the patient's room and wear a mask at all times, including in the patient's room. Patients must also wear a mask when staff or their visitor are in the room. Masking is required regardless of vaccination status. Systemwide, no visitors 17 or younger.  Visitation Policy Changes: The following changes are to take effect on Feb. 28 at 7 a.m.  No visitors under the age of 62. Any visitor under the age of 82 must be accompanied by an adult. Adult inpatients: Two visitors will be allowed daily and the visitors may change each day during the patient's stay. (Please note -- no changes at this time for the [facility], [facility] Department and inpatients, Emergency Departments, Ambulatory Sites, [facility], medical practices and procedural areas.)

## 2020-04-16 ENCOUNTER — Encounter
Admission: RE | Admit: 2020-04-16 | Discharge: 2020-04-16 | Disposition: A | Discharge status: Home / Self Care | Payer: Medicaid Other | Source: Ambulatory Visit | Attending: Orthopedic Surgery | Admitting: Orthopedic Surgery

## 2020-04-16 DIAGNOSIS — Z01812 Encounter for preprocedural laboratory examination: Secondary | ICD-10-CM | POA: Diagnosis not present

## 2020-04-16 LAB — CBC WITH DIFFERENTIAL/PLATELET
Abs Immature Granulocytes: 0.02 10*3/uL (ref 0.00–0.07)
Basophils Absolute: 0.1 10*3/uL (ref 0.0–0.1)
Basophils Relative: 1 %
Eosinophils Absolute: 0.4 10*3/uL (ref 0.0–0.5)
Eosinophils Relative: 5 %
HCT: 36.7 % (ref 36.0–46.0)
Hemoglobin: 11.9 g/dL — ABNORMAL LOW (ref 12.0–15.0)
Immature Granulocytes: 0 %
Lymphocytes Relative: 22 %
Lymphs Abs: 1.4 10*3/uL (ref 0.7–4.0)
MCH: 29.7 pg (ref 26.0–34.0)
MCHC: 32.4 g/dL (ref 30.0–36.0)
MCV: 91.5 fL (ref 80.0–100.0)
Monocytes Absolute: 0.8 10*3/uL (ref 0.1–1.0)
Monocytes Relative: 11 %
Neutro Abs: 4 10*3/uL (ref 1.7–7.7)
Neutrophils Relative %: 61 %
Platelets: 221 10*3/uL (ref 150–400)
RBC: 4.01 MIL/uL (ref 3.87–5.11)
RDW: 14.1 % (ref 11.5–15.5)
WBC: 6.7 10*3/uL (ref 4.0–10.5)
nRBC: 0 % (ref 0.0–0.2)

## 2020-04-16 LAB — BASIC METABOLIC PANEL
Anion gap: 7 (ref 5–15)
BUN: 11 mg/dL (ref 6–20)
CO2: 25 mmol/L (ref 22–32)
Calcium: 8.8 mg/dL — ABNORMAL LOW (ref 8.9–10.3)
Chloride: 104 mmol/L (ref 98–111)
Creatinine, Ser: 0.65 mg/dL (ref 0.44–1.00)
GFR, Estimated: >60 mL/min (ref >60)
Glucose, Bld: 90 mg/dL (ref 70–99)
Potassium: 3.5 mmol/L (ref 3.5–5.1)
Sodium: 136 mmol/L (ref 135–145)

## 2020-04-16 LAB — APTT: aPTT: 29 seconds (ref 24–36)

## 2020-04-16 LAB — PROTIME-INR
INR: 1 (ref 0.8–1.2)
Prothrombin Time: 12.9 seconds (ref 11.4–15.2)

## 2020-04-16 LAB — SARS CORONAVIRUS 2 (TAT 6-24 HRS): SARS Coronavirus 2: NEGATIVE

## 2020-04-19 MED ORDER — CHLORHEXIDINE GLUCONATE 0.12 % MT SOLN: 15.0000 mL | Freq: Once | OROMUCOSAL | Status: AC

## 2020-04-19 MED ORDER — CHLORHEXIDINE GLUCONATE CLOTH 2 % EX PADS: 6.0000 | MEDICATED_PAD | Freq: Once | CUTANEOUS | Status: DC

## 2020-04-19 MED ORDER — LACTATED RINGERS IV SOLN: INTRAVENOUS | Status: DC

## 2020-04-19 MED ORDER — ORAL CARE MOUTH RINSE: 15.0000 mL | Freq: Once | OROMUCOSAL | Status: AC

## 2020-04-19 MED ORDER — ACETAMINOPHEN 500 MG PO TABS: 1000.0000 mg | ORAL_TABLET | ORAL | Status: AC

## 2020-04-19 MED ORDER — CEFAZOLIN SODIUM-DEXTROSE 2-4 GM/100ML-% IV SOLN
2.0000 g | INTRAVENOUS | Status: AC
  Administered 2020-04-20: 2 g via INTRAVENOUS

## 2020-04-20 ENCOUNTER — Ambulatory Visit
Admission: RE | Admit: 2020-04-20 | Discharge: 2020-04-20 | Disposition: A | Discharge status: Home / Self Care | Payer: Medicaid Other | Attending: Orthopedic Surgery | Admitting: Orthopedic Surgery

## 2020-04-20 ENCOUNTER — Ambulatory Visit: Payer: Medicaid Other

## 2020-04-20 ENCOUNTER — Ambulatory Visit: Payer: Medicaid Other | Admitting: Certified Registered"

## 2020-04-20 ENCOUNTER — Encounter: Payer: Self-pay | Admitting: Orthopedic Surgery

## 2020-04-20 ENCOUNTER — Encounter
Admission: RE | Disposition: A | Discharge status: Home / Self Care | Payer: Self-pay | Source: Home / Self Care | Attending: Orthopedic Surgery

## 2020-04-20 DIAGNOSIS — Z79899 Other long term (current) drug therapy: Secondary | ICD-10-CM | POA: Insufficient documentation

## 2020-04-20 DIAGNOSIS — K219 Gastro-esophageal reflux disease without esophagitis: Secondary | ICD-10-CM | POA: Insufficient documentation

## 2020-04-20 DIAGNOSIS — E785 Hyperlipidemia, unspecified: Secondary | ICD-10-CM | POA: Insufficient documentation

## 2020-04-20 DIAGNOSIS — E7800 Pure hypercholesterolemia, unspecified: Secondary | ICD-10-CM | POA: Diagnosis not present

## 2020-04-20 DIAGNOSIS — X58XXXA Exposure to other specified factors, initial encounter: Secondary | ICD-10-CM | POA: Diagnosis not present

## 2020-04-20 DIAGNOSIS — Z8249 Family history of ischemic heart disease and other diseases of the circulatory system: Secondary | ICD-10-CM | POA: Diagnosis not present

## 2020-04-20 DIAGNOSIS — S43432A Superior glenoid labrum lesion of left shoulder, initial encounter: Secondary | ICD-10-CM | POA: Diagnosis not present

## 2020-04-20 DIAGNOSIS — Z8619 Personal history of other infectious and parasitic diseases: Secondary | ICD-10-CM | POA: Diagnosis not present

## 2020-04-20 DIAGNOSIS — K7460 Unspecified cirrhosis of liver: Secondary | ICD-10-CM | POA: Insufficient documentation

## 2020-04-20 DIAGNOSIS — M25512 Pain in left shoulder: Secondary | ICD-10-CM

## 2020-04-20 DIAGNOSIS — E669 Obesity, unspecified: Secondary | ICD-10-CM | POA: Insufficient documentation

## 2020-04-20 DIAGNOSIS — Z87891 Personal history of nicotine dependence: Secondary | ICD-10-CM | POA: Insufficient documentation

## 2020-04-20 HISTORY — PX: SHOULDER ARTHROSCOPY WITH LABRAL REPAIR: SHX5691

## 2020-04-20 LAB — POCT PREGNANCY, URINE: Preg Test, Ur: NEGATIVE

## 2020-04-20 SURGERY — ARTHROSCOPY, SHOULDER, WITH GLENOID LABRUM REPAIR
Anesthesia: General | Site: Shoulder | Laterality: Left

## 2020-04-20 MED ORDER — SODIUM CHLORIDE 0.9 % IV SOLN
INTRAVENOUS | Status: DC | PRN
  Administered 2020-04-20: 20 ug/min via INTRAVENOUS

## 2020-04-20 MED ORDER — MIDAZOLAM HCL 2 MG/2ML IJ SOLN
INTRAMUSCULAR | Status: AC
  Administered 2020-04-20: 1 mg via INTRAVENOUS
  Filled 2020-04-20: qty 2

## 2020-04-20 MED ORDER — LIDOCAINE HCL (PF) 2 % IJ SOLN
INTRAMUSCULAR | Status: AC
  Filled 2020-04-20: qty 5

## 2020-04-20 MED ORDER — MIDAZOLAM HCL 2 MG/2ML IJ SOLN: 1.0000 mg | Freq: Once | INTRAMUSCULAR | Status: DC

## 2020-04-20 MED ORDER — FENTANYL CITRATE (PF) 100 MCG/2ML IJ SOLN: 50.0000 ug | Freq: Once | INTRAMUSCULAR | Status: DC

## 2020-04-20 MED ORDER — DEXAMETHASONE SODIUM PHOSPHATE 10 MG/ML IJ SOLN
INTRAMUSCULAR | Status: DC | PRN
  Administered 2020-04-20: 8 mg via INTRAVENOUS

## 2020-04-20 MED ORDER — MIDAZOLAM HCL 2 MG/2ML IJ SOLN: 1.0000 mg | Freq: Once | INTRAMUSCULAR | Status: AC

## 2020-04-20 MED ORDER — LIDOCAINE HCL (PF) 1 % IJ SOLN
INTRAMUSCULAR | Status: AC
  Filled 2020-04-20: qty 30

## 2020-04-20 MED ORDER — DEXAMETHASONE SODIUM PHOSPHATE 10 MG/ML IJ SOLN
INTRAMUSCULAR | Status: AC
  Filled 2020-04-20: qty 1

## 2020-04-20 MED ORDER — FENTANYL CITRATE (PF) 100 MCG/2ML IJ SOLN
50.0000 ug | Freq: Once | INTRAMUSCULAR | Status: AC
Start: 2020-04-20 — End: 2020-04-20

## 2020-04-20 MED ORDER — PROMETHAZINE HCL 25 MG/ML IJ SOLN
INTRAMUSCULAR | Status: AC
  Administered 2020-04-20: 6.25 mg via INTRAVENOUS
  Filled 2020-04-20: qty 1

## 2020-04-20 MED ORDER — MIDAZOLAM HCL 2 MG/2ML IJ SOLN
INTRAMUSCULAR | Status: DC | PRN
  Administered 2020-04-20: 2 mg via INTRAVENOUS

## 2020-04-20 MED ORDER — ONDANSETRON HCL 4 MG PO TABS: 4.0000 mg | ORAL_TABLET | Freq: Three times a day (TID) | ORAL | 0 refills | Status: DC | PRN

## 2020-04-20 MED ORDER — BUPIVACAINE HCL (PF) 0.25 % IJ SOLN
INTRAMUSCULAR | Status: AC
  Filled 2020-04-20: qty 30

## 2020-04-20 MED ORDER — LIDOCAINE HCL (PF) 1 % IJ SOLN
INTRAMUSCULAR | Status: AC
  Filled 2020-04-20: qty 5

## 2020-04-20 MED ORDER — KETOROLAC TROMETHAMINE 30 MG/ML IJ SOLN
INTRAMUSCULAR | Status: AC
  Filled 2020-04-20: qty 1

## 2020-04-20 MED ORDER — FENTANYL CITRATE (PF) 100 MCG/2ML IJ SOLN: 25.0000 ug | INTRAMUSCULAR | Status: DC | PRN

## 2020-04-20 MED ORDER — ACETAMINOPHEN 500 MG PO TABS
ORAL_TABLET | ORAL | Status: AC
  Administered 2020-04-20: 1000 mg via ORAL
  Filled 2020-04-20: qty 2

## 2020-04-20 MED ORDER — SUGAMMADEX SODIUM 200 MG/2ML IV SOLN
INTRAVENOUS | Status: DC | PRN
  Administered 2020-04-20: 200 mg via INTRAVENOUS

## 2020-04-20 MED ORDER — ROCURONIUM BROMIDE 100 MG/10ML IV SOLN
INTRAVENOUS | Status: DC | PRN
  Administered 2020-04-20: 20 mg via INTRAVENOUS
  Administered 2020-04-20: 10 mg via INTRAVENOUS
  Administered 2020-04-20: 50 mg via INTRAVENOUS
  Administered 2020-04-20: 20 mg via INTRAVENOUS

## 2020-04-20 MED ORDER — ROCURONIUM BROMIDE 10 MG/ML (PF) SYRINGE
PREFILLED_SYRINGE | INTRAVENOUS | Status: AC
  Filled 2020-04-20: qty 10

## 2020-04-20 MED ORDER — BUPIVACAINE HCL (PF) 0.5 % IJ SOLN
INTRAMUSCULAR | Status: DC | PRN
  Administered 2020-04-20: 10 mL via PERINEURAL

## 2020-04-20 MED ORDER — PROMETHAZINE HCL 25 MG/ML IJ SOLN: 6.2500 mg | Freq: Four times a day (QID) | INTRAMUSCULAR | Status: DC | PRN

## 2020-04-20 MED ORDER — BUPIVACAINE LIPOSOME 1.3 % IJ SUSP
INTRAMUSCULAR | Status: AC
  Filled 2020-04-20: qty 20

## 2020-04-20 MED ORDER — EPINEPHRINE PF 1 MG/ML IJ SOLN
INTRAMUSCULAR | Status: AC
  Filled 2020-04-20: qty 4

## 2020-04-20 MED ORDER — PROPOFOL 10 MG/ML IV BOLUS
INTRAVENOUS | Status: AC
  Filled 2020-04-20: qty 40

## 2020-04-20 MED ORDER — BUPIVACAINE HCL (PF) 0.5 % IJ SOLN
INTRAMUSCULAR | Status: AC
  Filled 2020-04-20: qty 10

## 2020-04-20 MED ORDER — FENTANYL CITRATE (PF) 100 MCG/2ML IJ SOLN
INTRAMUSCULAR | Status: AC
  Filled 2020-04-20: qty 2

## 2020-04-20 MED ORDER — OXYCODONE HCL 5 MG PO TABS: 5.0000 mg | ORAL_TABLET | ORAL | 0 refills | Status: DC | PRN

## 2020-04-20 MED ORDER — FENTANYL CITRATE (PF) 100 MCG/2ML IJ SOLN
INTRAMUSCULAR | Status: AC
  Administered 2020-04-20: 50 ug via INTRAVENOUS
  Filled 2020-04-20: qty 2

## 2020-04-20 MED ORDER — MIDAZOLAM HCL 2 MG/2ML IJ SOLN
INTRAMUSCULAR | Status: AC
  Filled 2020-04-20: qty 2

## 2020-04-20 MED ORDER — SODIUM CHLORIDE FLUSH 0.9 % IV SOLN
INTRAVENOUS | Status: AC
  Filled 2020-04-20: qty 10

## 2020-04-20 MED ORDER — LIDOCAINE HCL (PF) 1 % IJ SOLN
INTRAMUSCULAR | Status: DC | PRN
  Administered 2020-04-20: 3 mL

## 2020-04-20 MED ORDER — PROPOFOL 10 MG/ML IV BOLUS
INTRAVENOUS | Status: DC | PRN
  Administered 2020-04-20: 30 mg via INTRAVENOUS
  Administered 2020-04-20: 150 mg via INTRAVENOUS

## 2020-04-20 MED ORDER — CHLORHEXIDINE GLUCONATE 0.12 % MT SOLN
OROMUCOSAL | Status: AC
  Administered 2020-04-20: 15 mL via OROMUCOSAL
  Filled 2020-04-20: qty 15

## 2020-04-20 MED ORDER — BUPIVACAINE LIPOSOME 1.3 % IJ SUSP
INTRAMUSCULAR | Status: DC | PRN
  Administered 2020-04-20: 20 mL via PERINEURAL

## 2020-04-20 MED ORDER — CEFAZOLIN SODIUM-DEXTROSE 2-4 GM/100ML-% IV SOLN
INTRAVENOUS | Status: AC
  Filled 2020-04-20: qty 100

## 2020-04-20 MED ORDER — LIDOCAINE HCL (CARDIAC) PF 100 MG/5ML IV SOSY
PREFILLED_SYRINGE | INTRAVENOUS | Status: DC | PRN
  Administered 2020-04-20: 100 mg via INTRAVENOUS

## 2020-04-20 MED ORDER — ONDANSETRON HCL 4 MG/2ML IJ SOLN
INTRAMUSCULAR | Status: AC
  Filled 2020-04-20: qty 2

## 2020-04-20 MED ORDER — FENTANYL CITRATE (PF) 100 MCG/2ML IJ SOLN
INTRAMUSCULAR | Status: DC | PRN
  Administered 2020-04-20 (×2): 50 ug via INTRAVENOUS

## 2020-04-20 MED ORDER — ONDANSETRON HCL 4 MG/2ML IJ SOLN
4.0000 mg | Freq: Once | INTRAMUSCULAR | Status: AC | PRN
  Administered 2020-04-20: 4 mg via INTRAVENOUS

## 2020-04-20 SURGICAL SUPPLY — 64 items
ADAPTER IRRIG TUBE 2 SPIKE SOL (ADAPTER) ×4 IMPLANT
ADPR TBG 2 SPK PMP STRL ASCP (ADAPTER) ×2
ANCH SUT 2 SUTTK 14.5X3 (Anchor) ×5 IMPLANT
ANCHOR SUT BIOC ST 3X145 (Anchor) ×8 IMPLANT
BASIN GRAD PLASTIC 32OZ STRL (MISCELLANEOUS) ×1 IMPLANT
BUR RADIUS 4.0X18.5 (BURR) ×2 IMPLANT
BUR RADIUS 5.5 (BURR) ×2 IMPLANT
CANNULA 5.75X7 CRYSTAL CLEAR (CANNULA) ×4 IMPLANT
CANNULA PARTIAL THREAD 2X7 (CANNULA) ×2 IMPLANT
CANNULA TWIST IN 8.25X9CM (CANNULA) IMPLANT
COOLER POLAR GLACIER W/PUMP (MISCELLANEOUS) ×2 IMPLANT
COVER WAND RF STERILE (DRAPES) ×2 IMPLANT
DEVICE SUCT BLK HOLE OR FLOOR (MISCELLANEOUS) IMPLANT
DRAPE 3/4 80X56 (DRAPES) ×2 IMPLANT
DRAPE IMP U-DRAPE 54X76 (DRAPES) ×4 IMPLANT
DRAPE INCISE IOBAN 66X45 STRL (DRAPES) ×2 IMPLANT
DRAPE SHOULDER W/SPLIT SHEET (DRAPES) ×1 IMPLANT
DRAPE U-SHAPE 47X51 STRL (DRAPES) IMPLANT
DURAPREP 26ML APPLICATOR (WOUND CARE) ×6 IMPLANT
ELECT REM PT RETURN 9FT ADLT (ELECTROSURGICAL)
ELECTRODE REM PT RTRN 9FT ADLT (ELECTROSURGICAL) IMPLANT
GAUZE SPONGE 4X4 12PLY STRL (GAUZE/BANDAGES/DRESSINGS) ×2 IMPLANT
GAUZE XEROFORM 1X8 LF (GAUZE/BANDAGES/DRESSINGS) ×2 IMPLANT
GLOVE SURG 9.0 ORTHO LTXF (GLOVE) ×4 IMPLANT
GLOVE SURG UNDER POLY LF SZ9 (GLOVE) ×2 IMPLANT
GOWN STRL REUS TWL 2XL XL LVL4 (GOWN DISPOSABLE) ×2 IMPLANT
GOWN STRL REUS W/ TWL LRG LVL3 (GOWN DISPOSABLE) ×1 IMPLANT
GOWN STRL REUS W/ TWL LRG LVL4 (GOWN DISPOSABLE) ×1 IMPLANT
GOWN STRL REUS W/TWL LRG LVL3 (GOWN DISPOSABLE) ×2
GOWN STRL REUS W/TWL LRG LVL4 (GOWN DISPOSABLE) ×2
IV LACTATED RINGER IRRG 3000ML (IV SOLUTION) ×40
IV LR IRRIG 3000ML ARTHROMATIC (IV SOLUTION) ×6 IMPLANT
KIT STABILIZATION SHOULDER (MISCELLANEOUS) ×2 IMPLANT
KIT SUTURETAK 3.0 INSERT PERC (KITS) ×2 IMPLANT
KIT TURNOVER KIT A (KITS) ×2 IMPLANT
MANIFOLD NEPTUNE II (INSTRUMENTS) ×4 IMPLANT
MASK FACE SPIDER DISP (MASK) ×2 IMPLANT
MAT ABSORB  FLUID 56X50 GRAY (MISCELLANEOUS) ×4
MAT ABSORB FLUID 56X50 GRAY (MISCELLANEOUS) ×2 IMPLANT
NEEDLE HYPO 22GX1.5 SAFETY (NEEDLE) ×2 IMPLANT
PACK ARTHROSCOPY SHOULDER (MISCELLANEOUS) ×2 IMPLANT
PAD ABD DERMACEA PRESS 5X9 (GAUZE/BANDAGES/DRESSINGS) ×2 IMPLANT
PAD ARMBOARD 7.5X6 YLW CONV (MISCELLANEOUS) ×2 IMPLANT
PAD WRAPON POLAR SHDR XLG (MISCELLANEOUS) ×1 IMPLANT
SET TUBE SUCT SHAVER OUTFL 24K (TUBING) ×2 IMPLANT
SET TUBE TIP INTRA-ARTICULAR (MISCELLANEOUS) ×2 IMPLANT
STRAP SAFETY 5IN WIDE (MISCELLANEOUS) ×2 IMPLANT
STRIP CLOSURE SKIN 1/2X4 (GAUZE/BANDAGES/DRESSINGS) ×3 IMPLANT
SUT ETHILON 4-0 (SUTURE) ×6
SUT ETHILON 4-0 FS2 18XMFL BLK (SUTURE) ×3
SUT LASSO 90 DEG SD STR (SUTURE) ×2 IMPLANT
SUT MNCRL 4-0 (SUTURE) ×2
SUT MNCRL 4-0 27XMFL (SUTURE) ×1
SUT PDS AB 0 CT1 27 (SUTURE) IMPLANT
SUT ULTRABRAID 2 COBRAID 38 (SUTURE) IMPLANT
SUT VIC AB 0 CT1 36 (SUTURE) IMPLANT
SUT VIC AB 2-0 CT2 27 (SUTURE) IMPLANT
SUTURE ETHLN 4-0 FS2 18XMF BLK (SUTURE) ×1 IMPLANT
SUTURE MNCRL 4-0 27XMF (SUTURE) ×1 IMPLANT
TAPE MICROFOAM 4IN (TAPE) ×2 IMPLANT
TUBING ARTHRO INFLOW-ONLY STRL (TUBING) ×2 IMPLANT
TUBING CONNECTING 10 (TUBING) ×2 IMPLANT
WAND HAND CNTRL MULTIVAC 90 (MISCELLANEOUS) ×2 IMPLANT
WRAPON POLAR PAD SHDR XLG (MISCELLANEOUS) ×2

## 2020-04-20 NOTE — Anesthesia Preprocedure Evaluation (Addendum)
Anesthesia Evaluation  Patient identified by MRN, date of birth, ID band Patient awake    Reviewed: Allergy & Precautions, NPO status , Patient's Chart, lab work & pertinent test results  History of Anesthesia Complications Negative for: history of anesthetic complications  Airway Mallampati: II  TM Distance: >3 FB Neck ROM: Full    Dental  (+) Poor Dentition   Pulmonary neg sleep apnea, neg COPD, Patient abstained from smoking., former smoker,    breath sounds clear to auscultation- rhonchi (-) wheezing      Cardiovascular Exercise Tolerance: Good (-) hypertension(-) CAD, (-) Past MI, (-) Cardiac Stents and (-) CABG  Rhythm:Regular Rate:Normal - Systolic murmurs and - Diastolic murmurs    Neuro/Psych neg Seizures PSYCHIATRIC DISORDERS Anxiety negative neurological ROS     GI/Hepatic GERD  ,(+) Cirrhosis       , Hepatitis -, C  Endo/Other  negative endocrine ROSneg diabetes  Renal/GU negative Renal ROS     Musculoskeletal negative musculoskeletal ROS (+)   Abdominal (+) + obese,   Peds  Hematology negative hematology ROS (+)   Anesthesia Other Findings Past Medical History: No date: Cirrhosis (HCC) No date: Diverticulosis No date: Dyslipidemia No date: GERD (gastroesophageal reflux disease) No date: H/O ileostomy No date: Hepatitis C     Comment:  s/p treatment No date: Hypercholesteremia No date: Obesity No date: PCOS (polycystic ovarian syndrome) No date: PTSD (post-traumatic stress disorder) No date: Steatosis   Reproductive/Obstetrics                            Anesthesia Physical Anesthesia Plan  ASA: II  Anesthesia Plan: General   Post-op Pain Management:  Regional for Post-op pain   Induction: Intravenous  PONV Risk Score and Plan: 2 and Ondansetron, Midazolam and Dexamethasone  Airway Management Planned: Oral ETT  Additional Equipment:   Intra-op Plan:    Post-operative Plan: Extubation in OR  Informed Consent: I have reviewed the patients History and Physical, chart, labs and discussed the procedure including the risks, benefits and alternatives for the proposed anesthesia with the patient or authorized representative who has indicated his/her understanding and acceptance.     Dental advisory given  Plan Discussed with: CRNA and Anesthesiologist  Anesthesia Plan Comments:         Anesthesia Quick Evaluation

## 2020-04-20 NOTE — Transfer of Care (Signed)
Immediate Anesthesia Transfer of Care Note  Patient: Megan [name]  Procedure(s) Performed: LEFT SHOULDER ARTHROSCOPY WITH LABRAL REPAIR, SUBACROMIAL DEBRIDEMENT (Left Shoulder)  Patient Location: PACU  Anesthesia Type:General and GA combined with regional for post-op pain  Level of Consciousness: awake, alert  and oriented  Airway & Oxygen Therapy: Patient Spontanous Breathing and Patient connected to face mask oxygen  Post-op Assessment: Report given to RN and Post -op Vital signs reviewed and stable  Post vital signs: Reviewed and stable  Last Vitals:  Vitals Value Taken Time  BP 111/71 04/20/20 1334  Temp    Pulse 81 04/20/20 1339  Resp 22 04/20/20 1339  SpO2 100 % 04/20/20 1339  Vitals shown include unvalidated device data.  Last Pain:  Vitals:   04/20/20 0840  TempSrc: Tympanic  PainSc: 9          Complications: No complications documented.

## 2020-04-20 NOTE — H&P (Signed)
PREOPERATIVE H&P  Chief Complaint: Left Shoulder near circumferential labral tear  HPI: Megan [name] is a 40 y.o. female who presents for preoperative history and physical with a diagnosis of Left Shoulder near circumferential labral. Symptoms of pain, instability and limited range of motion are significantly impairing activities of daily living.  MRI of the left shoulder demonstrates a near circumferential labral tear.  She has failed nonoperative management and wished to proceed with surgical fixation of her labral tear.   Past Medical History:  Diagnosis Date  . Cirrhosis (HCC)   . Diverticulosis   . Dyslipidemia   . GERD (gastroesophageal reflux disease)   . H/O ileostomy   . Hepatitis C    s/p treatment  . Hypercholesteremia   . Obesity   . PCOS (polycystic ovarian syndrome)   . PTSD (post-traumatic stress disorder)   . Steatosis    Past Surgical History:  Procedure Laterality Date  . COLON SURGERY    . FRACTURE SURGERY     left hand  . HAND SURGERY Left   . KNEE ARTHROSCOPY Right   . LIVER BIOPSY    . LUMBAR LAMINECTOMY/DECOMPRESSION MICRODISCECTOMY N/A 07/15/2018   Procedure: L5-S1 MINIMALLY INVASIVE LUMBAR DISCECTOMY;  Surgeon: Jadene Pierini, MD;  Location: [facility] OR;  Service: Neurosurgery;  Laterality: N/A;  . TONSILLECTOMY     Social History   Socioeconomic History  . Marital status: Single    Spouse name: Not on file  . Number of children: 0  . Years of education: Not on file  . Highest education level: Not on file  Occupational History  . Occupation: Bartender  Tobacco Use  . Smoking status: Former Smoker    Packs/day: 0.50    Years: 15.00    Pack years: 7.50    Types: Cigarettes    Quit date: 03/30/2015    Years since quitting: 5.0  . Smokeless tobacco: Never Used  Vaping Use  . Vaping Use: Never used  Substance and Sexual Activity  . Alcohol use: Yes    Comment: social  . Drug use: No  . Sexual activity: Not on file  Other Topics Concern  .  Not on file  Social History Narrative   ** Merged History Encounter **    room mate   Social Determinants of Health   Financial Resource Strain: Not on file  Food Insecurity: Not on file  Transportation Needs: Not on file  Physical Activity: Not on file  Stress: Not on file  Social Connections: Not on file   Family History  Problem Relation Age of Onset  . High blood pressure Mother   . Heart attack Father   . High blood pressure Father   . Heart attack Paternal Grandfather        57s  . Colon cancer Maternal Grandfather   . Breast cancer Maternal Aunt     Prior to Admission medications   Medication Sig Start Date End Date Taking? Authorizing Provider  atorvastatin (LIPITOR) 40 MG tablet Take 40 mg by mouth daily.   Yes [provider]  cyclobenzaprine (FLEXERIL) 5 MG tablet Take 1 tablet (5 mg total) by mouth 3 (three) times daily as needed (muscle pain/aches). 02/08/20  Yes Phineas Semen, MD  diazepam (VALIUM) 5 MG tablet Take 5 mg by mouth daily as needed for anxiety. 06/25/19  Yes [provider]  hydrOXYzine (VISTARIL) 50 MG capsule Take 50 mg by mouth 3 (three) times daily as needed for itching.   Yes [provider]  Netarsudil-Latanoprost (ROCKLATAN) 0.02-0.005 % SOLN Place 1 drop into both eyes at bedtime.   Yes [provider]  omeprazole (PRILOSEC) 20 MG capsule Take 20 mg by mouth daily.    Yes [provider]  timolol (TIMOPTIC) 0.5 % ophthalmic solution Place 1 drop into both eyes 2 (two) times daily. 12/16/18  Yes [provider]  elvitegravir-cobicistat-emtricitabine-tenofovir (GENVOYA) 150-150-200-10 MG TABS tablet Take 1 tablet by mouth daily with breakfast. Patient not taking: No sig reported 09/25/17   Willy Eddy, MD     Positive ROS: All other systems have been reviewed and were otherwise negative with the exception of those mentioned in the HPI and as above.  Physical Exam: General: Alert, no  acute distress Cardiovascular: Regular rate and rhythm, no murmurs rubs or gallops.  No pedal edema Respiratory: Clear to auscultation bilaterally, no wheezes rales or rhonchi. No cyanosis, no use of accessory musculature GI: No organomegaly, abdomen is soft and non-tender nondistended with positive bowel sounds. Skin: Skin intact, no lesions within the operative field. Neurologic: Sensation intact distally Psychiatric: Patient is competent for consent with normal mood and affect Lymphatic: No cervical lymphadenopathy  MUSCULOSKELETAL: Left shoulder: Patient has pain with overhead and abduction activity beyond 90 degrees.  She has positive apprehension but no overt instability on exam.  Patient is full digital reasonable range of motion, intact sensation to touch and a palpable radial pulse.  She is pain with impingement testing.  She has no weakness with manual strength testing the rotator cuff.  Assessment: Left Shoulder near circumferential labral tear  Plan: Plan for Procedure(s): LEFT SHOULDER ARTHROSCOPIC LABRAL REPAIR  I reviewed the patient's MRI and laboratory studies.  I have recommended arthroscopic labral repair given her persistence of pain and failure to respond to nonoperative management.  Reviewed the details of the operation as well as the postoperative course.  I discussed the risks and benefits of surgery. The risks include but are not limited to infection, bleeding, nerve or blood vessel injury, joint stiffness or loss of motion, persistent pain, weakness or instability, retear of the labrum, failure of the repair and the need for further surgery. Medical risks include but are not limited to DVT and pulmonary embolism, myocardial infarction, stroke, pneumonia, respiratory failure and death. Patient understood these risks and wished to proceed.    Juanell Fairly, MD   04/20/2020 10:07 AM

## 2020-04-20 NOTE — Op Note (Signed)
04/20/2020  1:55 PM  PATIENT:  Megan [name]  40 y.o. female  PRE-OPERATIVE DIAGNOSIS:  Left Shoulder near circumferential labral tear  POST-OPERATIVE DIAGNOSIS: Left shoulder near circumferential labral tear with subacromial bursitis  PROCEDURE:  Procedure(s): LEFT SHOULDER ARTHROSCOPY WITH LABRAL REPAIR, SUBACROMIAL DEBRIDEMENT (Left)  SURGEON:  Surgeon(s) and Role:    Thornton Park, MD - Primary  ANESTHESIA:   general and paracervical block   PREOPERATIVE INDICATIONS:  Megan [name] is a  40 y.o. female with a diagnosis of near circumferential left shoulder labral tear, confirmed by MR arthrogram, who failed conservative measures and elected for surgical excision.    I discussed the risks and benefits of surgery. The risks include but are not limited to infection, bleeding, nerve or blood vessel injury, joint stiffness or loss of motion, persistent pain, weakness or instability, and hardware failure and the need for further surgery. Medical risks include but are not limited to DVT and pulmonary embolism, myocardial infarction, stroke, pneumonia, respiratory failure and death. Patient understood these risks and wished to proceed.   OPERATIVE IMPLANTS: Arthrex bio suturetak anchors  5   OPERATIVE FINDINGS: Left shoulder shoulder complete anterior labral tear extending to the inferior posterior labrum. Subacromial bursitis.  OPERATIVE PROCEDURE:  I met with the patient preoperative area.  A pre-op history and physical was performed.  I signed the left shoulder according the hospital's correct site of surgery protocol.  I answered all questions by the patient. An interscalene block with Exparel was performed by the anesthesia service in the preoperative area.  Patient was then brought to the operating room and underwent general endotracheal intubation. She was then positioned in a beachchair position using a spider arm positioner. All bony prominences were adequately padded including the  lower extremities.  Examination under anesthesia revealed increased anterior and posterior translation with load and shift testing without frank dislocation.  The patient was then prepped and draped in a sterile fashion. The patient received 2 g of Ancef prior to the onset of the case.  A timeout was performed to verify the patient's name, date of birth, medical record number, correct site of surgery and correct procedure to be performed.The timeout was also used to verify the patient received antibiotics that all appropriate instruments, implants and radiographic studies were available in the room. Once all in attendance were in agreement case began.  Bony landmarks were drawn out with a surgical marker along with proposed incisions.  An 11 blade was used to establish a posterior portal through which the arthroscope was placed in the glenohumeral joint. An anterior portal was established under direct visualization using an 18-gauge spinal needle for localization. A 5.75 mm arthroscopic cannula was inserted through the anterior portal. A full diagnostic examination of the glenohumeral joint was performed.  Switching sticks were then used to switch the arthroscope to the through the anterior portal. The posterior labrum was found to have a tear involving the anterior inferior quadrant. There is no detachment of the labrum and the superior posterior labrum.  Two posterior lateral portals were placed under direct visualization. An arthroscopic elevator was placed to the inferior of the 2 portals to mobilize the labrum from the osseous glenoid. [place] was placed in the interval between the labrum and glenoid to debride the glenoid until punctate bleeding was identified. An Arthrex bio suture tack anchor was then drilled into the posterior inferior labrum at approximately the 5 o'clock position. A 90 degree suture lasso was then used to shuttle a  single limb of this bio suture tack anchor under the labrum. An  arthroscopic knot tying technique was used to then approximate the posterior inferior labrum to the osseous glenoid. Posterior labrum was then probed and found to be stable. Switching sticks were then used to examine placed the arthroscope through the posterior portal.   A 90 degree ArthroCare wand was then used to debride scar tissue within the rotator interval. There was no tear of the subscapularis or biceps.  An anterolateral portal was established again under direct visualization using an 18-gauge spinal needle. A 7 mm cannula was placed through this anterolateral portal. An arthroscopic elevator was used to mobilize the anterior labrum. A 4.0 mm resector shaver blade was used to debride the interval between the labrum and osseous glenoid until punctate bleeding was identified. Three Arthrex Bio Suturetak anchors were then placed sequentially beginning at the 7 o'clock position and then at the 8:00 and 9:00 positions on the glenoid. A 90 degree Arthrex suture lasso was then used to shuttle a single limb of each anchor under the anterior labrum/capsule complex.  Again, an arthroscopic knot tying technique was then used to approximate the anterior labrum to the glenoid with each of these sutures. After the capsulorrhaphy was completed a hook probe was used to confirm stability of the repair. A fourth Arthrex bio suture tack anchor was then placed just anterior to the biceps insertion after the glenoid was debrided.  Again, a 90 degree suture lasso was used to pass a single limb of this anchor under the anterior superior labrum. An arthroscopic knot tying technique was used to stabilize anterosuperior labrum and biceps anchor.  Final arthroscopic images were taken of the labral repair.  The arthroscope was then placed into the subacromial space. A lateral portal was established to allow for extensive bursectomy the subacromial space. Patient did not have subacromial spurs or significant glenohumeral joint  arthrosis necessitating subacromial decompression or distal clavicle excision. On completion of the subacromial debridement, the arthroscope was removed after final images were taken.  The arthroscopic portals were closed with 4-0 nylon. A dry, sterile dressing was applied to the left shoulder, along with a Polar Care sleeve. Patient's left arm was then placed in an abduction sling. She was awoken and brought to PACU in stable condition. I was scrubbed and present for the entire case and all sharp and instrument counts were correct at the conclusion the case. I spoke with the patient's mother in the postop consultation room to let her know the operation was successful and performed without complication. I reviewed postop instructions with her and answered all her questions.    Timoteo Gaul, MD

## 2020-04-20 NOTE — Discharge Instructions (Addendum)
Interscalene Nerve Block with Exparel  1.  For your surgery you have received an Interscalene Nerve Block with Exparel. 2. Nerve Blocks affect many types of nerves, including nerves that control movement, pain and normal sensation.  You may experience feelings such as numbness, tingling, heaviness, weakness or the inability to move your arm or the feeling or sensation that your arm has "fallen asleep". 3. A nerve block with Exparel can last up to 5 days.  Usually the weakness wears off first.  The tingling and heaviness usually wear off next.  Finally you may start to notice pain.  Keep in mind that this may occur in any order.  Once a nerve block starts to wear off it is usually completely gone within 60 minutes. 4. ISNB may cause mild shortness of breath, a hoarse voice, blurry vision, unequal pupils, or drooping of the face on the same side as the nerve block.  These symptoms will usually resolve with the numbness.  Very rarely the procedure itself can cause mild seizures. 5. If needed, your surgeon will give you a prescription for pain medication.  It will take about 60 minutes for the oral pain medication to become fully effective.  So, it is recommended that you start taking this medication before the nerve block first begins to wear off, or when you first begin to feel discomfort. 6. Take your pain medication only as prescribed.  Pain medication can cause sedation and decrease your breathing if you take more than you need for the level of pain that you have. 7. Nausea is a common side effect of many pain medications.  You may want to eat something before taking your pain medicine to prevent nausea. 8. After an Interscalene nerve block, you cannot feel pain, pressure or extremes in temperature in the effected arm.  Because your arm is numb it is at an increased risk for injury.  To decrease the possibility of injury, please practice the following:  a. While you are awake change the position of  your arm frequently to prevent too much pressure on any one area for prolonged periods of time. b.  If you have a cast or tight dressing, check the color or your fingers every couple of hours.  Call your surgeon with the appearance of any discoloration (white or blue). c. If you are given a sling to wear before you go home, please wear it  at all times until the block has completely worn off.  Do not get up at night without your sling. d. Please contact [facility] or your surgeon if you do not begin to regain sensation after 7 days from the surgery.  Anesthesia may be contacted by calling the Same Day Surgery Department, Mon. through Fri., 6 am to 4 pm at [phone].   e. If you experience any other problems or concerns, please contact your surgeon's office. If you experience severe or prolonged shortness of breath go to the nearest emergency department  .AMBULATORY SURGERY  DISCHARGE INSTRUCTIONS   1) The drugs that you were given will stay in your system until tomorrow so for the next 24 hours you should not:  A) Drive an automobile B) Make any legal decisions C) Drink any alcoholic beverage   2) You may resume regular meals tomorrow.  Today it is better to start with liquids and gradually work up to solid foods.  You may eat anything you prefer, but it is better to start with liquids, then soup and crackers,  and gradually work up to solid foods.   3) Please notify your doctor immediately if you have any unusual bleeding, trouble breathing, redness and pain at the surgery site, drainage, fever, or pain not relieved by medication.    4) Additional Instructions:  Keep green band on for 4 days  Please contact your physician with any problems or Same Day Surgery at [phone], Monday through Friday 6 am to 4 pm, or [place] at [facility] number at [phone].

## 2020-04-20 NOTE — Anesthesia Postprocedure Evaluation (Signed)
Anesthesia Post Note  Patient: [name]  Procedure(s) Performed: LEFT SHOULDER ARTHROSCOPY WITH LABRAL REPAIR, SUBACROMIAL DEBRIDEMENT (Left Shoulder)  Patient location during evaluation: PACU Anesthesia Type: General Level of consciousness: awake and alert and oriented Pain management: pain level controlled Vital Signs Assessment: post-procedure vital signs reviewed and stable Respiratory status: spontaneous breathing, nonlabored ventilation and respiratory function stable Cardiovascular status: blood pressure returned to baseline and stable Postop Assessment: no signs of nausea or vomiting Anesthetic complications: no   No complications documented.   Last Vitals:  Vitals:   04/20/20 1442 04/20/20 1511  BP: 118/71 119/76  Pulse: 68 72  Resp: 16 16  Temp: 36.7 C   SpO2: 99% 100%    Last Pain:  Vitals:   04/20/20 1511  TempSrc:   PainSc: 0-No pain                 Moustafa Mossa

## 2020-04-20 NOTE — Anesthesia Procedure Notes (Signed)
Procedure Name: Intubation Performed by: Gilford Raid, CRNA Pre-anesthesia Checklist: Patient identified, Patient being monitored, Timeout performed, Emergency Drugs available and Suction available Patient Re-evaluated:Patient Re-evaluated prior to induction Oxygen Delivery Method: Circle system utilized Preoxygenation: Pre-oxygenation with 100% oxygen Induction Type: IV induction Ventilation: Mask ventilation without difficulty Laryngoscope Size: Miller and 2 Grade View: Grade I Tube type: Oral Tube size: 7.0 mm Number of attempts: 1 Airway Equipment and Method: Stylet Placement Confirmation: ETT inserted through vocal cords under direct vision,  positive ETCO2 and breath sounds checked- equal and bilateral Secured at: 21 cm Tube secured with: Tape Dental Injury: Teeth and Oropharynx as per pre-operative assessment

## 2020-04-20 NOTE — Anesthesia Procedure Notes (Signed)
Anesthesia Regional Block: Interscalene brachial plexus block   Pre-Anesthetic Checklist: ,, timeout performed, Correct Patient, Correct Site, Correct Laterality, Correct Procedure, Correct Position, site marked, Risks and benefits discussed,  Surgical consent,  Pre-op evaluation,  At surgeon's request and post-op pain management  Laterality: Left  Prep: chloraprep       Needles:  Injection technique: Single-shot  Needle Type: Stimiplex     Needle Length: 10cm  Needle Gauge: 21     Additional Needles:   Procedures:,,,, ultrasound used (permanent image in chart),,,,  Narrative:  Start time: 04/20/2020 9:13 AM End time: 04/20/2020 9:16 AM Injection made incrementally with aspirations every 5 mL.  Performed by: Personally  Anesthesiologist: Alver Fisher, MD  Additional Notes: Functioning IV was confirmed and monitors were applied.  A Stimuplex needle was used. Sterile prep and drape,hand hygiene and sterile gloves were used.  Negative aspiration and negative test dose prior to incremental administration of local anesthetic. The patient tolerated the procedure well.

## 2020-04-21 ENCOUNTER — Encounter: Payer: Self-pay | Admitting: Orthopedic Surgery

## 2020-05-03 ENCOUNTER — Other Ambulatory Visit: Payer: Self-pay | Admitting: Neurological Surgery

## 2020-05-03 DIAGNOSIS — M542 Cervicalgia: Secondary | ICD-10-CM

## 2020-05-22 DIAGNOSIS — K7460 Unspecified cirrhosis of liver: Secondary | ICD-10-CM

## 2020-05-22 DIAGNOSIS — K760 Fatty (change of) liver, not elsewhere classified: Secondary | ICD-10-CM | POA: Insufficient documentation

## 2020-05-22 HISTORY — DX: Unspecified cirrhosis of liver: K74.60

## 2020-05-22 HISTORY — DX: Fatty (change of) liver, not elsewhere classified: K76.0

## 2020-05-23 ENCOUNTER — Other Ambulatory Visit: Payer: Self-pay

## 2020-05-23 ENCOUNTER — Ambulatory Visit
Admission: RE | Admit: 2020-05-23 | Discharge: 2020-05-23 | Disposition: A | Discharge status: Home / Self Care | Payer: Medicaid Other | Source: Ambulatory Visit | Attending: Neurological Surgery | Admitting: Neurological Surgery

## 2020-05-23 ENCOUNTER — Other Ambulatory Visit: Payer: Medicaid Other

## 2020-05-23 DIAGNOSIS — M542 Cervicalgia: Secondary | ICD-10-CM
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
## 2020-05-25 ENCOUNTER — Other Ambulatory Visit (HOSPITAL_COMMUNITY): Payer: Self-pay | Admitting: Family Medicine

## 2020-05-25 ENCOUNTER — Other Ambulatory Visit: Payer: Self-pay | Admitting: Orthopedic Surgery

## 2020-05-25 DIAGNOSIS — R2232 Localized swelling, mass and lump, left upper limb: Secondary | ICD-10-CM

## 2020-05-27 ENCOUNTER — Ambulatory Visit (HOSPITAL_BASED_OUTPATIENT_CLINIC_OR_DEPARTMENT_OTHER)
Admission: RE | Admit: 2020-05-27 | Discharge: 2020-05-27 | Disposition: A | Discharge status: Home / Self Care | Payer: Medicaid Other | Source: Ambulatory Visit | Attending: Orthopedic Surgery | Admitting: Orthopedic Surgery

## 2020-05-27 ENCOUNTER — Ambulatory Visit (HOSPITAL_COMMUNITY)
Admission: RE | Admit: 2020-05-27 | Discharge: 2020-05-27 | Disposition: A | Discharge status: Home / Self Care | Payer: Medicaid Other | Source: Ambulatory Visit | Attending: Orthopedic Surgery | Admitting: Orthopedic Surgery

## 2020-05-27 ENCOUNTER — Encounter (HOSPITAL_COMMUNITY): Payer: Self-pay

## 2020-05-27 ENCOUNTER — Other Ambulatory Visit: Payer: Self-pay

## 2020-05-27 ENCOUNTER — Other Ambulatory Visit (HOSPITAL_COMMUNITY): Payer: Self-pay | Admitting: Orthopedic Surgery

## 2020-05-27 DIAGNOSIS — R2232 Localized swelling, mass and lump, left upper limb: Secondary | ICD-10-CM

## 2020-05-27 NOTE — Progress Notes (Signed)
Left upper ext venous duplex completed    Please see CV Proc for preliminary results.   Clint Guy, RVT

## 2020-05-31 ENCOUNTER — Other Ambulatory Visit: Payer: Self-pay | Admitting: Physician Assistant

## 2020-05-31 DIAGNOSIS — Z1231 Encounter for screening mammogram for malignant neoplasm of breast: Secondary | ICD-10-CM

## 2020-06-01 ENCOUNTER — Other Ambulatory Visit: Payer: Self-pay

## 2020-06-01 ENCOUNTER — Other Ambulatory Visit: Payer: Self-pay | Admitting: Nurse Practitioner

## 2020-06-01 ENCOUNTER — Ambulatory Visit
Admission: RE | Admit: 2020-06-01 | Discharge: 2020-06-01 | Disposition: A | Discharge status: Home / Self Care | Payer: Medicaid Other | Source: Ambulatory Visit | Attending: Physician Assistant | Admitting: Physician Assistant

## 2020-06-01 DIAGNOSIS — Z1231 Encounter for screening mammogram for malignant neoplasm of breast: Secondary | ICD-10-CM

## 2020-06-01 DIAGNOSIS — K7469 Other cirrhosis of liver: Secondary | ICD-10-CM

## 2020-06-01 IMAGING — MG MM DIGITAL SCREENING BILAT W/ TOMO AND CAD
8 series · 8 of 24 positions shown · non-contrast
Comparison: Previous exam(s).

CLINICAL DATA: Screening.

EXAM:
DIGITAL SCREENING BILATERAL MAMMOGRAM WITH TOMOSYNTHESIS AND CAD
TECHNIQUE: Bilateral screening digital craniocaudal and mediolateral oblique
mammograms were obtained. Bilateral screening digital breast
tomosynthesis was performed. The images were evaluated with
computer-aided detection.

[R MLO synth-2D]
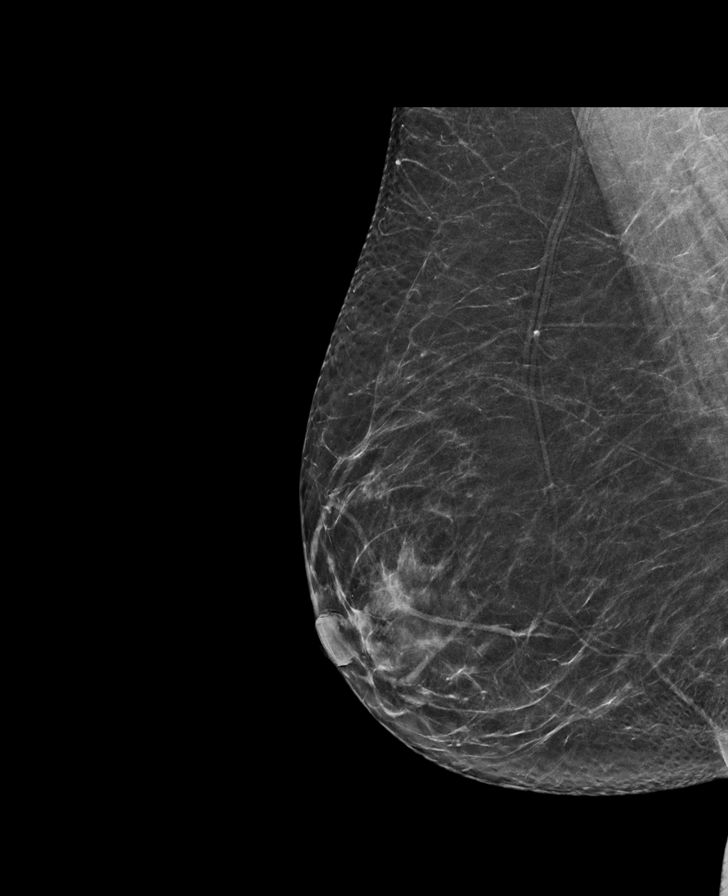

[L MLO synth-2D]
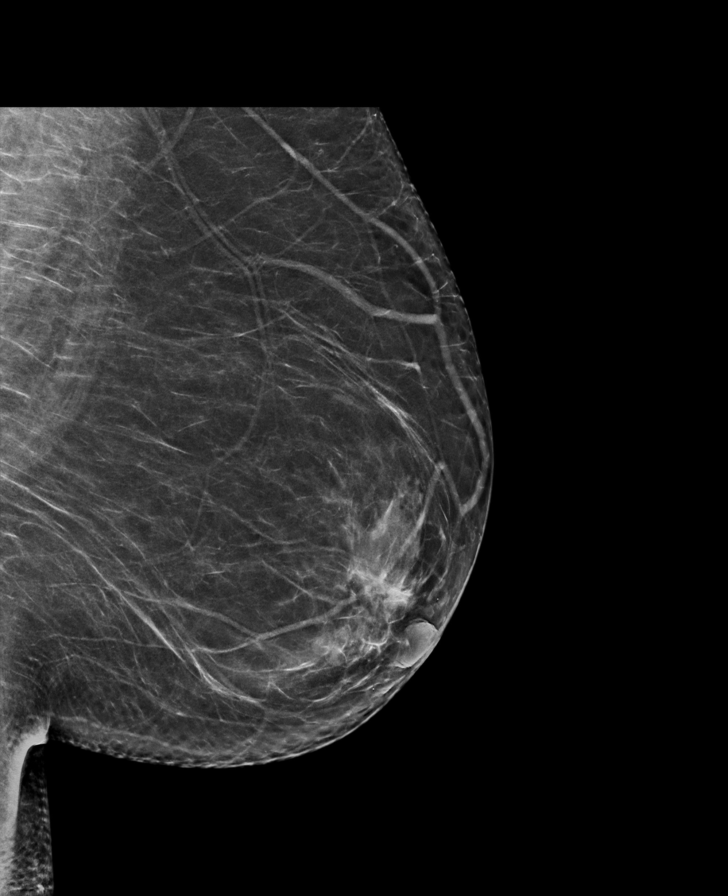

[R CC synth-2D]
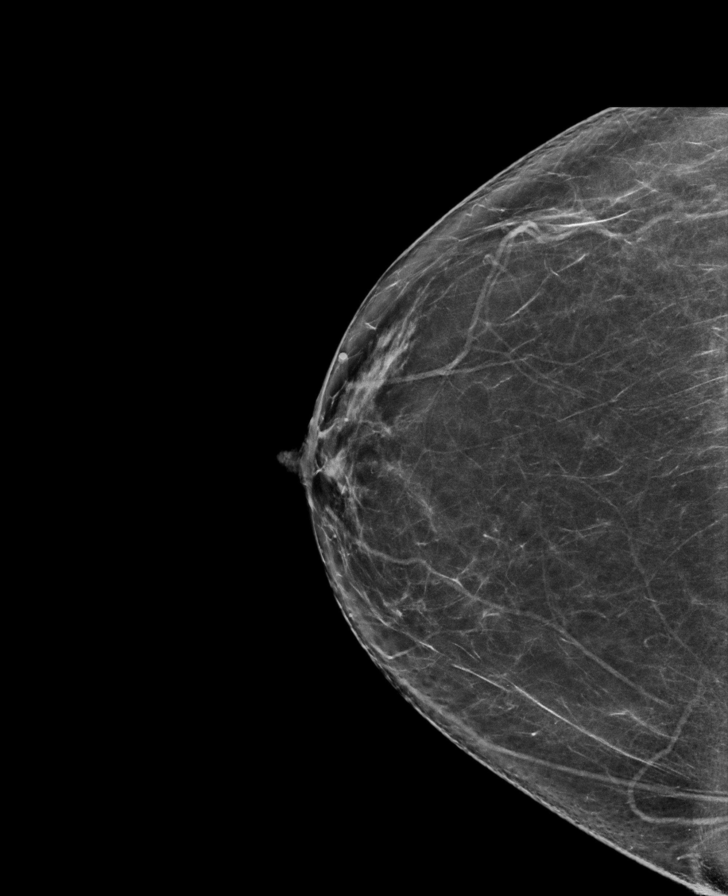

[L CC synth-2D]
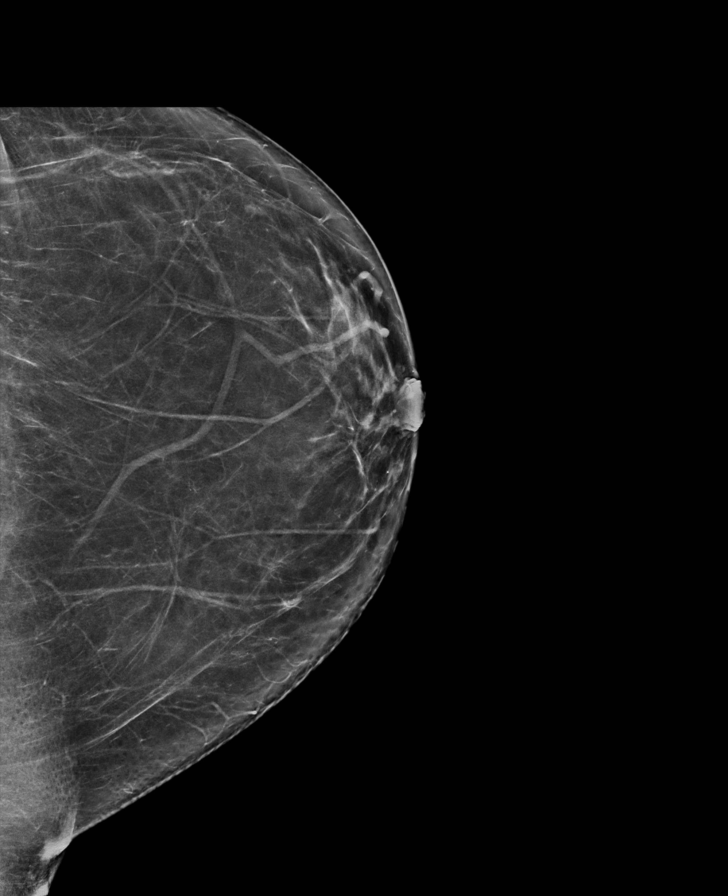

[R MLO tomo · tomo slice 35/70.0]
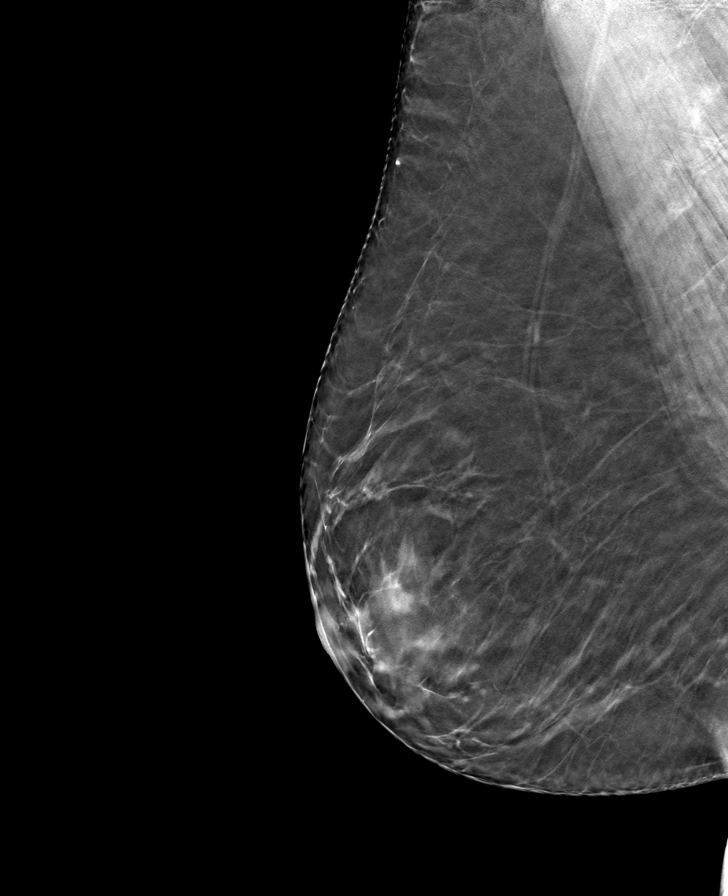

[L MLO tomo · tomo slice 38/75.0]
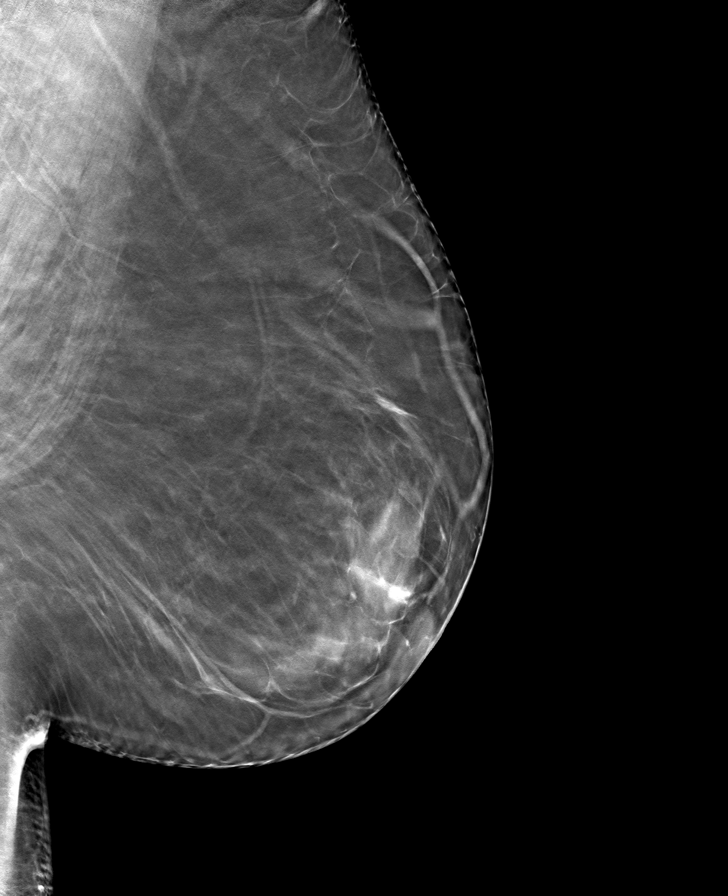

[R CC tomo · tomo slice 38/75.0]
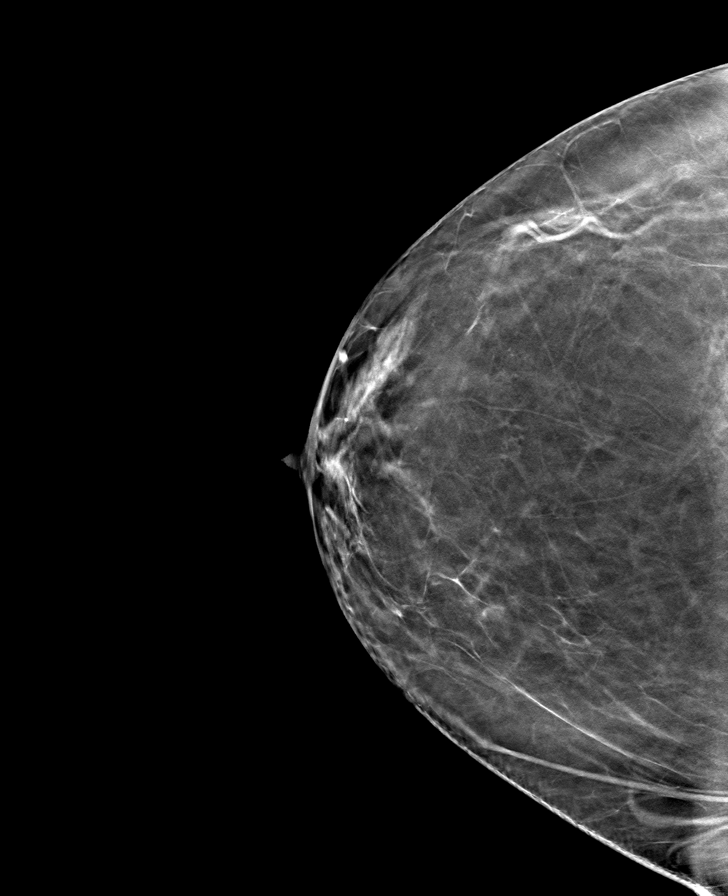

[L CC tomo · tomo slice 39/77.0]
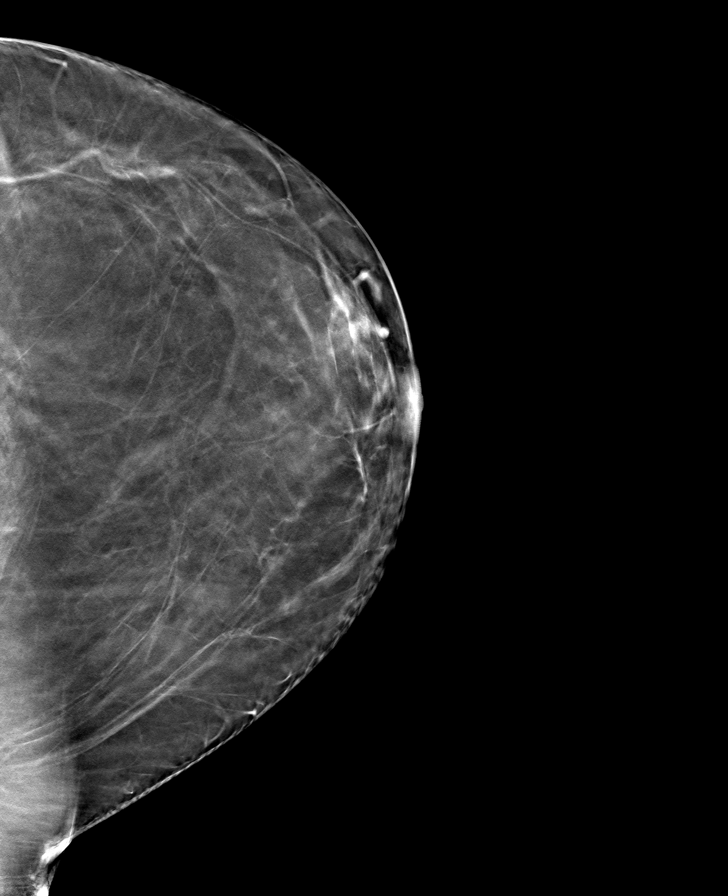

[8 of 24 positions shown; findings below may reference images not displayed]

ACR Breast Density Category b: There are scattered areas of
fibroglandular density.
FINDINGS: There are no findings suspicious for malignancy. The images were
evaluated with computer-aided detection.
IMPRESSION: No mammographic evidence of malignancy. A result letter of this
screening mammogram will be mailed directly to the patient.

RECOMMENDATION:
Screening mammogram in one year. (Code:[OD])

BI-RADS CATEGORY  1: Negative.

## 2020-06-21 ENCOUNTER — Ambulatory Visit
Admission: RE | Admit: 2020-06-21 | Discharge: 2020-06-21 | Disposition: A | Discharge status: Home / Self Care | Payer: Medicaid Other | Source: Ambulatory Visit | Attending: Nurse Practitioner | Admitting: Nurse Practitioner

## 2020-06-21 DIAGNOSIS — K7469 Other cirrhosis of liver: Secondary | ICD-10-CM

## 2020-06-21 IMAGING — US US ABDOMEN LIMITED RUQ/ASCITES
1 series · 14 of 25 positions shown · non-contrast
Comparison: [DATE]

CLINICAL DATA: Cirrhosis history of hep C

EXAM:
ULTRASOUND ABDOMEN LIMITED RIGHT UPPER QUADRANT

[Series 1: us abdomen limited ruq/ascites · 0.18mm/px · 14 of 46 slices shown]
[im 1/46]
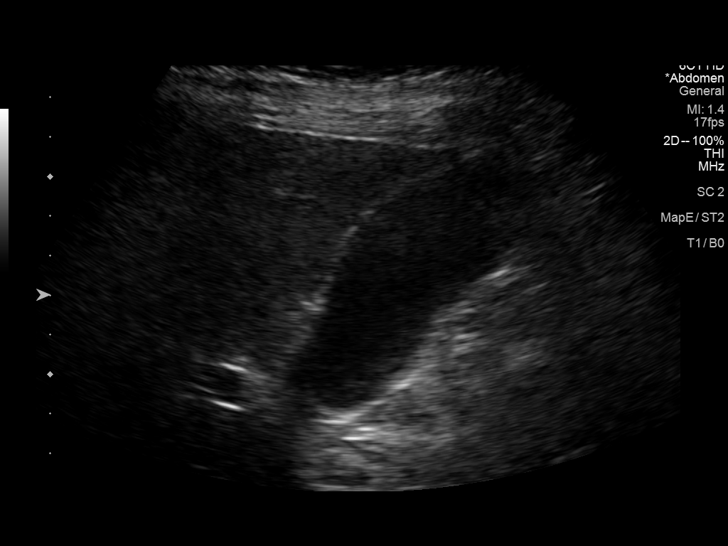
[im 4/46]
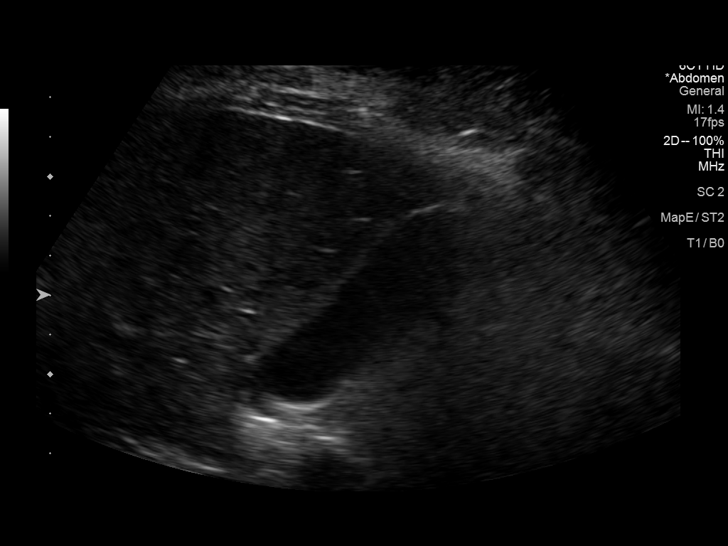
[im 8/46]
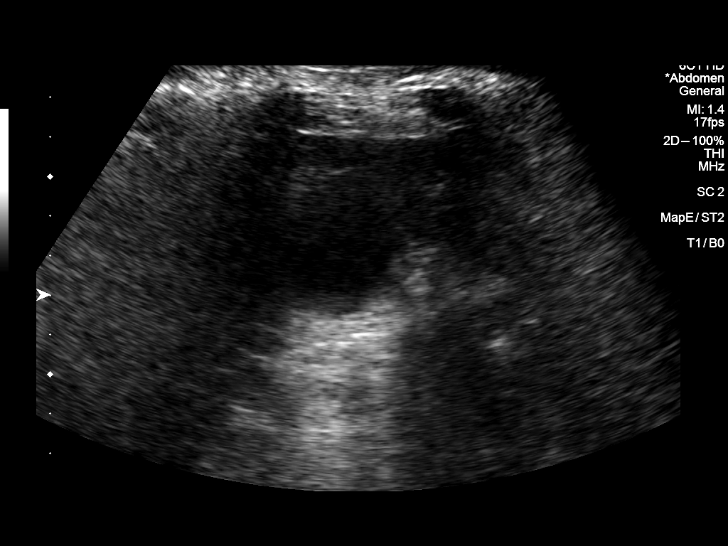
[im 12/46]
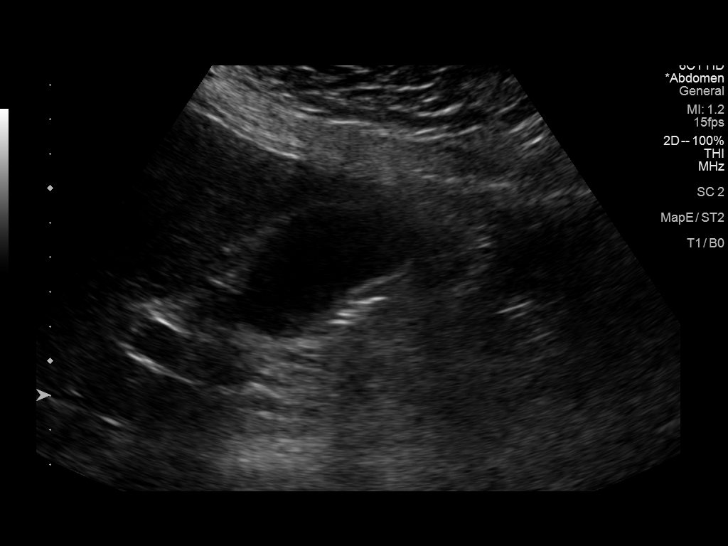
[im 16/46]
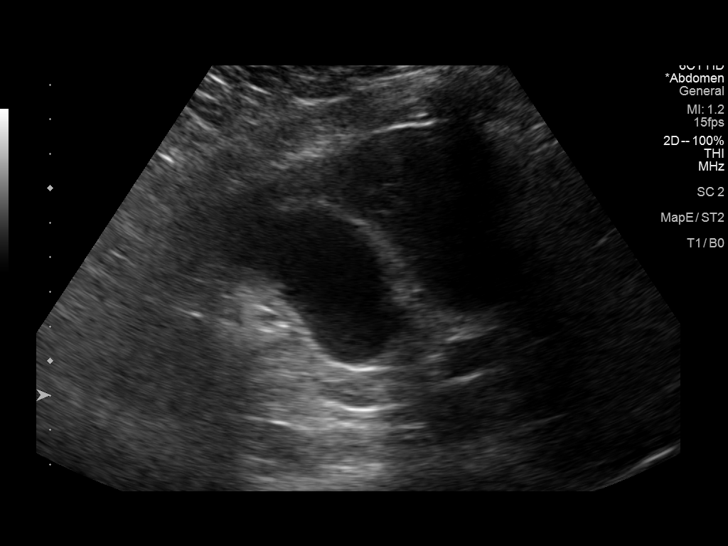
[im 17/46]
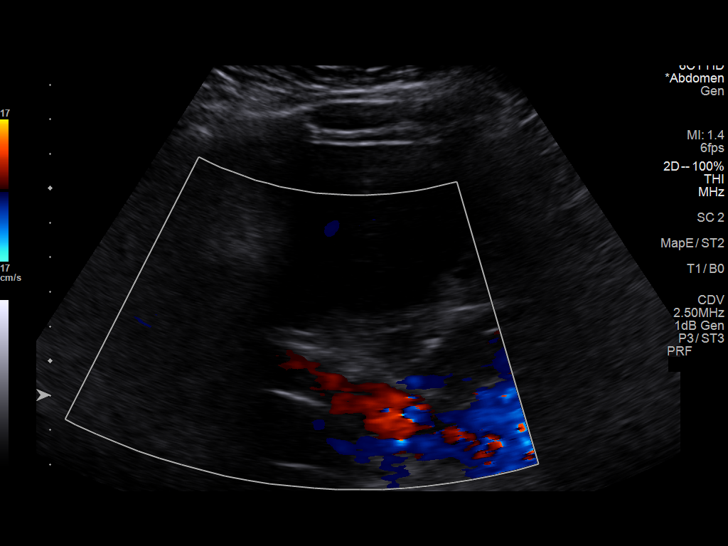
[im 21/46]
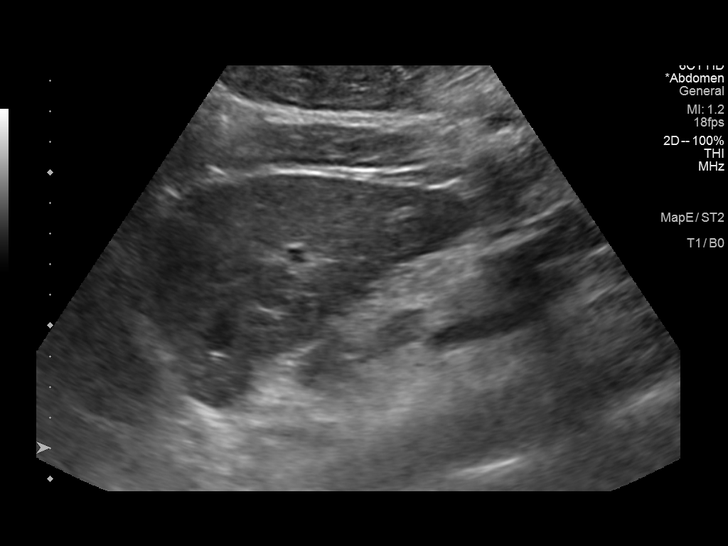
[im 25/46]
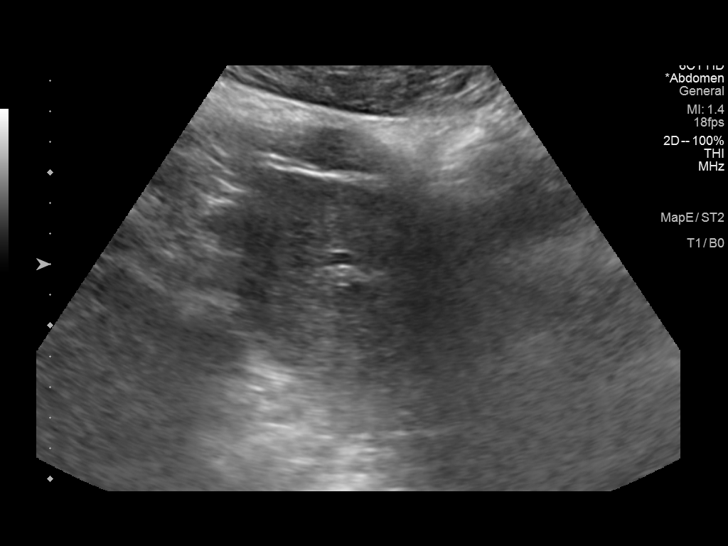
[im 29/46]
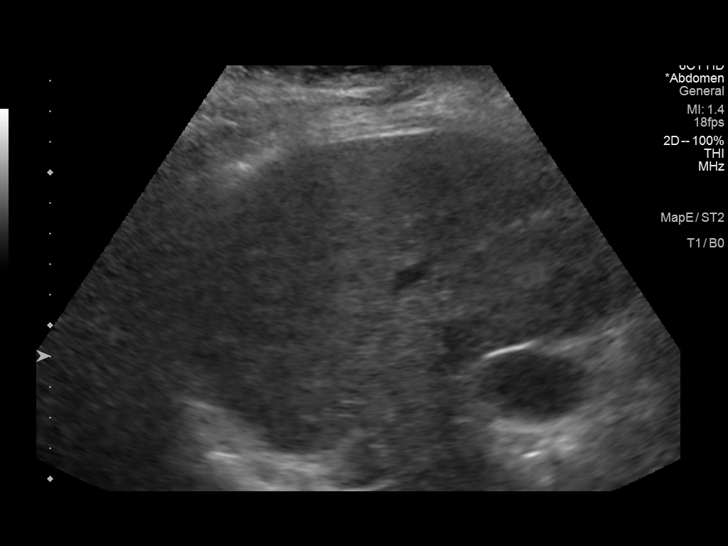
[im 31/46]
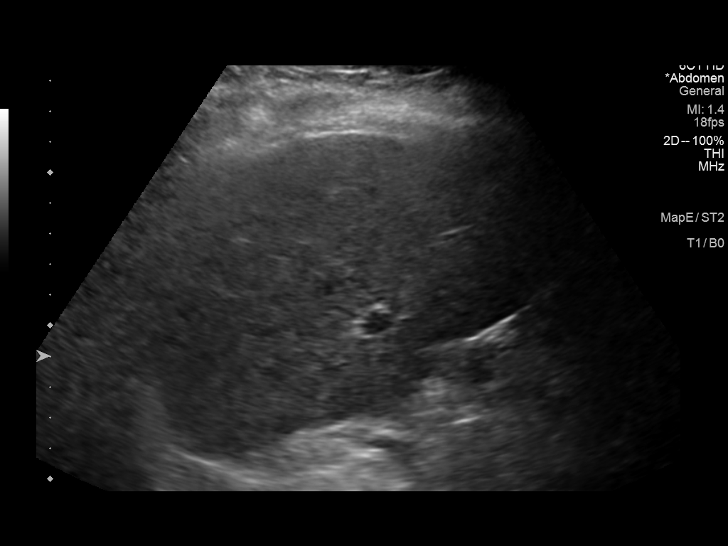
[im 34/46]
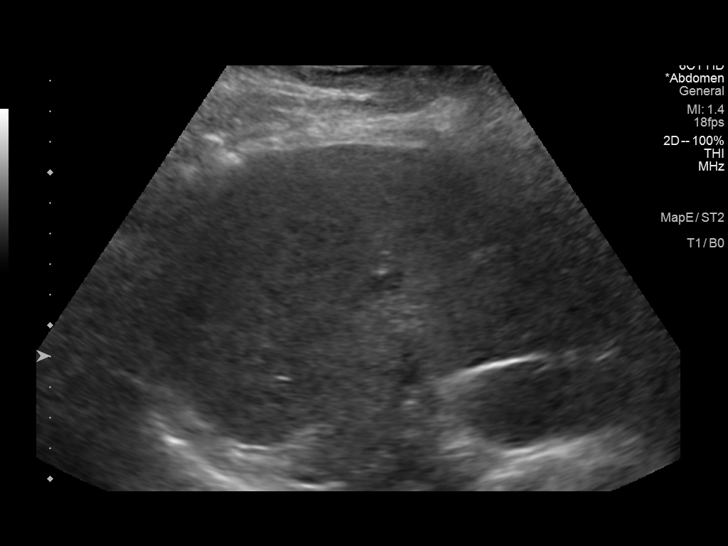
[im 38/46]
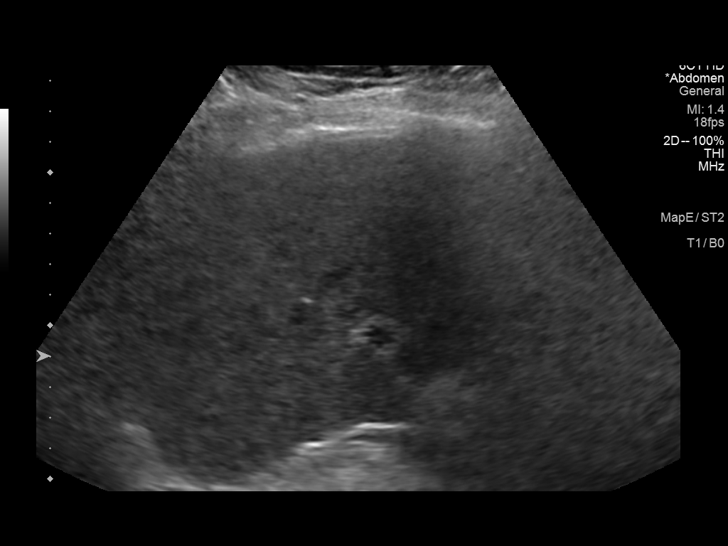
[im 42/46]
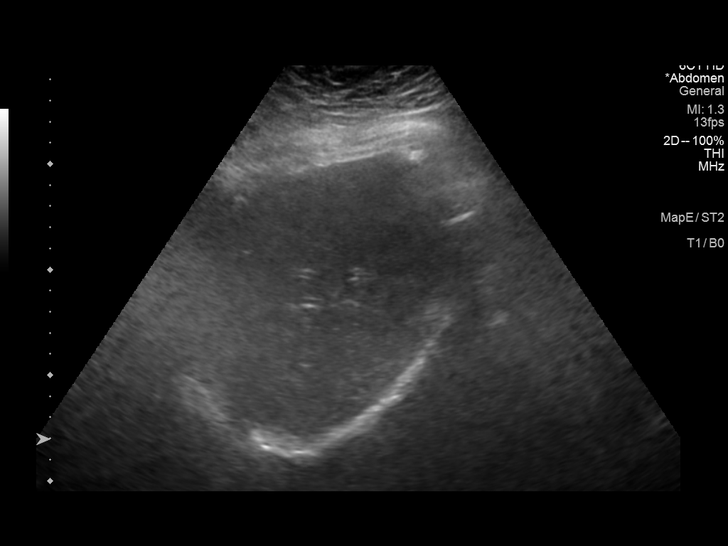
[im 46/46]
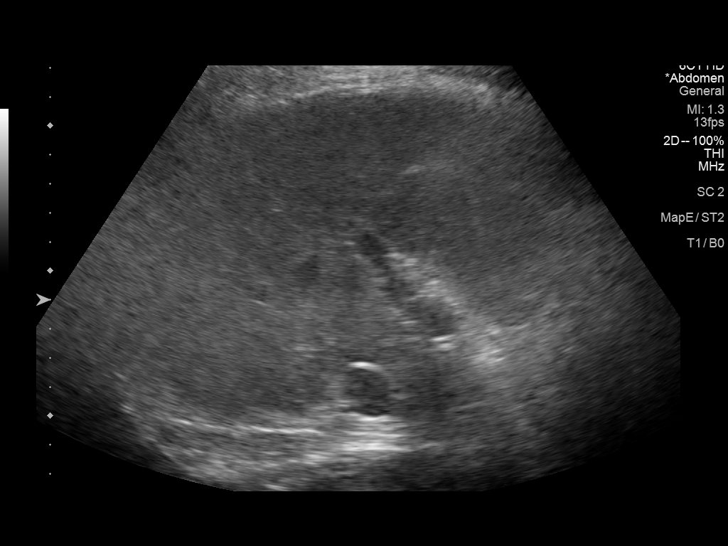

[14 of 25 positions shown; findings below may reference images not displayed]

FINDINGS: Gallbladder:

No gallstones or wall thickening visualized. No sonographic Murphy
sign noted by sonographer.

Common bile duct:

Diameter: 4 mm

Liver:

No focal lesion identified. Diffusely increased hepatic echogenicity
with mild contour nodularity, consistent with given diagnosis of
cirrhosis. Portal vein is patent on color Doppler imaging with
normal direction of blood flow towards the liver.

Other: None.
IMPRESSION: Diffusely increased parenchymal echogenicity with nodular hepatic
contour consistent with given diagnosis of hepatic cirrhosis. No
overt focal hepatic lesion visualized.

## 2020-07-20 ENCOUNTER — Other Ambulatory Visit: Payer: Self-pay | Admitting: Neurological Surgery

## 2020-08-05 NOTE — Pre-Procedure Instructions (Signed)
Surgical Instructions    Your procedure is scheduled on Tuesday, June 21s1.  Report to [facility] Main Entrance "A" at 05:30 A.M., then check in with the Admitting office.  Call this number if you have problems the morning of surgery:  [phone]   If you have any questions prior to your surgery date call [phone]: Open Monday-Friday 8am-4pm    Remember:  Do not eat or drink after midnight the night before your surgery.      Take these medicines the morning of surgery with A SIP OF WATER: atorvastatin (LIPITOR) omeprazole (PRILOSEC) timolol (TIMOPTIC) eye drops  IF NEEDED: cyclobenzaprine (FLEXERIL) diazepam (VALIUM)  ondansetron (ZOFRAN)   As of today, STOP taking any Aspirin (unless otherwise instructed by your surgeon), NSAIDs such as celecoxib (CELEBREX), Aleve, Naproxen, Ibuprofen, Motrin, Advil, Goody's, BC's, all herbal medications, fish oil, and all vitamins.           Do not wear jewelry or makeup Do not wear lotions, powders, perfumes, or deodorant. Do not shave 48 hours prior to surgery.   Do not bring valuables to the hospital. DO Not wear nail polish, gel polish, artificial nails, or any other type of covering on natural nails including finger and toenails. If patients have artificial nails, gel coating, etc. that need to be removed by a nail salon please have this removed prior to surgery or surgery may need to be canceled/delayed if the surgeon/ anesthesia feels like the patient is unable to be adequately monitored.             [facility] is not responsible for any belongings or valuables.  Do NOT Smoke (Tobacco/Vaping) or drink Alcohol 24 hours prior to your procedure. If you use a CPAP at night, you may bring all equipment for your overnight stay.   Contacts, glasses, dentures or bridgework may not be worn into surgery, please bring cases for these belongings   For patients admitted to the hospital, discharge time will be determined by your  treatment team.   Patients discharged the day of surgery will not be allowed to drive home, and someone needs to stay with them for 24 hours.  ONLY 1 SUPPORT PERSON MAY BE PRESENT WHILE YOU ARE IN SURGERY. IF YOU ARE TO BE ADMITTED ONCE YOU ARE IN YOUR ROOM YOU WILL BE ALLOWED TWO (2) VISITORS.  Minor children may have two parents present. Special consideration for safety and communication needs will be reviewed on a case by case basis.  Special instructions:    Oral Hygiene is also important to reduce your risk of infection.  Remember - BRUSH YOUR TEETH THE MORNING OF SURGERY WITH YOUR REGULAR TOOTHPASTE   [facility]- Preparing For Surgery  Before surgery, you can play an important role. Because skin is not sterile, your skin needs to be as free of germs as possible. You can reduce the number of germs on your skin by washing with CHG (chlorahexidine gluconate) Soap before surgery.  CHG is an antiseptic cleaner which kills germs and bonds with the skin to continue killing germs even after washing.     Please do not use if you have an allergy to CHG or antibacterial soaps. If your skin becomes reddened/irritated stop using the CHG.  Do not shave (including legs and underarms) for at least 48 hours prior to first CHG shower. It is OK to shave your face.  Please follow these instructions carefully.     Shower the NIGHT BEFORE SURGERY and the [facility]  OF SURGERY with CHG Soap.   If you chose to wash your hair, wash your hair first as usual with your normal shampoo. After you shampoo, rinse your hair and body thoroughly to remove the shampoo.  Then [employer] and genitals (private parts) with your normal soap and rinse thoroughly to remove soap.  After that Use CHG Soap as you would any other liquid soap. You can apply CHG directly to the skin and wash gently with a scrungie or a clean washcloth.   Apply the CHG Soap to your body ONLY FROM THE NECK DOWN.  Do not use on open wounds or open sores.  Avoid contact with your eyes, ears, mouth and genitals (private parts). Wash Face and genitals (private parts)  with your normal soap.   Wash thoroughly, paying special attention to the area where your surgery will be performed.  Thoroughly rinse your body with warm water from the neck down.  DO NOT shower/wash with your normal soap after using and rinsing off the CHG Soap.  Pat yourself dry with a CLEAN TOWEL.  Wear CLEAN PAJAMAS to bed the night before surgery  Place CLEAN SHEETS on your bed the night before your surgery  DO NOT SLEEP WITH PETS.   Day of Surgery:  Take a shower with CHG soap. Wear Clean/Comfortable clothing the morning of surgery Do not apply any deodorants/lotions.   Remember to brush your teeth WITH YOUR REGULAR TOOTHPASTE.   Please read over the following fact sheets that you were given.

## 2020-08-06 ENCOUNTER — Encounter (HOSPITAL_COMMUNITY)
Admission: RE | Admit: 2020-08-06 | Discharge: 2020-08-06 | Disposition: A | Discharge status: Home / Self Care | Payer: Medicaid Other | Source: Ambulatory Visit | Attending: Neurological Surgery | Admitting: Neurological Surgery

## 2020-08-06 ENCOUNTER — Encounter (HOSPITAL_COMMUNITY): Payer: Self-pay

## 2020-08-06 ENCOUNTER — Other Ambulatory Visit: Payer: Self-pay

## 2020-08-06 DIAGNOSIS — Z01812 Encounter for preprocedural laboratory examination: Secondary | ICD-10-CM | POA: Diagnosis not present

## 2020-08-06 DIAGNOSIS — Z20822 Contact with and (suspected) exposure to covid-19: Secondary | ICD-10-CM | POA: Insufficient documentation

## 2020-08-06 LAB — COMPREHENSIVE METABOLIC PANEL
ALT: 17 U/L (ref 0–44)
AST: 19 U/L (ref 15–41)
Albumin: 3.5 g/dL (ref 3.5–5.0)
Alkaline Phosphatase: 76 U/L (ref 38–126)
Anion gap: 8 (ref 5–15)
BUN: 10 mg/dL (ref 6–20)
CO2: 24 mmol/L (ref 22–32)
Calcium: 8.6 mg/dL — ABNORMAL LOW (ref 8.9–10.3)
Chloride: 106 mmol/L (ref 98–111)
Creatinine, Ser: 0.73 mg/dL (ref 0.44–1.00)
GFR, Estimated: >60 mL/min (ref >60)
Glucose, Bld: 89 mg/dL (ref 70–99)
Potassium: 4 mmol/L (ref 3.5–5.1)
Sodium: 138 mmol/L (ref 135–145)
Total Bilirubin: 0.5 mg/dL (ref 0.3–1.2)
Total Protein: 6.7 g/dL (ref 6.5–8.1)

## 2020-08-06 LAB — CBC
HCT: 38.7 % (ref 36.0–46.0)
Hemoglobin: 11.7 g/dL — ABNORMAL LOW (ref 12.0–15.0)
MCH: 28.7 pg (ref 26.0–34.0)
MCHC: 30.2 g/dL (ref 30.0–36.0)
MCV: 95.1 fL (ref 80.0–100.0)
Platelets: 253 10*3/uL (ref 150–400)
RBC: 4.07 MIL/uL (ref 3.87–5.11)
RDW: 15.3 % (ref 11.5–15.5)
WBC: 7.3 10*3/uL (ref 4.0–10.5)
nRBC: 0 % (ref 0.0–0.2)

## 2020-08-06 LAB — SARS CORONAVIRUS 2 (TAT 6-24 HRS): SARS Coronavirus 2: NEGATIVE

## 2020-08-06 LAB — SURGICAL PCR SCREEN
MRSA, PCR: NEGATIVE
Staphylococcus aureus: NEGATIVE

## 2020-08-06 LAB — TYPE AND SCREEN
ABO/RH(D): O POS
Antibody Screen: NEGATIVE

## 2020-08-06 NOTE — Progress Notes (Signed)
PCP - Lonie Peak, PA-C w/ [facility]; Records requested Cardiologist - Denies  PPM/ICD - Denies  Chest x-ray - N/A EKG - 02/08/20 Stress Test - 04/19/16 ECHO - 03/07/16 Cardiac Cath - Denies  Sleep Study - Denies  Pt is not diabetic.  Blood Thinner Instructions: N/A Aspirin Instructions: N/A  ERAS Protcol - N/A PRE-SURGERY Ensure or G2- N/A  COVID TEST- 08/06/20   Anesthesia review: Yes, review ECHO, Stress test and requested records from PCP- Last office note, labs  Patient denies shortness of breath, fever, cough and chest pain at PAT appointment   All instructions explained to the patient, with a verbal understanding of the material. Patient agrees to go over the instructions while at home for a better understanding. Patient also instructed to self quarantine after being tested for COVID-19. The opportunity to ask questions was provided.

## 2020-08-09 MED ORDER — VANCOMYCIN HCL 1500 MG/300ML IV SOLN
1500.0000 mg | INTRAVENOUS | Status: AC
  Administered 2020-08-10: 1500 mg via INTRAVENOUS
  Filled 2020-08-09 (×3): qty 300

## 2020-08-10 ENCOUNTER — Observation Stay (HOSPITAL_COMMUNITY): Payer: Medicaid Other

## 2020-08-10 ENCOUNTER — Other Ambulatory Visit: Payer: Self-pay

## 2020-08-10 ENCOUNTER — Ambulatory Visit (HOSPITAL_COMMUNITY): Payer: Medicaid Other | Admitting: Anesthesiology

## 2020-08-10 ENCOUNTER — Encounter (HOSPITAL_COMMUNITY): Payer: Self-pay | Admitting: Neurological Surgery

## 2020-08-10 ENCOUNTER — Observation Stay (HOSPITAL_COMMUNITY)
Admission: RE | Admit: 2020-08-10 | Discharge: 2020-08-11 | Disposition: A | Discharge status: Home / Self Care | Payer: Medicaid Other | Attending: Neurological Surgery | Admitting: Neurological Surgery

## 2020-08-10 ENCOUNTER — Encounter (HOSPITAL_COMMUNITY)
Admission: RE | Disposition: A | Discharge status: Home / Self Care | Payer: Self-pay | Source: Home / Self Care | Attending: Neurological Surgery

## 2020-08-10 ENCOUNTER — Ambulatory Visit (HOSPITAL_COMMUNITY): Payer: Medicaid Other

## 2020-08-10 ENCOUNTER — Ambulatory Visit (HOSPITAL_COMMUNITY): Payer: Medicaid Other | Admitting: Physician Assistant

## 2020-08-10 DIAGNOSIS — M5412 Radiculopathy, cervical region: Secondary | ICD-10-CM | POA: Diagnosis present

## 2020-08-10 DIAGNOSIS — Z87891 Personal history of nicotine dependence: Secondary | ICD-10-CM | POA: Insufficient documentation

## 2020-08-10 DIAGNOSIS — M4802 Spinal stenosis, cervical region: Secondary | ICD-10-CM | POA: Insufficient documentation

## 2020-08-10 DIAGNOSIS — M5416 Radiculopathy, lumbar region: Secondary | ICD-10-CM | POA: Diagnosis present

## 2020-08-10 DIAGNOSIS — M50123 Cervical disc disorder at C6-C7 level with radiculopathy: Principal | ICD-10-CM | POA: Insufficient documentation

## 2020-08-10 DIAGNOSIS — Z419 Encounter for procedure for purposes other than remedying health state, unspecified: Secondary | ICD-10-CM

## 2020-08-10 DIAGNOSIS — M79622 Pain in left upper arm: Secondary | ICD-10-CM | POA: Diagnosis present

## 2020-08-10 DIAGNOSIS — I878 Other specified disorders of veins: Secondary | ICD-10-CM

## 2020-08-10 HISTORY — PX: ANTERIOR CERVICAL DECOMP/DISCECTOMY FUSION: SHX1161

## 2020-08-10 LAB — POCT PREGNANCY, URINE: Preg Test, Ur: NEGATIVE

## 2020-08-10 LAB — ABO/RH: ABO/RH(D): O POS

## 2020-08-10 IMAGING — DX DG CHEST 1V PORT
1 series · 1 of 1 positions shown · non-contrast
Comparison: Radiograph [DATE]

CLINICAL DATA: New LEFT IJ line.

EXAM:
PORTABLE CHEST 1 VIEW

[chest]
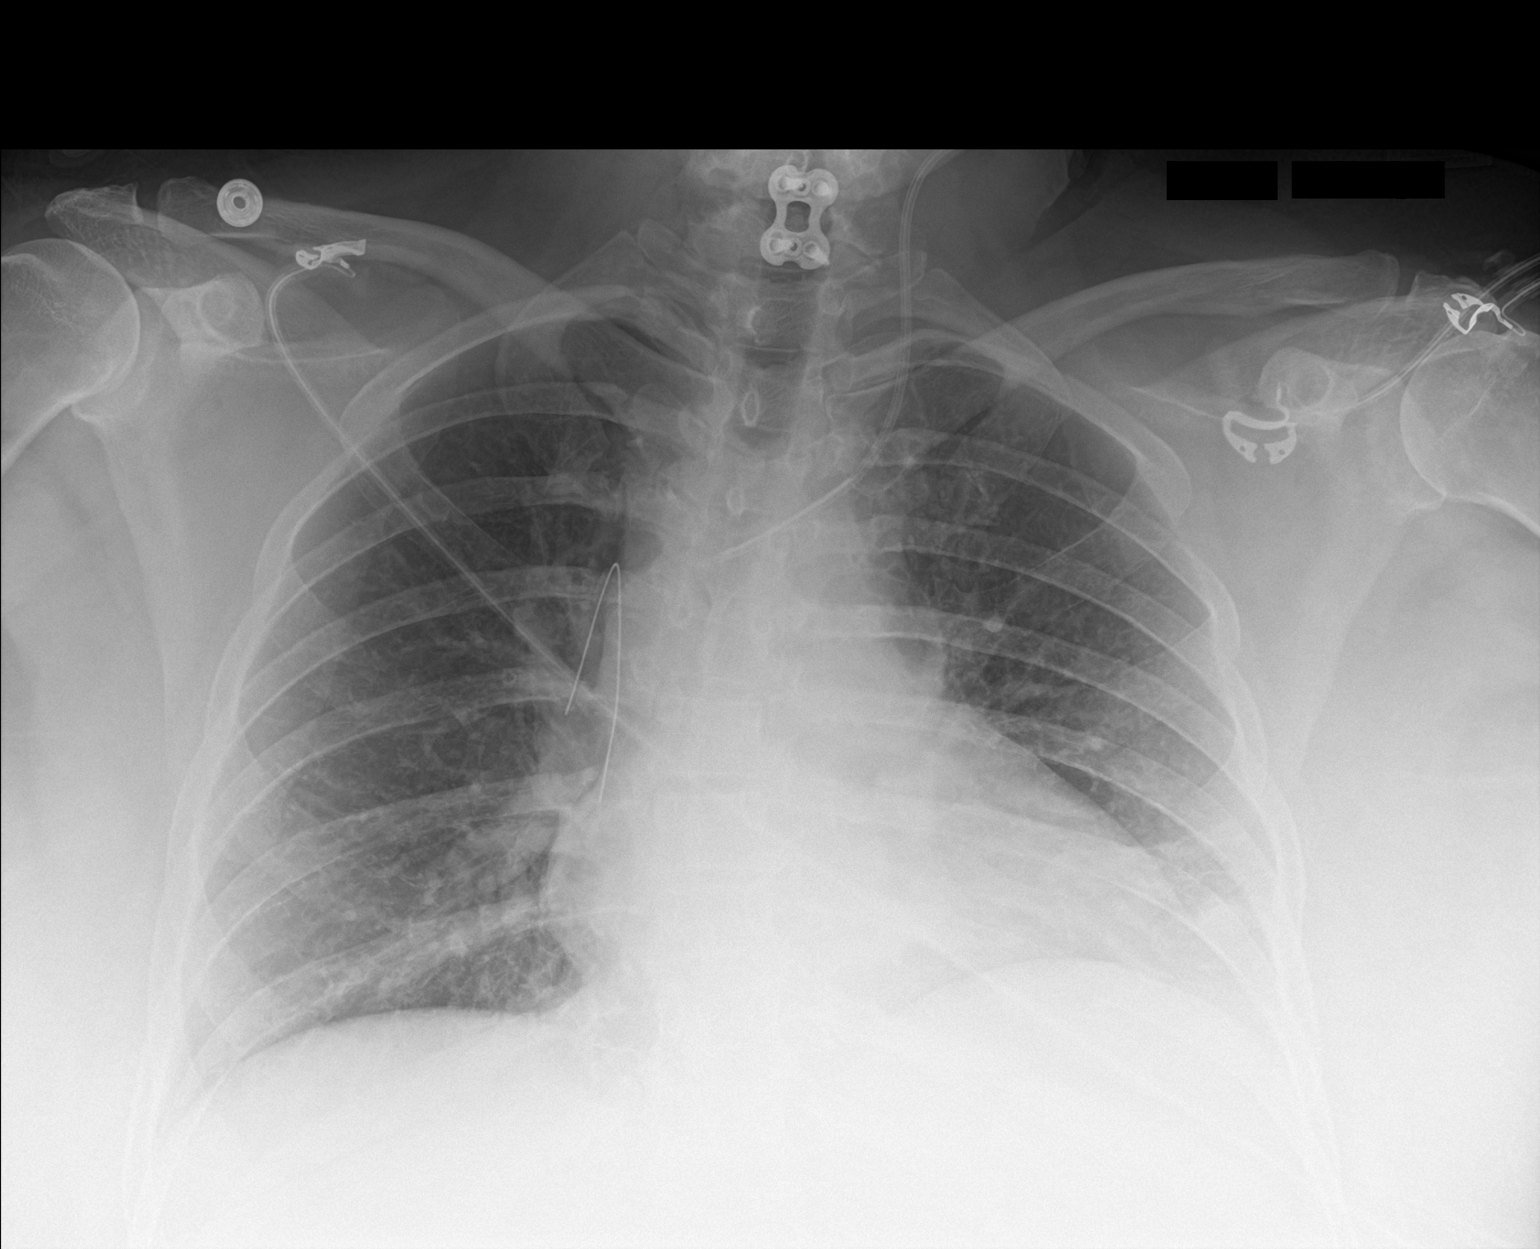

[1 of 1 positions shown; findings below may reference images not displayed]

FINDINGS: Interval placement of central venous line with tip in the distal
brachiocephalic vein. Angled metallic wire projects over the RIGHT
mediastinum.

No pneumothorax. No pulmonary edema or consolidation. Anterior
cervical fusion.a
IMPRESSION: 1. LEFT  IJ line with tip in the distal brachiocephalic vein.
2. Angled wirelike object over the RIGHT mediastinum is presumed
external to patient. Recommend clinical correlation

## 2020-08-10 SURGERY — ANTERIOR CERVICAL DECOMPRESSION/DISCECTOMY FUSION 1 LEVEL
Anesthesia: General

## 2020-08-10 MED ORDER — PHENYLEPHRINE 40 MCG/ML (10ML) SYRINGE FOR IV PUSH (FOR BLOOD PRESSURE SUPPORT)
PREFILLED_SYRINGE | INTRAVENOUS | Status: AC
  Filled 2020-08-10: qty 10

## 2020-08-10 MED ORDER — FENTANYL CITRATE (PF) 250 MCG/5ML IJ SOLN
INTRAMUSCULAR | Status: DC | PRN
  Administered 2020-08-10: 100 ug via INTRAVENOUS
  Administered 2020-08-10 (×4): 50 ug via INTRAVENOUS

## 2020-08-10 MED ORDER — THROMBIN 5000 UNITS EX SOLR
CUTANEOUS | Status: AC
  Filled 2020-08-10: qty 5000

## 2020-08-10 MED ORDER — ATORVASTATIN CALCIUM 40 MG PO TABS
40.0000 mg | ORAL_TABLET | Freq: Every day | ORAL | Status: DC
  Administered 2020-08-10 – 2020-08-11 (×2): 40 mg via ORAL
  Filled 2020-08-10 (×2): qty 1

## 2020-08-10 MED ORDER — SODIUM CHLORIDE 0.9% FLUSH
3.0000 mL | Freq: Two times a day (BID) | INTRAVENOUS | Status: DC
  Administered 2020-08-10 – 2020-08-11 (×2): 3 mL via INTRAVENOUS

## 2020-08-10 MED ORDER — LIDOCAINE-EPINEPHRINE 1 %-1:100000 IJ SOLN
INTRAMUSCULAR | Status: AC
  Filled 2020-08-10: qty 1

## 2020-08-10 MED ORDER — DIAZEPAM 5 MG PO TABS
5.0000 mg | ORAL_TABLET | Freq: Every day | ORAL | Status: DC | PRN
  Administered 2020-08-11: 5 mg via ORAL
  Filled 2020-08-10: qty 1

## 2020-08-10 MED ORDER — CELECOXIB 200 MG PO CAPS
200.0000 mg | ORAL_CAPSULE | Freq: Two times a day (BID) | ORAL | Status: DC
  Administered 2020-08-10 – 2020-08-11 (×3): 200 mg via ORAL
  Filled 2020-08-10 (×3): qty 1

## 2020-08-10 MED ORDER — FENTANYL CITRATE (PF) 250 MCG/5ML IJ SOLN
INTRAMUSCULAR | Status: AC
  Filled 2020-08-10: qty 5

## 2020-08-10 MED ORDER — OXYCODONE HCL 5 MG/5ML PO SOLN: 5.0000 mg | Freq: Once | ORAL | Status: DC | PRN

## 2020-08-10 MED ORDER — CHLORHEXIDINE GLUCONATE 0.12 % MT SOLN
15.0000 mL | Freq: Once | OROMUCOSAL | Status: AC
  Administered 2020-08-10: 15 mL via OROMUCOSAL
  Filled 2020-08-10: qty 15

## 2020-08-10 MED ORDER — PROPOFOL 10 MG/ML IV BOLUS
INTRAVENOUS | Status: AC
  Filled 2020-08-10: qty 20

## 2020-08-10 MED ORDER — HYDROXYZINE HCL 50 MG/ML IM SOLN
50.0000 mg | Freq: Four times a day (QID) | INTRAMUSCULAR | Status: DC | PRN
  Administered 2020-08-10: 50 mg via INTRAMUSCULAR
  Filled 2020-08-10: qty 1

## 2020-08-10 MED ORDER — DEXMEDETOMIDINE (PRECEDEX) IN NS 20 MCG/5ML (4 MCG/ML) IV SYRINGE
PREFILLED_SYRINGE | INTRAVENOUS | Status: DC | PRN
  Administered 2020-08-10: 20 ug via INTRAVENOUS
  Administered 2020-08-10: 12 ug via INTRAVENOUS
  Administered 2020-08-10: 8 ug via INTRAVENOUS

## 2020-08-10 MED ORDER — ACETAMINOPHEN 650 MG RE SUPP: 650.0000 mg | RECTAL | Status: DC | PRN

## 2020-08-10 MED ORDER — PHENOL 1.4 % MT LIQD: 1.0000 | OROMUCOSAL | Status: DC | PRN

## 2020-08-10 MED ORDER — ROCURONIUM BROMIDE 10 MG/ML (PF) SYRINGE
PREFILLED_SYRINGE | INTRAVENOUS | Status: AC
  Filled 2020-08-10: qty 10

## 2020-08-10 MED ORDER — AMISULPRIDE (ANTIEMETIC) 5 MG/2ML IV SOLN
10.0000 mg | Freq: Once | INTRAVENOUS | Status: AC | PRN
  Administered 2020-08-10: 10 mg via INTRAVENOUS

## 2020-08-10 MED ORDER — FENTANYL CITRATE (PF) 100 MCG/2ML IJ SOLN
25.0000 ug | INTRAMUSCULAR | Status: DC | PRN
  Administered 2020-08-10 (×3): 50 ug via INTRAVENOUS

## 2020-08-10 MED ORDER — DOCUSATE SODIUM 100 MG PO CAPS
100.0000 mg | ORAL_CAPSULE | Freq: Two times a day (BID) | ORAL | Status: DC
  Administered 2020-08-10 – 2020-08-11 (×3): 100 mg via ORAL
  Filled 2020-08-10 (×3): qty 1

## 2020-08-10 MED ORDER — ONDANSETRON HCL 4 MG/2ML IJ SOLN
INTRAMUSCULAR | Status: DC | PRN
  Administered 2020-08-10: 4 mg via INTRAVENOUS

## 2020-08-10 MED ORDER — THROMBIN 5000 UNITS EX SOLR
OROMUCOSAL | Status: DC | PRN
  Administered 2020-08-10: 5 mL via TOPICAL

## 2020-08-10 MED ORDER — LIDOCAINE 2% (20 MG/ML) 5 ML SYRINGE
INTRAMUSCULAR | Status: DC | PRN
  Administered 2020-08-10: 40 mg via INTRAVENOUS

## 2020-08-10 MED ORDER — HYDROMORPHONE HCL 1 MG/ML IJ SOLN
1.0000 mg | INTRAMUSCULAR | Status: DC | PRN
  Administered 2020-08-10 – 2020-08-11 (×4): 1 mg via INTRAVENOUS
  Filled 2020-08-10 (×4): qty 1

## 2020-08-10 MED ORDER — TIMOLOL MALEATE 0.5 % OP SOLN
1.0000 [drp] | Freq: Two times a day (BID) | OPHTHALMIC | Status: DC
  Administered 2020-08-10 – 2020-08-11 (×2): 1 [drp] via OPHTHALMIC
  Filled 2020-08-10: qty 5

## 2020-08-10 MED ORDER — ONDANSETRON HCL 4 MG PO TABS: 4.0000 mg | ORAL_TABLET | Freq: Four times a day (QID) | ORAL | Status: DC | PRN

## 2020-08-10 MED ORDER — FENTANYL CITRATE (PF) 100 MCG/2ML IJ SOLN: 25.0000 ug | INTRAMUSCULAR | Status: DC | PRN

## 2020-08-10 MED ORDER — AMISULPRIDE (ANTIEMETIC) 5 MG/2ML IV SOLN
INTRAVENOUS | Status: AC
  Filled 2020-08-10: qty 4

## 2020-08-10 MED ORDER — OXYCODONE HCL 5 MG PO TABS: 5.0000 mg | ORAL_TABLET | ORAL | Status: DC | PRN

## 2020-08-10 MED ORDER — DEXMEDETOMIDINE (PRECEDEX) IN NS 20 MCG/5ML (4 MCG/ML) IV SYRINGE
PREFILLED_SYRINGE | INTRAVENOUS | Status: AC
  Filled 2020-08-10: qty 5

## 2020-08-10 MED ORDER — CYCLOBENZAPRINE HCL 5 MG PO TABS
5.0000 mg | ORAL_TABLET | Freq: Three times a day (TID) | ORAL | Status: DC | PRN
  Administered 2020-08-10 – 2020-08-11 (×4): 5 mg via ORAL
  Filled 2020-08-10 (×4): qty 1

## 2020-08-10 MED ORDER — NETARSUDIL-LATANOPROST 0.02-0.005 % OP SOLN: 1.0000 [drp] | Freq: Every day | OPHTHALMIC | Status: DC

## 2020-08-10 MED ORDER — DEXAMETHASONE SODIUM PHOSPHATE 10 MG/ML IJ SOLN
INTRAMUSCULAR | Status: DC | PRN
  Administered 2020-08-10: 10 mg via INTRAVENOUS

## 2020-08-10 MED ORDER — MIDAZOLAM HCL 2 MG/2ML IJ SOLN
INTRAMUSCULAR | Status: AC
  Filled 2020-08-10: qty 2

## 2020-08-10 MED ORDER — PROPOFOL 10 MG/ML IV BOLUS
INTRAVENOUS | Status: DC | PRN
  Administered 2020-08-10: 180 mg via INTRAVENOUS

## 2020-08-10 MED ORDER — SUGAMMADEX SODIUM 200 MG/2ML IV SOLN
INTRAVENOUS | Status: DC | PRN
  Administered 2020-08-10: 300 mg via INTRAVENOUS

## 2020-08-10 MED ORDER — OXYCODONE HCL 5 MG PO TABS: 5.0000 mg | ORAL_TABLET | Freq: Once | ORAL | Status: DC | PRN

## 2020-08-10 MED ORDER — HYDROMORPHONE HCL 1 MG/ML IJ SOLN
INTRAMUSCULAR | Status: AC
  Filled 2020-08-10: qty 1

## 2020-08-10 MED ORDER — DEXMEDETOMIDINE HCL IN NACL 80 MCG/20ML IV SOLN
INTRAVENOUS | Status: AC
  Filled 2020-08-10: qty 20

## 2020-08-10 MED ORDER — ROCURONIUM BROMIDE 10 MG/ML (PF) SYRINGE
PREFILLED_SYRINGE | INTRAVENOUS | Status: DC | PRN
  Administered 2020-08-10: 10 mg via INTRAVENOUS
  Administered 2020-08-10: 60 mg via INTRAVENOUS
  Administered 2020-08-10: 40 mg via INTRAVENOUS

## 2020-08-10 MED ORDER — SODIUM CHLORIDE 0.9 % IV SOLN: 250.0000 mL | INTRAVENOUS | Status: DC

## 2020-08-10 MED ORDER — FENTANYL CITRATE (PF) 100 MCG/2ML IJ SOLN
INTRAMUSCULAR | Status: AC
  Filled 2020-08-10: qty 2

## 2020-08-10 MED ORDER — AMISULPRIDE (ANTIEMETIC) 5 MG/2ML IV SOLN: 10.0000 mg | Freq: Once | INTRAVENOUS | Status: DC

## 2020-08-10 MED ORDER — 0.9 % SODIUM CHLORIDE (POUR BTL) OPTIME
TOPICAL | Status: DC | PRN
  Administered 2020-08-10: 1000 mL

## 2020-08-10 MED ORDER — MENTHOL 3 MG MT LOZG: 1.0000 | LOZENGE | OROMUCOSAL | Status: DC | PRN

## 2020-08-10 MED ORDER — CHLORHEXIDINE GLUCONATE CLOTH 2 % EX PADS: 6.0000 | MEDICATED_PAD | Freq: Once | CUTANEOUS | Status: DC

## 2020-08-10 MED ORDER — ONDANSETRON HCL 4 MG/2ML IJ SOLN: 4.0000 mg | Freq: Four times a day (QID) | INTRAMUSCULAR | Status: DC | PRN

## 2020-08-10 MED ORDER — ONDANSETRON HCL 4 MG/2ML IJ SOLN
INTRAMUSCULAR | Status: AC
  Filled 2020-08-10: qty 2

## 2020-08-10 MED ORDER — ONDANSETRON HCL 4 MG/2ML IJ SOLN: 4.0000 mg | Freq: Once | INTRAMUSCULAR | Status: DC | PRN

## 2020-08-10 MED ORDER — LIDOCAINE-EPINEPHRINE 1 %-1:100000 IJ SOLN
INTRAMUSCULAR | Status: DC | PRN
  Administered 2020-08-10: 5 mL

## 2020-08-10 MED ORDER — MIDAZOLAM HCL 2 MG/2ML IJ SOLN
INTRAMUSCULAR | Status: DC | PRN
  Administered 2020-08-10: 2 mg via INTRAVENOUS

## 2020-08-10 MED ORDER — LATANOPROST 0.005 % OP SOLN
1.0000 [drp] | Freq: Every day | OPHTHALMIC | Status: DC
  Administered 2020-08-10: 1 [drp] via OPHTHALMIC
  Filled 2020-08-10: qty 2.5

## 2020-08-10 MED ORDER — OXYCODONE HCL 5 MG PO TABS
10.0000 mg | ORAL_TABLET | ORAL | Status: DC | PRN
Start: 2020-08-10 — End: 2020-08-11
  Administered 2020-08-10 – 2020-08-11 (×4): 10 mg via ORAL
  Filled 2020-08-10 (×4): qty 2

## 2020-08-10 MED ORDER — LACTATED RINGERS IV SOLN: INTRAVENOUS | Status: DC

## 2020-08-10 MED ORDER — ORAL CARE MOUTH RINSE: 15.0000 mL | Freq: Once | OROMUCOSAL | Status: AC

## 2020-08-10 MED ORDER — ACETAMINOPHEN 325 MG PO TABS: 650.0000 mg | ORAL_TABLET | ORAL | Status: DC | PRN

## 2020-08-10 MED ORDER — POLYETHYLENE GLYCOL 3350 17 G PO PACK: 17.0000 g | PACK | Freq: Every day | ORAL | Status: DC | PRN

## 2020-08-10 MED ORDER — PANTOPRAZOLE SODIUM 40 MG PO TBEC
40.0000 mg | DELAYED_RELEASE_TABLET | Freq: Every day | ORAL | Status: DC
  Administered 2020-08-10 – 2020-08-11 (×2): 40 mg via ORAL
  Filled 2020-08-10 (×2): qty 1

## 2020-08-10 MED ORDER — HYDROMORPHONE HCL 1 MG/ML IJ SOLN
0.2500 mg | INTRAMUSCULAR | Status: DC | PRN
  Administered 2020-08-10 (×3): 0.5 mg via INTRAVENOUS

## 2020-08-10 MED ORDER — SODIUM CHLORIDE 0.9% FLUSH: 3.0000 mL | INTRAVENOUS | Status: DC | PRN

## 2020-08-10 SURGICAL SUPPLY — 55 items
ADH SKN CLS APL DERMABOND .7 (GAUZE/BANDAGES/DRESSINGS) ×1
APL SKNCLS STERI-STRIP NONHPOA (GAUZE/BANDAGES/DRESSINGS)
BAND INSRT 18 STRL LF DISP RB (MISCELLANEOUS) ×2
BAND RUBBER #18 3X1/16 STRL (MISCELLANEOUS) ×4 IMPLANT
BENZOIN TINCTURE PRP APPL 2/3 (GAUZE/BANDAGES/DRESSINGS) IMPLANT
BLADE CLIPPER SURG (BLADE) IMPLANT
BLADE SURG 11 STRL SS (BLADE) ×2 IMPLANT
BUR MATCHSTICK NEURO 3.0 LAGG (BURR) ×2 IMPLANT
CANISTER SUCT 3000ML PPV (MISCELLANEOUS) ×2 IMPLANT
COVER WAND RF STERILE (DRAPES) ×1 IMPLANT
DECANTER SPIKE VIAL GLASS SM (MISCELLANEOUS) ×2 IMPLANT
DERMABOND ADVANCED (GAUZE/BANDAGES/DRESSINGS) ×1
DERMABOND ADVANCED .7 DNX12 (GAUZE/BANDAGES/DRESSINGS) ×1 IMPLANT
DRAPE C-ARM 42X72 X-RAY (DRAPES) ×4 IMPLANT
DRAPE HALF SHEET 40X57 (DRAPES) IMPLANT
DRAPE LAPAROTOMY 100X72 PEDS (DRAPES) ×2 IMPLANT
DRAPE MICROSCOPE LEICA (MISCELLANEOUS) ×2 IMPLANT
DURAPREP 6ML APPLICATOR 50/CS (WOUND CARE) ×2 IMPLANT
ELECT COATED BLADE 2.86 ST (ELECTRODE) ×2 IMPLANT
ELECT REM PT RETURN 9FT ADLT (ELECTROSURGICAL) ×2
ELECTRODE REM PT RTRN 9FT ADLT (ELECTROSURGICAL) ×1 IMPLANT
GAUZE 4X4 16PLY RFD (DISPOSABLE) IMPLANT
GLOVE BIOGEL PI IND STRL 7.5 (GLOVE) ×2 IMPLANT
GLOVE BIOGEL PI INDICATOR 7.5 (GLOVE) ×2
GLOVE ECLIPSE 7.5 STRL STRAW (GLOVE) ×2 IMPLANT
GLOVE EXAM NITRILE LRG STRL (GLOVE) IMPLANT
GLOVE EXAM NITRILE XL STR (GLOVE) IMPLANT
GLOVE EXAM NITRILE XS STR PU (GLOVE) IMPLANT
GOWN STRL REUS W/ TWL LRG LVL3 (GOWN DISPOSABLE) ×2 IMPLANT
GOWN STRL REUS W/ TWL XL LVL3 (GOWN DISPOSABLE) IMPLANT
GOWN STRL REUS W/TWL 2XL LVL3 (GOWN DISPOSABLE) IMPLANT
GOWN STRL REUS W/TWL LRG LVL3 (GOWN DISPOSABLE) ×4
GOWN STRL REUS W/TWL XL LVL3 (GOWN DISPOSABLE)
HEMOSTAT POWDER KIT SURGIFOAM (HEMOSTASIS) ×2 IMPLANT
KIT BASIN OR (CUSTOM PROCEDURE TRAY) ×2 IMPLANT
KIT TURNOVER KIT B (KITS) ×2 IMPLANT
NDL SPNL 18GX3.5 QUINCKE PK (NEEDLE) ×1 IMPLANT
NEEDLE HYPO 22GX1.5 SAFETY (NEEDLE) ×2 IMPLANT
NEEDLE SPNL 18GX3.5 QUINCKE PK (NEEDLE) ×2 IMPLANT
NS IRRIG 1000ML POUR BTL (IV SOLUTION) ×2 IMPLANT
PACK LAMINECTOMY NEURO (CUSTOM PROCEDURE TRAY) ×2 IMPLANT
PAD ARMBOARD 7.5X6 YLW CONV (MISCELLANEOUS) ×6 IMPLANT
PIN DISTRACTION 14MM (PIN) IMPLANT
PLATE ELITE VISION 25MM (Plate) ×1 IMPLANT
SCREW SELF TAP VAR 4.0X13 (Screw) ×4 IMPLANT
SPACER BONE CORNERSTONE 8X14 (Orthopedic Implant) ×1 IMPLANT
SPONGE INTESTINAL PEANUT (DISPOSABLE) ×2 IMPLANT
SPONGE SURGIFOAM ABS GEL SZ50 (HEMOSTASIS) IMPLANT
STAPLER VISISTAT 35W (STAPLE) IMPLANT
SUT MNCRL AB 3-0 PS2 18 (SUTURE) ×2 IMPLANT
SUT VIC AB 3-0 SH 8-18 (SUTURE) ×2 IMPLANT
TAPE CLOTH 3X10 TAN LF (GAUZE/BANDAGES/DRESSINGS) ×2 IMPLANT
TOWEL GREEN STERILE (TOWEL DISPOSABLE) ×2 IMPLANT
TOWEL GREEN STERILE FF (TOWEL DISPOSABLE) ×2 IMPLANT
WATER STERILE IRR 1000ML POUR (IV SOLUTION) ×2 IMPLANT

## 2020-08-10 NOTE — Brief Op Note (Signed)
08/10/2020  10:01 AM  PATIENT:  Megan [name]  40 y.o. female  PRE-OPERATIVE DIAGNOSIS:  Cervical radiculopathy  POST-OPERATIVE DIAGNOSIS:  Cervical radiculopathy  PROCEDURE:  Procedure(s) with comments: Cervical Six-Seven Anterior cervical decompression/discectomy/fusion (N/A) - Cervical Six-Seven Anterior cervical decompression/discectomy/fusion  SURGEON:  Surgeon(s) and Role:    * Giani Winther, Clovis Pu, MD - Primary  PHYSICIAN ASSISTANT:   ASSISTANTS: Nundkumar   ANESTHESIA:   general  EBL:  50 mL   BLOOD ADMINISTERED:none  DRAINS: none   LOCAL MEDICATIONS USED:  LIDOCAINE   SPECIMEN:  No Specimen  DISPOSITION OF SPECIMEN:  N/A  COUNTS:  YES  TOURNIQUET:  * No tourniquets in log *  DICTATION: .Note written in EPIC  PLAN OF CARE: Admit for overnight observation  PATIENT DISPOSITION:  PACU - hemodynamically stable.   Delay start of Pharmacological VTE agent (>24hrs) due to surgical blood loss or risk of bleeding: yes

## 2020-08-10 NOTE — Anesthesia Procedure Notes (Signed)
Central Venous Catheter Insertion Performed by: Kipp Brood, MD Start/End6/21/2022 7:30 AM, 08/10/2020 7:35 AM Preanesthetic checklist: patient identified, IV checked, site marked and monitors and equipment checked Position: supine Catheter size: 8 Fr Total catheter length 17. Central line was placed.Double lumen Procedure performed using ultrasound guided technique. Ultrasound Notes:anatomy identified, needle tip was noted to be adjacent to the nerve/plexus identified and no ultrasound evidence of intravascular and/or intraneural injection Following insertion, line sutured, dressing applied and Biopatch. Post procedure assessment: blood return through all ports, free fluid flow and no air  Patient tolerated the procedure well with no immediate complications.

## 2020-08-10 NOTE — Anesthesia Preprocedure Evaluation (Signed)
Anesthesia Evaluation  Patient identified by MRN, date of birth, ID band Patient awake    Reviewed: Allergy & Precautions, NPO status , Patient's Chart, lab work & pertinent test results  Airway Mallampati: II  TM Distance: >3 FB Neck ROM: Full    Dental  (+) Teeth Intact, Dental Advisory Given   Pulmonary former smoker,    breath sounds clear to auscultation       Cardiovascular  Rhythm:Regular Rate:Normal     Neuro/Psych    GI/Hepatic   Endo/Other    Renal/GU      Musculoskeletal   Abdominal (+) + obese,   Peds  Hematology   Anesthesia Other Findings   Reproductive/Obstetrics                             Anesthesia Physical Anesthesia Plan  ASA: 3  Anesthesia Plan: General   Post-op Pain Management:    Induction: Intravenous  PONV Risk Score and Plan: Ondansetron and Dexamethasone  Airway Management Planned: Oral ETT  Additional Equipment: CVP  Intra-op Plan:   Post-operative Plan: Extubation in OR  Informed Consent: I have reviewed the patients History and Physical, chart, labs and discussed the procedure including the risks, benefits and alternatives for the proposed anesthesia with the patient or authorized representative who has indicated his/her understanding and acceptance.     Dental advisory given  Plan Discussed with: Anesthesiologist and CRNA  Anesthesia Plan Comments:         Anesthesia Quick Evaluation
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
## 2020-08-10 NOTE — Anesthesia Procedure Notes (Signed)
Procedure Name: Intubation Date/Time: 08/10/2020 7:52 AM Performed by: Epifanio Lesches, CRNA Pre-anesthesia Checklist: Patient identified, Emergency Drugs available, Suction available and Patient being monitored Patient Re-evaluated:Patient Re-evaluated prior to induction Oxygen Delivery Method: Circle System Utilized Preoxygenation: Pre-oxygenation with 100% oxygen Induction Type: IV induction Ventilation: Mask ventilation without difficulty and Oral airway inserted - appropriate to patient size Laryngoscope Size: Glidescope and 3 Grade View: Grade I Tube type: Oral Tube size: 7.0 mm Number of attempts: 1 Airway Equipment and Method: Stylet and Oral airway Placement Confirmation: ETT inserted through vocal cords under direct vision, positive ETCO2 and breath sounds checked- equal and bilateral Secured at: 22 cm Tube secured with: Tape Dental Injury: Teeth and Oropharynx as per pre-operative assessment  Comments: Elective glidescope intubation for ACDF

## 2020-08-10 NOTE — Transfer of Care (Signed)
Immediate Anesthesia Transfer of Care Note  Patient: Megan [name]  Procedure(s) Performed: Cervical Six-Seven Anterior cervical decompression/discectomy/fusion  Patient Location: PACU  Anesthesia Type:General  Level of Consciousness: awake and alert   Airway & Oxygen Therapy: Patient Spontanous Breathing and Patient connected to face mask oxygen  Post-op Assessment: Report given to RN and Post -op Vital signs reviewed and stable  Post vital signs: Reviewed and stable  Last Vitals:  Vitals Value Taken Time  BP 120/69 08/10/20 1003  Temp    Pulse 69 08/10/20 1005  Resp 19 08/10/20 1005  SpO2 100 % 08/10/20 1005  Vitals shown include unvalidated device data.  Last Pain:  Vitals:   08/10/20 0615  TempSrc:   PainSc: 6       Patients Stated Pain Goal: 3 (08/10/20 0615)  Complications: No notable events documented.
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
## 2020-08-10 NOTE — Anesthesia Postprocedure Evaluation (Signed)
Anesthesia Post Note  Patient: Megan [name]  Procedure(s) Performed: Cervical Six-Seven Anterior cervical decompression/discectomy/fusion     Patient location during evaluation: PACU Anesthesia Type: General Level of consciousness: awake and alert Pain management: pain level controlled Vital Signs Assessment: post-procedure vital signs reviewed and stable Respiratory status: spontaneous breathing, nonlabored ventilation, respiratory function stable and patient connected to nasal cannula oxygen Cardiovascular status: blood pressure returned to baseline and stable Postop Assessment: no apparent nausea or vomiting Anesthetic complications: no   No notable events documented.  Last Vitals:  Vitals:   08/10/20 1118 08/10/20 1143  BP: 115/74 115/78  Pulse: 64 63  Resp: 13 20  Temp: (!) 36.4 C 36.6 C  SpO2: 96% 96%    Last Pain:  Vitals:   08/10/20 1143  TempSrc: Oral  PainSc:                  [name]

## 2020-08-11 ENCOUNTER — Encounter (HOSPITAL_COMMUNITY): Payer: Self-pay | Admitting: Neurological Surgery

## 2020-08-11 DIAGNOSIS — M50123 Cervical disc disorder at C6-C7 level with radiculopathy: Secondary | ICD-10-CM | POA: Diagnosis not present

## 2020-08-11 MED ORDER — OXYCODONE HCL 10 MG PO TABS: 10.0000 mg | ORAL_TABLET | ORAL | 0 refills | Status: DC | PRN

## 2020-08-11 MED ORDER — ONDANSETRON HCL 4 MG PO TABS
4.0000 mg | ORAL_TABLET | Freq: Four times a day (QID) | ORAL | 0 refills | Status: DC | PRN
Start: 2020-08-11 — End: 2023-07-19

## 2020-08-11 MED ORDER — METHYLPREDNISOLONE 4 MG PO TBPK: ORAL_TABLET | ORAL | 0 refills | Status: DC

## 2020-08-11 NOTE — Progress Notes (Signed)
Patient is discharged from room 3C07 at this time. Alert and in stable condition. IV site d/c'd and instructions read to patient with understanding verbalized and all questions answered. Left unit via wheelchair with all belongings at side. 

## 2020-08-11 NOTE — Discharge Summary (Signed)
Discharge Summary  Date of Admission: 08/10/2020  Date of Discharge: 08/11/20  Attending Physician: Autumn Patty, MD  Hospital Course: Patient was admitted following an uncomplicated C6-7 ACDF. She was recovered in PACU and transferred to [facility]. Her hospital course was uncomplicated and the patient was discharged home on POD1. She will follow up in clinic with me in 2 weeks.  Neurologic exam at discharge:  Strength 5/5 x4, SILTx4  Discharge diagnosis: Cervical radiculopathy  Jadene Pierini, MD 08/11/20 8:47 AM
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
## 2020-08-11 NOTE — Discharge Instructions (Signed)
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
## 2020-08-20 ENCOUNTER — Encounter (INDEPENDENT_AMBULATORY_CARE_PROVIDER_SITE_OTHER): Payer: Self-pay | Admitting: Vascular Surgery

## 2020-08-20 ENCOUNTER — Other Ambulatory Visit: Payer: Self-pay

## 2020-08-20 ENCOUNTER — Telehealth (INDEPENDENT_AMBULATORY_CARE_PROVIDER_SITE_OTHER): Payer: Self-pay

## 2020-08-20 ENCOUNTER — Ambulatory Visit (INDEPENDENT_AMBULATORY_CARE_PROVIDER_SITE_OTHER): Payer: Medicaid Other | Admitting: Vascular Surgery

## 2020-08-20 VITALS — BP 118/86 | HR 72 | Resp 16 | Ht 69.0 in | Wt 256.6 lb

## 2020-08-20 DIAGNOSIS — R600 Localized edema: Secondary | ICD-10-CM

## 2020-08-20 DIAGNOSIS — E785 Hyperlipidemia, unspecified: Secondary | ICD-10-CM | POA: Diagnosis not present

## 2020-08-20 DIAGNOSIS — I890 Lymphedema, not elsewhere classified: Secondary | ICD-10-CM | POA: Insufficient documentation

## 2020-08-20 DIAGNOSIS — I10 Essential (primary) hypertension: Secondary | ICD-10-CM

## 2020-08-20 DIAGNOSIS — F17200 Nicotine dependence, unspecified, uncomplicated: Secondary | ICD-10-CM

## 2020-08-20 DIAGNOSIS — M7989 Other specified soft tissue disorders: Secondary | ICD-10-CM

## 2020-08-20 HISTORY — DX: Nicotine dependence, unspecified, uncomplicated: F17.200

## 2020-08-20 HISTORY — DX: Lymphedema, not elsewhere classified: I89.0

## 2020-08-20 HISTORY — DX: Other specified soft tissue disorders: M79.89
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
## 2020-08-20 NOTE — Assessment & Plan Note (Signed)
blood pressure control important in reducing the progression of atherosclerotic disease. On appropriate oral medications.  
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
## 2020-08-20 NOTE — Telephone Encounter (Signed)
Spoke with the patient and she is scheduled with Dr. Wyn Quaker for a left arm venogram on 08/26/20 with a 10:30 am. Pre-procedure instructions were discussed and will be mailed.
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
## 2020-08-20 NOTE — Assessment & Plan Note (Signed)
lipid control important in reducing the progression of atherosclerotic disease. Continue statin therapy  

## 2020-08-25 ENCOUNTER — Other Ambulatory Visit (INDEPENDENT_AMBULATORY_CARE_PROVIDER_SITE_OTHER): Payer: Self-pay | Admitting: Nurse Practitioner

## 2020-08-26 ENCOUNTER — Ambulatory Visit
Admission: RE | Admit: 2020-08-26 | Discharge: 2020-08-26 | Disposition: A | Discharge status: Home / Self Care | Payer: Medicaid Other | Attending: Vascular Surgery | Admitting: Vascular Surgery

## 2020-08-26 ENCOUNTER — Encounter: Payer: Self-pay | Admitting: Vascular Surgery

## 2020-08-26 ENCOUNTER — Encounter
Admission: RE | Disposition: A | Discharge status: Home / Self Care | Payer: Self-pay | Source: Home / Self Care | Attending: Vascular Surgery

## 2020-08-26 ENCOUNTER — Other Ambulatory Visit: Payer: Self-pay

## 2020-08-26 DIAGNOSIS — Z888 Allergy status to other drugs, medicaments and biological substances status: Secondary | ICD-10-CM | POA: Diagnosis not present

## 2020-08-26 DIAGNOSIS — Z8249 Family history of ischemic heart disease and other diseases of the circulatory system: Secondary | ICD-10-CM | POA: Insufficient documentation

## 2020-08-26 DIAGNOSIS — I890 Lymphedema, not elsewhere classified: Secondary | ICD-10-CM | POA: Diagnosis not present

## 2020-08-26 DIAGNOSIS — E785 Hyperlipidemia, unspecified: Secondary | ICD-10-CM | POA: Diagnosis not present

## 2020-08-26 DIAGNOSIS — E669 Obesity, unspecified: Secondary | ICD-10-CM | POA: Diagnosis not present

## 2020-08-26 DIAGNOSIS — Z6838 Body mass index (BMI) 38.0-38.9, adult: Secondary | ICD-10-CM | POA: Diagnosis not present

## 2020-08-26 DIAGNOSIS — I10 Essential (primary) hypertension: Secondary | ICD-10-CM | POA: Diagnosis not present

## 2020-08-26 DIAGNOSIS — Z8719 Personal history of other diseases of the digestive system: Secondary | ICD-10-CM | POA: Insufficient documentation

## 2020-08-26 DIAGNOSIS — Z79899 Other long term (current) drug therapy: Secondary | ICD-10-CM | POA: Diagnosis not present

## 2020-08-26 DIAGNOSIS — M7989 Other specified soft tissue disorders: Secondary | ICD-10-CM

## 2020-08-26 DIAGNOSIS — Z880 Allergy status to penicillin: Secondary | ICD-10-CM | POA: Diagnosis not present

## 2020-08-26 DIAGNOSIS — Z87891 Personal history of nicotine dependence: Secondary | ICD-10-CM | POA: Insufficient documentation

## 2020-08-26 DIAGNOSIS — Z881 Allergy status to other antibiotic agents status: Secondary | ICD-10-CM | POA: Insufficient documentation

## 2020-08-26 HISTORY — PX: UPPER EXTREMITY VENOGRAPHY: CATH118272

## 2020-08-26 SURGERY — UPPER EXTREMITY VENOGRAPHY
Anesthesia: Moderate Sedation | Laterality: Left

## 2020-08-26 MED ORDER — OXYCODONE HCL 5 MG PO TABS
ORAL_TABLET | ORAL | Status: AC
  Administered 2020-08-26: 10 mg via ORAL
  Filled 2020-08-26: qty 2

## 2020-08-26 MED ORDER — IODIXANOL 320 MG/ML IV SOLN
INTRAVENOUS | Status: DC | PRN
  Administered 2020-08-26: 10 mL via INTRAVENOUS

## 2020-08-26 MED ORDER — ONDANSETRON HCL 4 MG/2ML IJ SOLN: 4.0000 mg | Freq: Four times a day (QID) | INTRAMUSCULAR | Status: DC | PRN

## 2020-08-26 MED ORDER — MIDAZOLAM HCL 2 MG/ML PO SYRP
ORAL_SOLUTION | ORAL | Status: AC
  Filled 2020-08-26: qty 4

## 2020-08-26 MED ORDER — FAMOTIDINE 20 MG PO TABS: 40.0000 mg | ORAL_TABLET | Freq: Once | ORAL | Status: DC | PRN

## 2020-08-26 MED ORDER — MIDAZOLAM HCL 2 MG/2ML IJ SOLN
INTRAMUSCULAR | Status: AC
  Filled 2020-08-26: qty 2

## 2020-08-26 MED ORDER — MIDAZOLAM HCL 2 MG/2ML IJ SOLN
INTRAMUSCULAR | Status: DC | PRN
  Administered 2020-08-26: 2 mg via INTRAVENOUS

## 2020-08-26 MED ORDER — MIDAZOLAM HCL 2 MG/ML PO SYRP
8.0000 mg | ORAL_SOLUTION | Freq: Once | ORAL | Status: AC | PRN
  Administered 2020-08-26: 8 mg via ORAL

## 2020-08-26 MED ORDER — OXYCODONE HCL 5 MG PO TABS: 10.0000 mg | ORAL_TABLET | Freq: Once | ORAL | Status: AC

## 2020-08-26 MED ORDER — CLINDAMYCIN PHOSPHATE 300 MG/50ML IV SOLN
INTRAVENOUS | Status: AC
  Filled 2020-08-26: qty 50

## 2020-08-26 MED ORDER — SODIUM CHLORIDE 0.9 % IV SOLN: INTRAVENOUS | Status: DC

## 2020-08-26 MED ORDER — FENTANYL CITRATE (PF) 100 MCG/2ML IJ SOLN
INTRAMUSCULAR | Status: DC | PRN
  Administered 2020-08-26: 50 ug via INTRAVENOUS

## 2020-08-26 MED ORDER — METHYLPREDNISOLONE SODIUM SUCC 125 MG IJ SOLR: 125.0000 mg | Freq: Once | INTRAMUSCULAR | Status: DC | PRN

## 2020-08-26 MED ORDER — DIPHENHYDRAMINE HCL 50 MG/ML IJ SOLN: 50.0000 mg | Freq: Once | INTRAMUSCULAR | Status: DC | PRN

## 2020-08-26 MED ORDER — FENTANYL CITRATE (PF) 100 MCG/2ML IJ SOLN
INTRAMUSCULAR | Status: AC
  Filled 2020-08-26: qty 2

## 2020-08-26 MED ORDER — HEPARIN SODIUM (PORCINE) 1000 UNIT/ML IJ SOLN
INTRAMUSCULAR | Status: AC
  Filled 2020-08-26: qty 1

## 2020-08-26 MED ORDER — HYDROMORPHONE HCL 1 MG/ML IJ SOLN: 1.0000 mg | Freq: Once | INTRAMUSCULAR | Status: DC | PRN

## 2020-08-26 MED ORDER — CLINDAMYCIN PHOSPHATE 300 MG/50ML IV SOLN: 300.0000 mg | Freq: Once | INTRAVENOUS | Status: DC

## 2020-08-26 SURGICAL SUPPLY — 6 items
CANNULA 5F STIFF (CANNULA) ×1 IMPLANT
COVER PROBE U/S 5X48 (MISCELLANEOUS) ×1 IMPLANT
DRAPE BRACHIAL (DRAPES) ×1 IMPLANT
PACK ANGIOGRAPHY (CUSTOM PROCEDURE TRAY) ×1 IMPLANT
SHEATH BRITE TIP 6FRX5.5 (SHEATH) ×1 IMPLANT
SUT MNCRL AB 4-0 PS2 18 (SUTURE) ×1 IMPLANT
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
## 2020-08-26 NOTE — Interval H&P Note (Signed)
History and Physical Interval Note:  08/26/2020 10:41 AM  Megan [name]  has presented today for surgery, with the diagnosis of LT Arm Venogram   LT arm swelling.  The various methods of treatment have been discussed with the patient and family. After consideration of risks, benefits and other options for treatment, the patient has consented to  Procedure(s): UPPER EXTREMITY VENOGRAPHY (Left) as a surgical intervention.  The patient's history has been reviewed, patient examined, no change in status, stable for surgery.  I have reviewed the patient's chart and labs.  Questions were answered to the patient's satisfaction.     Festus Barren

## 2020-08-26 NOTE — OR Nursing (Signed)
Unsuccessful attempts at IV x 3, pt reports she has needed PICC lines in past. Dr Wyn Quaker notified. Plan to give meds via sheath. Versed 8 mg po given

## 2020-08-27 ENCOUNTER — Encounter: Payer: Self-pay | Admitting: Vascular Surgery

## 2020-09-17 ENCOUNTER — Encounter (INDEPENDENT_AMBULATORY_CARE_PROVIDER_SITE_OTHER): Payer: Self-pay | Admitting: Vascular Surgery

## 2020-09-17 ENCOUNTER — Ambulatory Visit (INDEPENDENT_AMBULATORY_CARE_PROVIDER_SITE_OTHER): Payer: Medicaid Other | Admitting: Vascular Surgery

## 2020-09-17 ENCOUNTER — Other Ambulatory Visit: Payer: Self-pay

## 2020-09-17 VITALS — BP 120/81 | HR 80 | Ht 69.0 in | Wt 261.0 lb

## 2020-09-17 DIAGNOSIS — R600 Localized edema: Secondary | ICD-10-CM | POA: Diagnosis not present

## 2020-09-17 DIAGNOSIS — I83813 Varicose veins of bilateral lower extremities with pain: Secondary | ICD-10-CM

## 2020-09-17 DIAGNOSIS — I10 Essential (primary) hypertension: Secondary | ICD-10-CM

## 2020-09-17 DIAGNOSIS — I890 Lymphedema, not elsewhere classified: Secondary | ICD-10-CM

## 2020-09-17 DIAGNOSIS — M7989 Other specified soft tissue disorders: Secondary | ICD-10-CM

## 2020-09-17 HISTORY — DX: Varicose veins of bilateral lower extremities with pain: I83.813

## 2020-09-17 NOTE — Assessment & Plan Note (Signed)
No surgery or intervention at this point in time.    I have had a long discussion with the patient regarding venous insufficiency and why it  causes symptoms. I have discussed with the patient the chronic skin changes that accompany venous insufficiency and the long term sequela such as infection and ulceration.  Patient will begin wearing graduated compression stockings class 1 (20-30 mmHg) or compression wraps on a daily basis a prescription was given. The patient will put the stockings on first thing in the morning and removing them in the evening. The patient is instructed specifically not to sleep in the stockings.    In addition, behavioral modification including several periods of elevation of the lower extremities during the day will be continued. I have demonstrated that proper elevation is a position with the ankles at heart level.  The patient is instructed to begin routine exercise, especially walking on a daily basis  Patient should undergo duplex ultrasound of the venous system to ensure that DVT or reflux is not present.  Following the review of the ultrasound the patient will follow up in 2-3 months to reassess the degree of swelling and the control that graduated compression stockings or compression wraps  is offering.   The patient can be assessed for a Lymph Pump at that time

## 2020-09-17 NOTE — Assessment & Plan Note (Signed)
The patient has lymphedema of the left arm.  This has persistent pain and swelling refractory to compression and elevation.  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  At this point, she needs a left arm lymphedema pump to help with her symptoms.  We will try to get that obtained.  We will see her back in several months.
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
## 2020-09-17 NOTE — Progress Notes (Signed)
MRN : [id number]  Megan [name] is a 40 y.o. (05-15-1980) female who presents with chief complaint of  Chief Complaint  Patient presents with   Follow-up    3 wk [facility] post UE venography   .  History of Present Illness: Patient returns today in follow up of her left arm swelling. She is wearing a sleeve.  Things are about the same, maybe a little less swollen.  No periprocedural complications after her venogram.  Venogram was normal. She also complains of painful varicose veins today.  Both legs are affected but the right is the worst.  Current Outpatient Medications  Medication Sig Dispense Refill   atorvastatin (LIPITOR) 40 MG tablet Take 40 mg by mouth daily.     celecoxib (CELEBREX) 200 MG capsule Take 200 mg by mouth 2 (two) times daily.     cyclobenzaprine (FLEXERIL) 5 MG tablet Take 1 tablet (5 mg total) by mouth 3 (three) times daily as needed (muscle pain/aches). 20 tablet 0   diazepam (VALIUM) 5 MG tablet Take 5 mg by mouth daily as needed for anxiety.     meloxicam (MOBIC) 7.5 MG tablet meloxicam 7.5 mg tablet  Take 1 tablet twice a day by oral route with meals.     Netarsudil-Latanoprost (ROCKLATAN) 0.02-0.005 % SOLN Place 1 drop into both eyes at bedtime.     omeprazole (PRILOSEC) 20 MG capsule Take 20 mg by mouth daily.      ondansetron (ZOFRAN) 4 MG tablet Take 1 tablet (4 mg total) by mouth every 8 (eight) hours as needed for nausea or vomiting. 30 tablet 0   ondansetron (ZOFRAN) 4 MG tablet Take 1 tablet (4 mg total) by mouth every 6 (six) hours as needed for nausea or vomiting. 20 tablet 0   oxyCODONE 10 MG TABS Take 1 tablet (10 mg total) by mouth every 4 (four) hours as needed for severe pain ((score 7 to 10)). 30 tablet 0   timolol (TIMOPTIC) 0.5 % ophthalmic solution Place 1 drop into both eyes 2 (two) times daily.     No current facility-administered medications for this visit.    Past Medical History:  Diagnosis Date   Cirrhosis (HCC)    Diverticulosis     Dyslipidemia    GERD (gastroesophageal reflux disease)    H/O ileostomy    Hepatitis C    s/p treatment   Hypercholesteremia    Obesity    PCOS (polycystic ovarian syndrome)    PTSD (post-traumatic stress disorder)    Steatosis     Past Surgical History:  Procedure Laterality Date   ANTERIOR CERVICAL DECOMP/DISCECTOMY FUSION N/A 08/10/2020   Procedure: Cervical Six-Seven Anterior cervical decompression/discectomy/fusion;  Surgeon: Jadene Pierinistergard, Thomas A, MD;  [facility];  Service: Neurosurgery;  Laterality: N/A;  Cervical Six-Seven Anterior cervical decompression/discectomy/fusion   BACK SURGERY     COLON SURGERY  09/2018   @ [facility]; and again 12/2018   FRACTURE SURGERY     left hand   HAND SURGERY Left    HERNIA REPAIR  09/2019   Incisional hernia repair   KNEE ARTHROSCOPY Right    LIVER BIOPSY     LUMBAR LAMINECTOMY/DECOMPRESSION MICRODISCECTOMY N/A 07/15/2018   Procedure: L5-S1 MINIMALLY INVASIVE LUMBAR DISCECTOMY;  Surgeon: Jadene Pierinistergard, Thomas A, MD;  Location: [facility] OR;  Service: Neurosurgery;  Laterality: N/A;   SHOULDER ARTHROSCOPY WITH LABRAL REPAIR Left 04/20/2020   Procedure: LEFT SHOULDER ARTHROSCOPY WITH LABRAL REPAIR, SUBACROMIAL DEBRIDEMENT;  Surgeon: Juanell FairlyKrasinski, Kevin, MD;  Location: [facility] ORS;  Service: Orthopedics;  Laterality: Left;   TONSILLECTOMY     UPPER EXTREMITY VENOGRAPHY Left 08/26/2020   Procedure: UPPER EXTREMITY VENOGRAPHY;  Surgeon: Annice Needy, MD;  Location: [facility] INVASIVE CV LAB;  Service: Cardiovascular;  Laterality: Left;     Social History   Tobacco Use   Smoking status: Former    Packs/day: 0.50    Years: 15.00    Pack years: 7.50    Types: Cigarettes    Quit date: 03/30/2015    Years since quitting: 5.4   Smokeless tobacco: Never  Vaping Use   Vaping Use: Never used  Substance Use Topics   Alcohol use: Not Currently    Comment: social   Drug use: No       Family History  Problem Relation Age of Onset   High blood pressure Mother     Heart attack Father    High blood pressure Father    Heart attack Paternal Grandfather        61s   Colon cancer Maternal Grandfather    Breast cancer Maternal Aunt        REVIEW OF SYSTEMS (Negative unless checked)  Constitutional: Weight loss  Fever  Chills Cardiac: Chest pain   Chest pressure   Palpitations   Shortness of breath when laying flat   Shortness of breath at rest   Shortness of breath with exertion. Vascular:  Pain in legs with walking   Pain in legs at rest   Pain in legs when laying flat   Claudication   Pain in feet when walking  Pain in feet at rest  Pain in feet when laying flat   History of DVT   Phlebitis   Swelling in legs   Varicose veins   Non-healing ulcers Pulmonary:   Uses home oxygen   Productive cough   Hemoptysis   Wheeze  COPD   Asthma Neurologic:  Dizziness  Blackouts   Seizures   History of stroke   History of TIA  Aphasia   Temporary blindness   Dysphagia   Weakness or numbness in arms   Weakness or numbness in legs Musculoskeletal:  Arthritis   Joint swelling   Joint pain   Low back pain Hematologic:  Easy bruising  Easy bleeding   Hypercoagulable state   Anemic   Gastrointestinal:  Blood in stool   Vomiting blood  Gastroesophageal reflux/heartburn   Abdominal pain Genitourinary:  Chronic kidney disease   Difficult urination  Frequent urination  Burning with urination   Hematuria Skin:  Rashes   Ulcers   Wounds Psychological:  History of anxiety    History of major depression.  Physical Examination  BP 120/81   Pulse 80   Ht  (1.753 m)   Wt 261 lb (118.4 kg)   BMI 38.54 kg/m  Gen:  WD/WN, NAD Head: [place]/AT, No temporalis wasting. Ear/Nose/Throat: Hearing grossly intact, nares w/o erythema or drainage Eyes: Conjunctiva clear. Sclera non-icteric Neck: Supple.  Trachea midline Pulmonary:  Good air movement, no use of  accessory muscles.  Cardiac: RRR, no JVD Vascular: Prominent varicosities on the medial aspect of the right thigh.  Less prominent varicosities on the medial aspect of the left thigh. Vessel Right Left  Radial Palpable Palpable                          PT Palpable Palpable  DP Palpable Palpable   Gastrointestinal: soft, non-tender/non-distended. No guarding/reflex.  Musculoskeletal:  M/S 5/5 throughout.  No deformity or atrophy.  1-2+ left upper extremity edema. Neurologic: Sensation grossly intact in extremities.  Symmetrical.  Speech is fluent.  Psychiatric: Judgment intact, Mood & affect appropriate for pt's clinical situation. Dermatologic: No rashes or ulcers noted.  No cellulitis or open wounds.      Labs Recent Results (from the past 2160 hour(s))  Surgical pcr screen     Status: None   Collection Time: 08/06/20  8:58 AM   Specimen: Nasal Mucosa; Nasal Swab  Result Value Ref Range   MRSA, PCR NEGATIVE NEGATIVE   Staphylococcus aureus NEGATIVE NEGATIVE    Comment: (NOTE) The Xpert SA Assay (FDA approved for NASAL specimens in patients 54 years of age and older), is one component of a comprehensive surveillance program. It is not intended to diagnose infection nor to guide or monitor treatment. Performed at [facility], [address], [place], [place] [address]   SARS CORONAVIRUS 2 (TAT 6-24 HRS) Nasopharyngeal Nasopharyngeal Swab     Status: None   Collection Time: 08/06/20  8:59 AM   Specimen: Nasopharyngeal Swab  Result Value Ref Range   SARS Coronavirus 2 NEGATIVE NEGATIVE    Comment: (NOTE) SARS-CoV-2 target nucleic acids are NOT DETECTED.  The SARS-CoV-2 RNA is generally detectable in upper and lower respiratory specimens during the acute phase of infection. Negative results do not preclude SARS-CoV-2 infection, do not rule out co-infections with other pathogens, and should not be used as the sole basis for treatment or other patient management  decisions. Negative results must be combined with clinical observations, patient history, and epidemiological information. The expected result is Negative.  Fact Sheet for Patients: HairSlick.no  Fact Sheet for Healthcare Providers: quierodirigir.com  This test is not yet approved or cleared by the [place] FDA and  has been authorized for detection and/or diagnosis of SARS-CoV-2 by FDA under an Emergency Use Authorization (EUA). This EUA will remain  in effect (meaning this test can be used) for the duration of the COVID-19 declaration under Se ction 564(b)(1) of the Act, 21 U.S.C. section 360bbb-3(b)(1), unless the authorization is terminated or revoked sooner.  Performed at [facility], [address], [place], [place] [address]   Comprehensive metabolic panel per protocol     Status: Abnormal   Collection Time: 08/06/20  9:07 AM  Result Value Ref Range   Sodium 138 135 - 145 mmol/L   Potassium 4.0 3.5 - 5.1 mmol/L   Chloride 106 98 - 111 mmol/L   CO2 24 22 - 32 mmol/L   Glucose, Bld 89 70 - 99 mg/dL    Comment: Glucose reference range applies only to samples taken after fasting for at least 8 hours.   BUN 10 6 - 20 mg/dL   Creatinine, Ser 9.17 0.44 - 1.00 mg/dL   Calcium 8.6 (L) 8.9 - 10.3 mg/dL   Total Protein 6.7 6.5 - 8.1 g/dL   Albumin 3.5 3.5 - 5.0 g/dL   AST 19 15 - 41 U/L   ALT 17 0 - 44 U/L   Alkaline Phosphatase 76 38 - 126 U/L   Total Bilirubin 0.5 0.3 - 1.2 mg/dL   GFR, Estimated >91 >50 mL/min    Comment: (NOTE) Calculated using the CKD-EPI Creatinine Equation (2021)    Anion gap 8 5 - 15    Comment: Performed at [facility], [address] [place], [place] [address]  CBC per protocol     Status:  Abnormal   Collection Time: 08/06/20  9:07 AM  Result Value Ref Range   WBC 7.3 4.0 - 10.5 K/uL   RBC 4.07 3.87 - 5.11 MIL/uL   Hemoglobin 11.7 (L) 12.0 - 15.0 g/dL   HCT 32.4 40.1 -  02.7 %   MCV 95.1 80.0 - 100.0 fL   MCH 28.7 26.0 - 34.0 pg   MCHC 30.2 30.0 - 36.0 g/dL   RDW 25.3 66.4 - 40.3 %   Platelets 253 150 - 400 K/uL   nRBC 0.0 0.0 - 0.2 %    Comment: Performed at [facility], [address] [place], [place] [address]  Type and screen     Status: None   Collection Time: 08/06/20  9:44 AM  Result Value Ref Range   ABO/RH(D) O POS    Antibody Screen NEG    Sample Expiration 08/20/2020,2359    Extend sample reason      NO TRANSFUSIONS OR PREGNANCY IN THE PAST 3 MONTHS Performed at [facility], [address] [place], [place] [address]   ABO/Rh     Status: None   Collection Time: 08/10/20  7:00 AM  Result Value Ref Range   ABO/RH(D)      O POS Performed at [facility], [address], [place], [place] [address]   Pregnancy, urine POC     Status: None   Collection Time: 08/10/20  7:05 AM  Result Value Ref Range   Preg Test, Ur NEGATIVE NEGATIVE    Comment:        THE SENSITIVITY OF THIS METHODOLOGY IS >24 mIU/mL     Radiology PERIPHERAL VASCULAR CATHETERIZATION  Result Date: 08/26/2020 See surgical note for result.   Assessment/Plan  Left arm swelling Venogram was normal.  Appears to be lymphedema after her trauma, injury, and subsequent left shoulder surgery  Lymphedema The patient has lymphedema of the left arm.  This has persistent pain and swelling refractory to compression and elevation.  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  At this point, she needs a left arm lymphedema pump to help with her symptoms.  We will try to get that obtained.  We will see her back in several months.  Essential (primary) hypertension blood pressure control important in reducing the progression of atherosclerotic disease. On appropriate oral medications.   Varicose veins of leg with pain, bilateral No surgery or intervention at this point in time.    I have had a long discussion with the patient regarding venous insufficiency and why it   causes symptoms. I have discussed with the patient the chronic skin changes that accompany venous insufficiency and the long term sequela such as infection and ulceration.  Patient will begin wearing graduated compression stockings class 1 (20-30 mmHg) or compression wraps on a daily basis a prescription was given. The patient will put the stockings on first thing in the morning and removing them in the evening. The patient is instructed specifically not to sleep in the stockings.    In addition, behavioral modification including several periods of elevation of the lower extremities during the day will be continued. I have demonstrated that proper elevation is a position with the ankles at heart level.  The patient is instructed to begin routine exercise, especially walking on a daily basis  Patient should undergo duplex ultrasound of the venous system to ensure that DVT or reflux is not present.  Following the review of the ultrasound the patient will  follow up in 2-3 months to reassess the degree of swelling and the control that graduated compression stockings or compression wraps  is offering.   The patient can be assessed for a Lymph Pump at that time    Festus Barren, MD  09/17/2020 12:50 PM    This note was created with Dragon medical transcription system.  Any errors from dictation are purely unintentional

## 2020-09-17 NOTE — Assessment & Plan Note (Signed)
blood pressure control important in reducing the progression of atherosclerotic disease. On appropriate oral medications.  

## 2020-10-19 ENCOUNTER — Encounter (INDEPENDENT_AMBULATORY_CARE_PROVIDER_SITE_OTHER): Payer: Self-pay | Admitting: Nurse Practitioner

## 2020-10-19 ENCOUNTER — Ambulatory Visit (INDEPENDENT_AMBULATORY_CARE_PROVIDER_SITE_OTHER): Payer: Medicaid Other

## 2020-10-19 ENCOUNTER — Other Ambulatory Visit: Payer: Self-pay

## 2020-10-19 ENCOUNTER — Ambulatory Visit (INDEPENDENT_AMBULATORY_CARE_PROVIDER_SITE_OTHER): Payer: Medicaid Other | Admitting: Nurse Practitioner

## 2020-10-19 VITALS — BP 130/86 | HR 62 | Resp 16 | Wt 262.8 lb

## 2020-10-19 DIAGNOSIS — E785 Hyperlipidemia, unspecified: Secondary | ICD-10-CM

## 2020-10-19 DIAGNOSIS — I10 Essential (primary) hypertension: Secondary | ICD-10-CM

## 2020-10-19 DIAGNOSIS — I83813 Varicose veins of bilateral lower extremities with pain: Secondary | ICD-10-CM

## 2020-10-30 ENCOUNTER — Encounter (INDEPENDENT_AMBULATORY_CARE_PROVIDER_SITE_OTHER): Payer: Self-pay | Admitting: Nurse Practitioner

## 2020-10-30 NOTE — Progress Notes (Signed)
Subjective:    Patient ID: Megan [name], female    DOB: 12-Dec-1980, 40 y.o.   MRN: [id number] Chief Complaint  Patient presents with   Follow-up    Ultrasound follow up    Megan [name] is a 40 year old female that is seen for evaluation of symptomatic varicose veins. The patient relates burning and stinging which worsened steadily throughout the course of the day, particularly with standing. The patient also notes an aching and throbbing pain over the varicosities, particularly with prolonged dependent positions. The symptoms are significantly improved with elevation.  The patient also notes that during hot weather the symptoms are greatly intensified. The patient states the pain from the varicose veins interferes with work, daily exercise, shopping and household maintenance. At this point, the symptoms are persistent and severe enough that they're having a negative impact on lifestyle and are interfering with daily activities.  There is no history of DVT, PE or superficial thrombophlebitis. There is no history of ulceration or hemorrhage. The patient endorses a significant family history of varicose veins.   The patient has worn graduated compression for over 3 months. At the present time the patient has been using over-the-counter analgesics. There is no history of prior surgical intervention or sclerotherapy.  Today noninvasive studies show evidence of reflux in the right great saphenous vein at the saphenofemoral junction extending to the knee.  There is also reflux in the left great saphenous vein at the saphenofemoral junction extending to the knee and proximal calf.  No evidence of DVT or superficial thrombophlebitis seen bilaterally.   Review of Systems  Cardiovascular:  Positive for leg swelling.  All other systems reviewed and are negative.     Objective:   Physical Exam Vitals reviewed.  HENT:     Head: Normocephalic.  Cardiovascular:     Rate and Rhythm: Normal rate.      Pulses: Normal pulses.  Pulmonary:     Effort: Pulmonary effort is normal.  Musculoskeletal:     Right lower leg: Edema present.     Left lower leg: Edema present.  Skin:    Comments: Multiple varicosities bilaterally  Neurological:     Mental Status: She is alert and oriented to person, place, and time.  Psychiatric:        Mood and Affect: Mood normal.        Behavior: Behavior normal.        Thought Content: Thought content normal.        Judgment: Judgment normal.    BP 130/86 (BP Location: Right Arm)   Pulse 62   Resp 16   Wt 262 lb 12.8 oz (119.2 kg)   BMI 38.81 kg/m   Past Medical History:  Diagnosis Date   Cirrhosis (HCC)    Diverticulosis    Dyslipidemia    GERD (gastroesophageal reflux disease)    H/O ileostomy    Hepatitis C    s/p treatment   Hypercholesteremia    Obesity    PCOS (polycystic ovarian syndrome)    PTSD (post-traumatic stress disorder)    Steatosis     Social History   Socioeconomic History   Marital status: Single    Spouse name: Not on file   Number of children: 0   Years of education: Not on file   Highest education level: Not on file  Occupational History   Occupation: Bartender  Tobacco Use   Smoking status: Former    Packs/day: 0.50    Years: 15.00  Pack years: 7.50    Types: Cigarettes    Quit date: 03/30/2015    Years since quitting: 5.5   Smokeless tobacco: Never  Vaping Use   Vaping Use: Never used  Substance and Sexual Activity   Alcohol use: Not Currently    Comment: social   Drug use: No   Sexual activity: Not on file    Comment: last period two weeks ago, negative pregnancy test 08/06/20 no sexual activity 3 months per pt. Dr Truitt Merle.  Other Topics Concern   Not on file  Social History Narrative   ** Merged History Encounter **    room mate   Social Determinants of Health   Financial Resource Strain: Not on file  Food Insecurity: Not on file  Transportation Needs: Not on file  Physical Activity: Not  on file  Stress: Not on file  Social Connections: Not on file  Intimate Partner Violence: Not on file    Past Surgical History:  Procedure Laterality Date   ANTERIOR CERVICAL DECOMP/DISCECTOMY FUSION N/A 08/10/2020   Procedure: Cervical Six-Seven Anterior cervical decompression/discectomy/fusion;  Surgeon: Jadene Pierini, MD;  Location: [facility] OR;  Service: Neurosurgery;  Laterality: N/A;  Cervical Six-Seven Anterior cervical decompression/discectomy/fusion   BACK SURGERY     COLON SURGERY  09/2018   @ [facility]; and again 12/2018   FRACTURE SURGERY     left hand   HAND SURGERY Left    HERNIA REPAIR  09/2019   Incisional hernia repair   KNEE ARTHROSCOPY Right    LIVER BIOPSY     LUMBAR LAMINECTOMY/DECOMPRESSION MICRODISCECTOMY N/A 07/15/2018   Procedure: L5-S1 MINIMALLY INVASIVE LUMBAR DISCECTOMY;  Surgeon: Jadene Pierini, MD;  Location: [facility] OR;  Service: Neurosurgery;  Laterality: N/A;   SHOULDER ARTHROSCOPY WITH LABRAL REPAIR Left 04/20/2020   Procedure: LEFT SHOULDER ARTHROSCOPY WITH LABRAL REPAIR, SUBACROMIAL DEBRIDEMENT;  Surgeon: Juanell Fairly, MD;  Location: [facility] ORS;  Service: Orthopedics;  Laterality: Left;   TONSILLECTOMY     UPPER EXTREMITY VENOGRAPHY Left 08/26/2020   Procedure: UPPER EXTREMITY VENOGRAPHY;  Surgeon: Annice Needy, MD;  Location: [facility] INVASIVE CV LAB;  Service: Cardiovascular;  Laterality: Left;    Family History  Problem Relation Age of Onset   High blood pressure Mother    Heart attack Father    High blood pressure Father    Heart attack Paternal Grandfather        36s   Colon cancer Maternal Grandfather    Breast cancer Maternal Aunt     Allergies  Allergen Reactions   Levofloxacin Itching    Other reaction(s): Itching   Lisinopril Anaphylaxis and Swelling    Other reaction(s): Anaphylaxis, Angioedema (ALLERGY/intolerance), Swelling Other reaction(s): Angioedema    Varenicline Nausea And Vomiting   Clindamycin Nausea And Vomiting    Other  reaction(s): Nausea And Vomiting   Penicillins Other (See Comments)    Childhood reaction    CBC Latest Ref Rng & Units 08/06/2020 04/16/2020 02/08/2020  WBC 4.0 - 10.5 K/uL 7.3 6.7 7.9  Hemoglobin 12.0 - 15.0 g/dL 11.7(L) 11.9(L) 13.6  Hematocrit 36.0 - 46.0 % 38.7 36.7 41.5  Platelets 150 - 400 K/uL 253 221 295      CMP     Component Value Date/Time   NA 138 08/06/2020 0907   K 4.0 08/06/2020 0907   CL 106 08/06/2020 0907   CO2 24 08/06/2020 0907   GLUCOSE 89 08/06/2020 0907   BUN 10 08/06/2020 0907   CREATININE 0.73 08/06/2020 0907  CALCIUM 8.6 (L) 08/06/2020 0907   PROT 6.7 08/06/2020 0907   ALBUMIN 3.5 08/06/2020 0907   AST 19 08/06/2020 0907   ALT 17 08/06/2020 0907   ALKPHOS 76 08/06/2020 0907   BILITOT 0.5 08/06/2020 0907   GFRNONAA >60 08/06/2020 0907   GFRAA >60 07/14/2018 1641     No results found.     Assessment & Plan:   1. Varicose veins of leg with pain, bilateral Recommend  I have reviewed my previous  discussion with the patient regarding  varicose veins and why they cause symptoms. Patient will continue  wearing graduated compression stockings class 1 on a daily basis, beginning first thing in the morning and removing them in the evening.    In addition, behavioral modification including elevation during the day was again discussed and this will continue.  The patient has utilized over the counter pain medications and has been exercising.  However, at this time conservative therapy has not alleviated the patient's symptoms of leg pain and swelling  Recommend: laser ablation of the right and  left great saphenous veins to eliminate the symptoms of pain and swelling of the lower extremities caused by the severe superficial venous reflux disease.    2. Dyslipidemia Continue statin as ordered and reviewed, no changes at this time   3. Essential (primary) hypertension Continue antihypertensive medications as already ordered, these medications have  been reviewed and there are no changes at this time.    Current Outpatient Medications on File Prior to Visit  Medication Sig Dispense Refill   atorvastatin (LIPITOR) 40 MG tablet Take 40 mg by mouth daily.     celecoxib (CELEBREX) 200 MG capsule Take 200 mg by mouth 2 (two) times daily.     cyclobenzaprine (FLEXERIL) 5 MG tablet Take 1 tablet (5 mg total) by mouth 3 (three) times daily as needed (muscle pain/aches). 20 tablet 0   diazepam (VALIUM) 5 MG tablet Take 5 mg by mouth daily as needed for anxiety.     meloxicam (MOBIC) 7.5 MG tablet meloxicam 7.5 mg tablet  Take 1 tablet twice a day by oral route with meals.     Netarsudil-Latanoprost (ROCKLATAN) 0.02-0.005 % SOLN Place 1 drop into both eyes at bedtime.     omeprazole (PRILOSEC) 20 MG capsule Take 20 mg by mouth daily.      ondansetron (ZOFRAN) 4 MG tablet Take 1 tablet (4 mg total) by mouth every 8 (eight) hours as needed for nausea or vomiting. 30 tablet 0   ondansetron (ZOFRAN) 4 MG tablet Take 1 tablet (4 mg total) by mouth every 6 (six) hours as needed for nausea or vomiting. 20 tablet 0   oxyCODONE 10 MG TABS Take 1 tablet (10 mg total) by mouth every 4 (four) hours as needed for severe pain ((score 7 to 10)). 30 tablet 0   timolol (TIMOPTIC) 0.5 % ophthalmic solution Place 1 drop into both eyes 2 (two) times daily.     No current facility-administered medications on file prior to visit.    There are no Patient Instructions on file for this visit. No follow-ups on file.   Georgiana Spinner, NP

## 2020-11-23 DIAGNOSIS — M25612 Stiffness of left shoulder, not elsewhere classified: Secondary | ICD-10-CM | POA: Insufficient documentation

## 2020-11-23 HISTORY — DX: Stiffness of left shoulder, not elsewhere classified: M25.612

## 2020-12-02 ENCOUNTER — Other Ambulatory Visit: Payer: Self-pay | Admitting: Nurse Practitioner

## 2020-12-02 DIAGNOSIS — K7469 Other cirrhosis of liver: Secondary | ICD-10-CM

## 2020-12-09 ENCOUNTER — Ambulatory Visit
Admission: RE | Admit: 2020-12-09 | Discharge: 2020-12-09 | Disposition: A | Discharge status: Home / Self Care | Payer: Medicaid Other | Source: Ambulatory Visit | Attending: Nurse Practitioner | Admitting: Nurse Practitioner

## 2020-12-09 DIAGNOSIS — K7469 Other cirrhosis of liver: Secondary | ICD-10-CM

## 2020-12-09 IMAGING — US US ABDOMEN LIMITED
1 series · 14 of 25 positions shown · non-contrast
Comparison: [DATE]

CLINICAL DATA: History of cirrhosis, history of hepatitis C,
screening for HCC

EXAM:
ULTRASOUND ABDOMEN LIMITED RIGHT UPPER QUADRANT

[Series 1: us abdomen limited · 0.18mm/px · 14 of 50 slices shown]
[im 1/50]
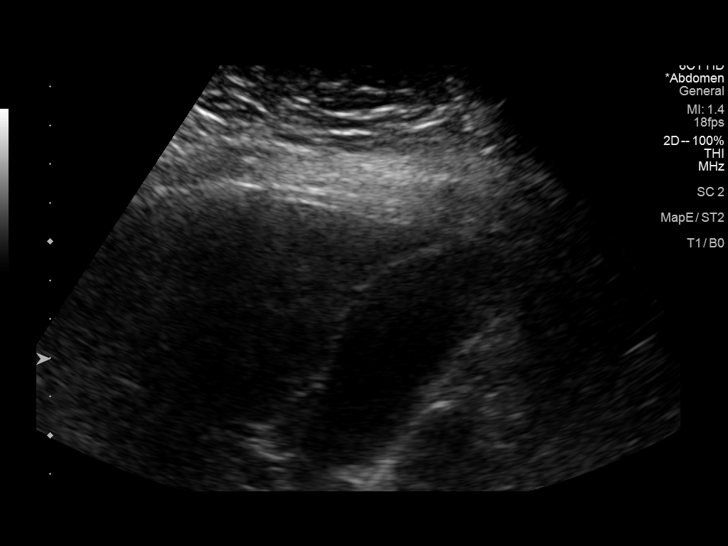
[im 5/50]
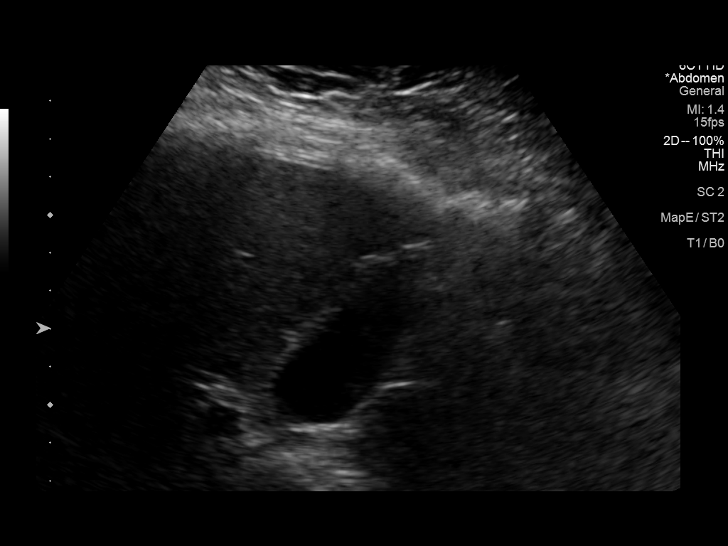
[im 9/50]
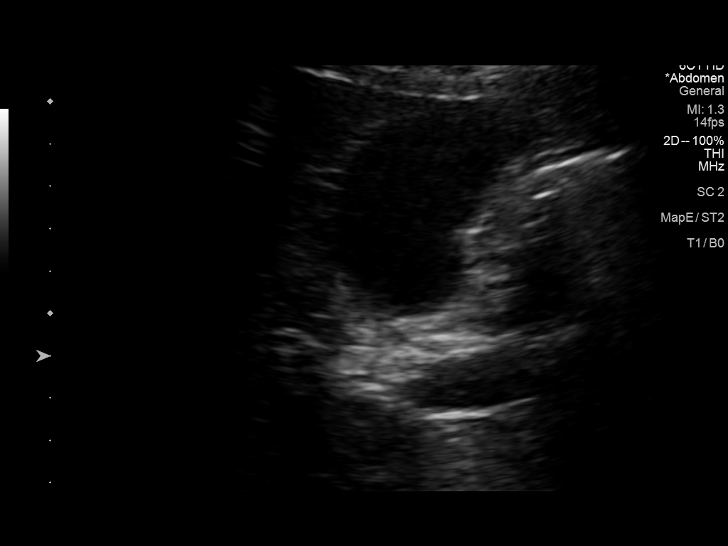
[im 13/50]
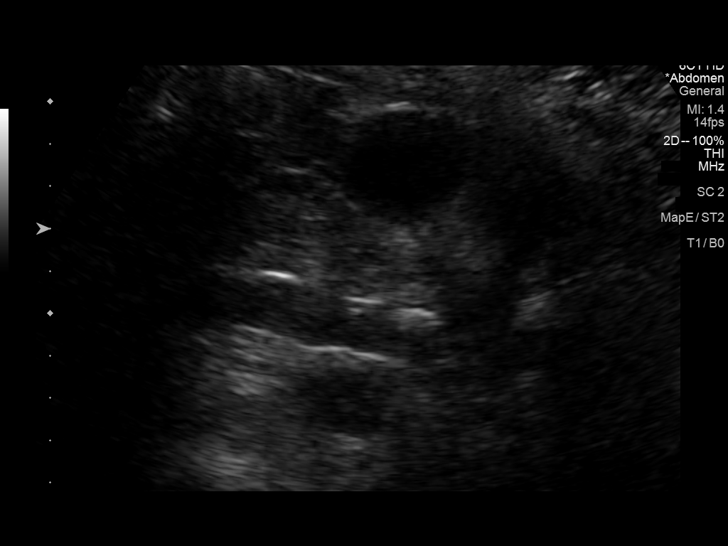
[im 17/50]
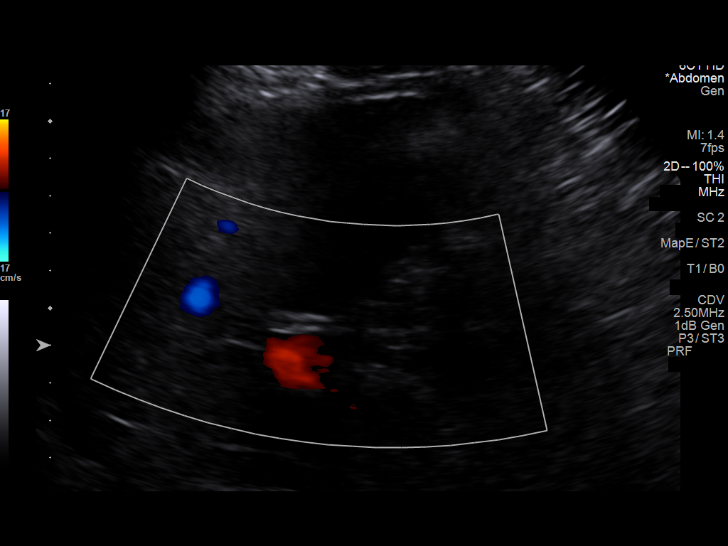
[im 19/50]
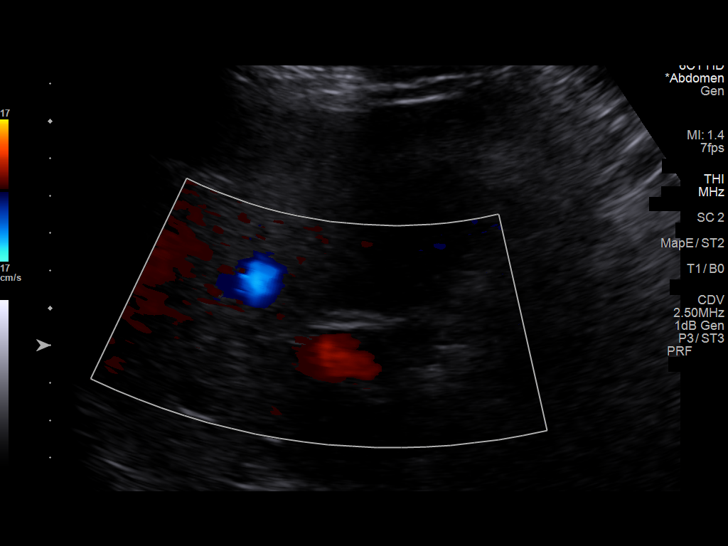
[im 23/50]
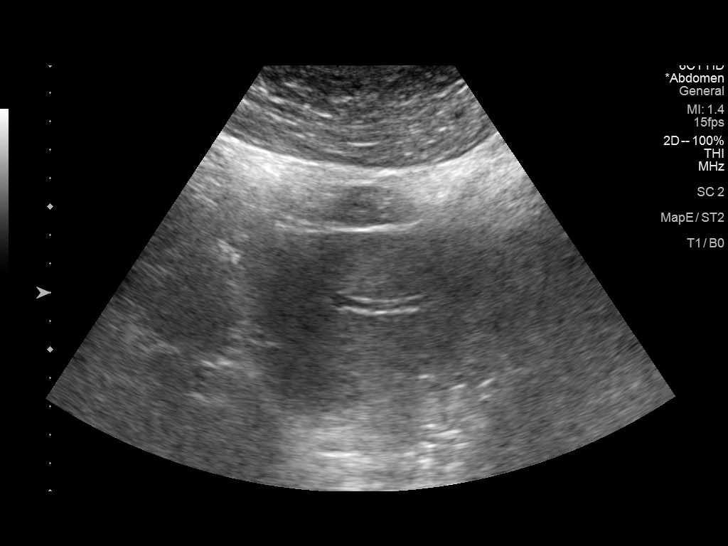
[im 27/50]
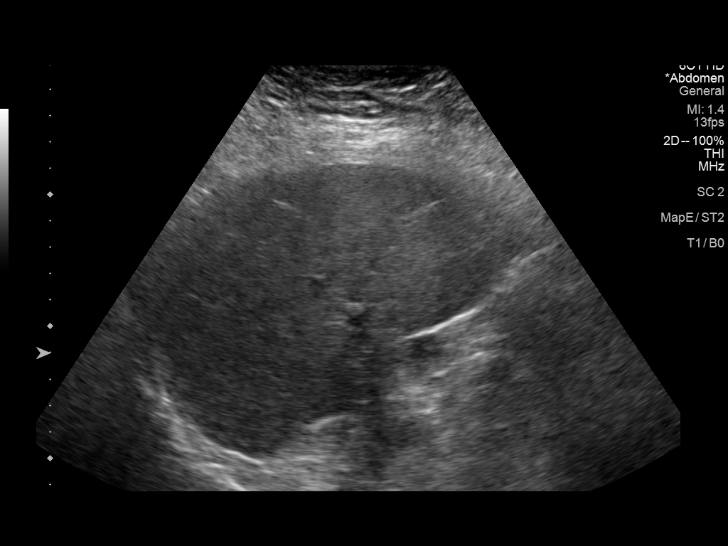
[im 31/50]
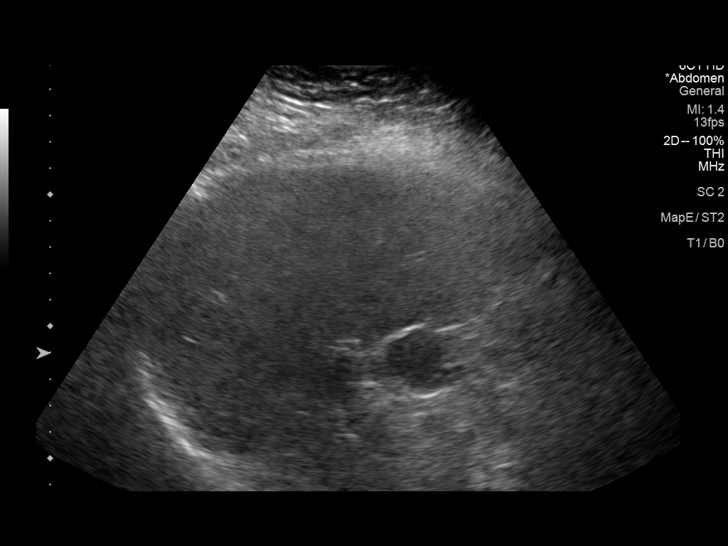
[im 33/50]
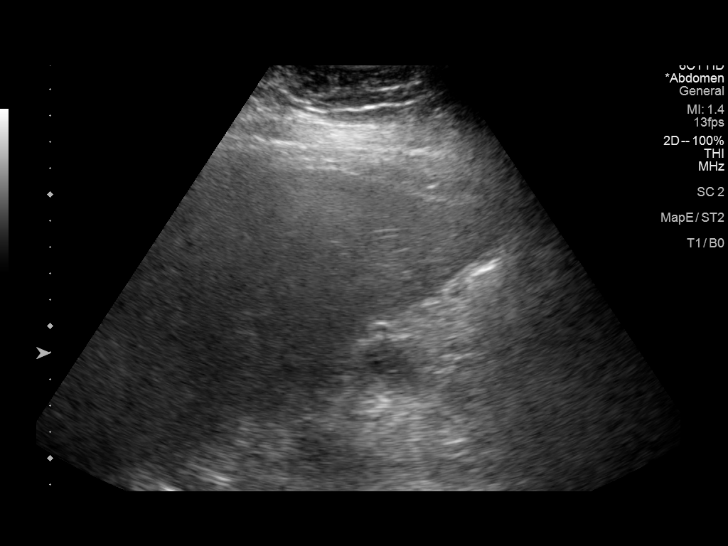
[im 37/50]
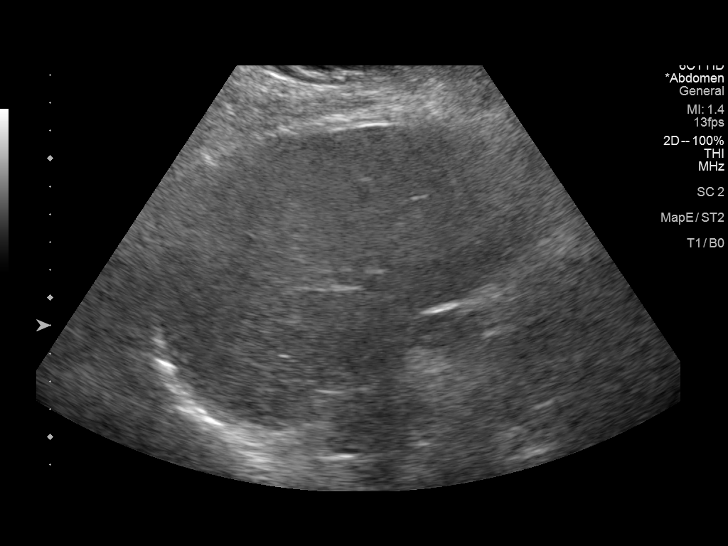
[im 41/50]
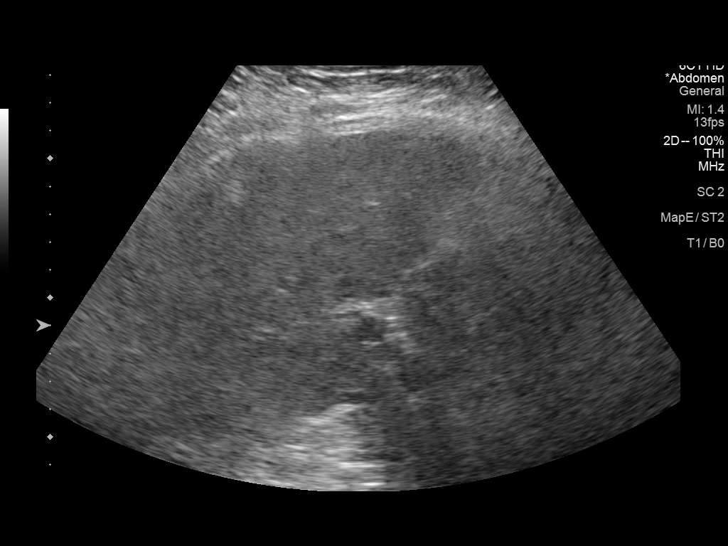
[im 45/50]
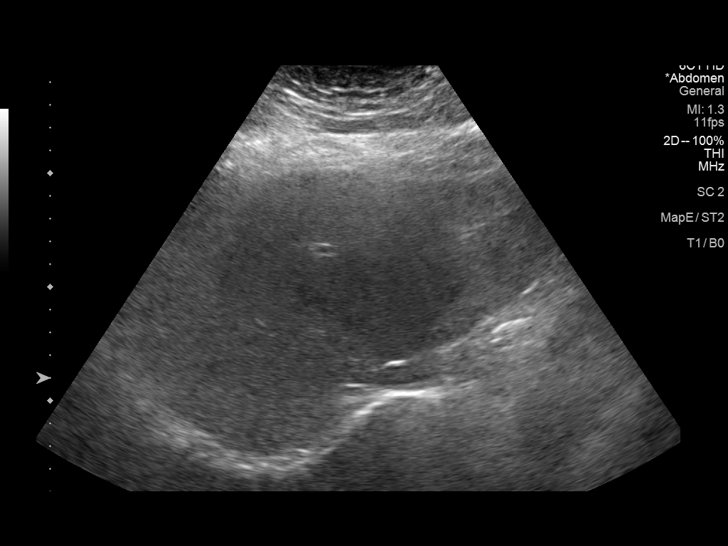
[im 50/50]
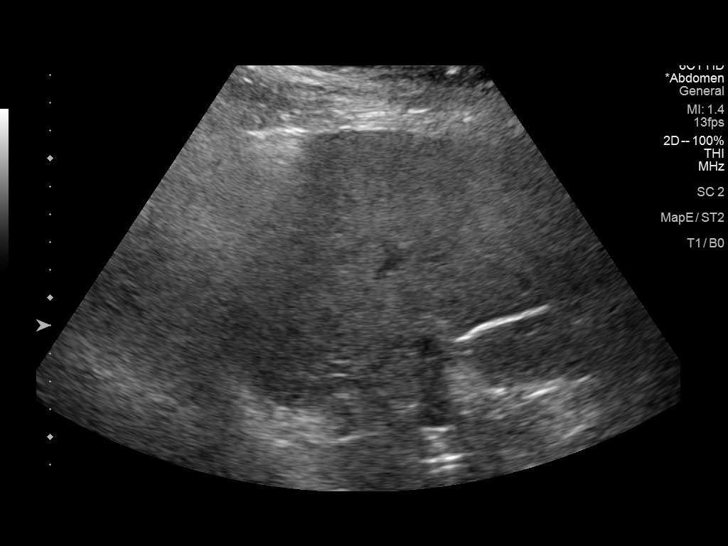

[14 of 25 positions shown; findings below may reference images not displayed]

FINDINGS: Gallbladder:

No gallstones or wall thickening visualized. No sonographic Murphy
sign noted by sonographer.

Common bile duct:

Diameter: 3.8 mm

Liver:

Diffusely heterogeneous with nodularity and increased echogenicity
consistent with the given clinical history of cirrhosis. No focal
mass is noted. Portal vein is patent on color Doppler imaging with
normal direction of blood flow towards the liver.

Other: None.
IMPRESSION: Changes consistent with cirrhosis without focal mass.

## 2020-12-25 ENCOUNTER — Encounter (HOSPITAL_COMMUNITY): Payer: Self-pay

## 2020-12-25 ENCOUNTER — Other Ambulatory Visit: Payer: Self-pay

## 2020-12-25 ENCOUNTER — Emergency Department (HOSPITAL_COMMUNITY)
Admission: EM | Admit: 2020-12-25 | Discharge: 2020-12-26 | Disposition: A | Discharge status: Home / Self Care | Payer: Medicaid Other | Attending: Emergency Medicine | Admitting: Emergency Medicine

## 2020-12-25 ENCOUNTER — Emergency Department (HOSPITAL_COMMUNITY): Payer: Medicaid Other

## 2020-12-25 DIAGNOSIS — I10 Essential (primary) hypertension: Secondary | ICD-10-CM | POA: Diagnosis not present

## 2020-12-25 DIAGNOSIS — R531 Weakness: Secondary | ICD-10-CM | POA: Diagnosis present

## 2020-12-25 DIAGNOSIS — R0789 Other chest pain: Secondary | ICD-10-CM | POA: Diagnosis not present

## 2020-12-25 DIAGNOSIS — Z20822 Contact with and (suspected) exposure to covid-19: Secondary | ICD-10-CM | POA: Insufficient documentation

## 2020-12-25 DIAGNOSIS — G459 Transient cerebral ischemic attack, unspecified: Secondary | ICD-10-CM | POA: Insufficient documentation

## 2020-12-25 DIAGNOSIS — Z87891 Personal history of nicotine dependence: Secondary | ICD-10-CM | POA: Diagnosis not present

## 2020-12-25 DIAGNOSIS — F444 Conversion disorder with motor symptom or deficit: Secondary | ICD-10-CM | POA: Diagnosis not present

## 2020-12-25 LAB — CBC
HCT: 43.9 % (ref 36.0–46.0)
Hemoglobin: 14.9 g/dL (ref 12.0–15.0)
MCH: 30 pg (ref 26.0–34.0)
MCHC: 33.9 g/dL (ref 30.0–36.0)
MCV: 88.3 fL (ref 80.0–100.0)
Platelets: 279 10*3/uL (ref 150–400)
RBC: 4.97 MIL/uL (ref 3.87–5.11)
RDW: 14.8 % (ref 11.5–15.5)
WBC: 9.6 10*3/uL (ref 4.0–10.5)
nRBC: 0 % (ref 0.0–0.2)

## 2020-12-25 LAB — PROTIME-INR
INR: 1 (ref 0.8–1.2)
Prothrombin Time: 13.1 seconds (ref 11.4–15.2)

## 2020-12-25 LAB — DIFFERENTIAL
Abs Immature Granulocytes: 0.03 10*3/uL (ref 0.00–0.07)
Basophils Absolute: 0.1 10*3/uL (ref 0.0–0.1)
Basophils Relative: 1 %
Eosinophils Absolute: 0.4 10*3/uL (ref 0.0–0.5)
Eosinophils Relative: 4 %
Immature Granulocytes: 0 %
Lymphocytes Relative: 31 %
Lymphs Abs: 3 10*3/uL (ref 0.7–4.0)
Monocytes Absolute: 0.7 10*3/uL (ref 0.1–1.0)
Monocytes Relative: 7 %
Neutro Abs: 5.5 10*3/uL (ref 1.7–7.7)
Neutrophils Relative %: 57 %

## 2020-12-25 LAB — APTT: aPTT: 28 seconds (ref 24–36)

## 2020-12-25 LAB — I-STAT BETA HCG BLOOD, ED (MC, WL, AP ONLY): I-stat hCG, quantitative: <5 m[IU]/mL (ref <5)

## 2020-12-25 LAB — COMPREHENSIVE METABOLIC PANEL
ALT: 24 U/L (ref 0–44)
AST: 24 U/L (ref 15–41)
Albumin: 4 g/dL (ref 3.5–5.0)
Alkaline Phosphatase: 83 U/L (ref 38–126)
Anion gap: 14 (ref 5–15)
BUN: 9 mg/dL (ref 6–20)
CO2: 22 mmol/L (ref 22–32)
Calcium: 9.3 mg/dL (ref 8.9–10.3)
Chloride: 101 mmol/L (ref 98–111)
Creatinine, Ser: 0.82 mg/dL (ref 0.44–1.00)
GFR, Estimated: >60 mL/min (ref >60)
Glucose, Bld: 83 mg/dL (ref 70–99)
Potassium: 3.5 mmol/L (ref 3.5–5.1)
Sodium: 137 mmol/L (ref 135–145)
Total Bilirubin: 0.6 mg/dL (ref 0.3–1.2)
Total Protein: 7.9 g/dL (ref 6.5–8.1)

## 2020-12-25 LAB — I-STAT CHEM 8, ED
BUN: 9 mg/dL (ref 6–20)
Calcium, Ion: 1.07 mmol/L — ABNORMAL LOW (ref 1.15–1.40)
Chloride: 102 mmol/L (ref 98–111)
Creatinine, Ser: 1.1 mg/dL — ABNORMAL HIGH (ref 0.44–1.00)
Glucose, Bld: 82 mg/dL (ref 70–99)
HCT: 45 % (ref 36.0–46.0)
Hemoglobin: 15.3 g/dL — ABNORMAL HIGH (ref 12.0–15.0)
Potassium: 3.6 mmol/L (ref 3.5–5.1)
Sodium: 140 mmol/L (ref 135–145)
TCO2: 22 mmol/L (ref 22–32)

## 2020-12-25 LAB — CBG MONITORING, ED: Glucose-Capillary: 75 mg/dL (ref 70–99)

## 2020-12-25 IMAGING — CT CT HEAD CODE STROKE
4 series · 15 of 47 positions shown, 17 images · IV contrast (omnipaque)
Comparison: None.

CLINICAL DATA: Acute neurologic deficit

EXAM:
CT HEAD WITHOUT CONTRAST
CT ANGIOGRAPHY OF THE HEAD AND NECK
TECHNIQUE: Contiguous axial images were obtained from the base of the skull
through the vertex without intravenous contrast. Multidetector CT
imaging of the head and neck was performed using the standard
protocol during bolus administration of intravenous contrast.
Multiplanar CT image reconstructions and MIPs were obtained to
evaluate the vascular anatomy. Carotid stenosis measurements (when
applicable) are obtained utilizing NASCET criteria, using the distal
internal carotid diameter as the denominator.
CONTRAST:  100mL OMNIPAQUE IOHEXOL 350 MG/ML SOLN

[Series 2: head wo · axial · 0.45mm/px · z∈[-184,-60]mm · 7 of 35 slices shown, 9 images]
[im 5/35  brain]
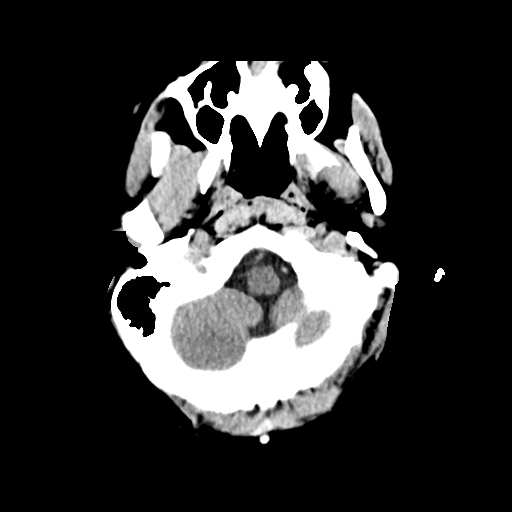
[im 5/35  bone]
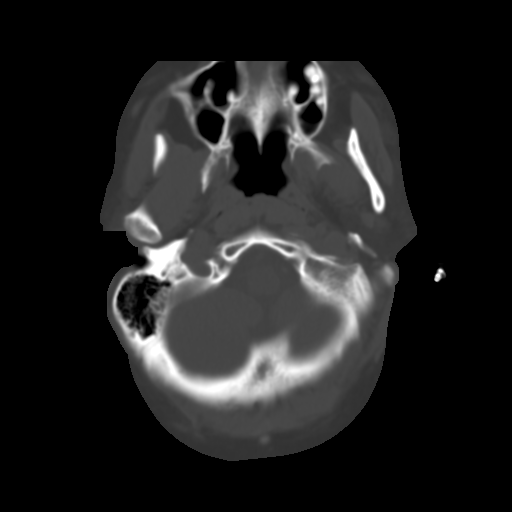
[im 9/35  brain]
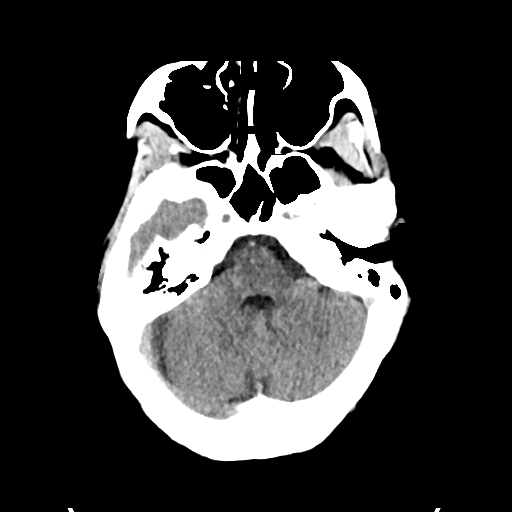
[im 13/35  brain]
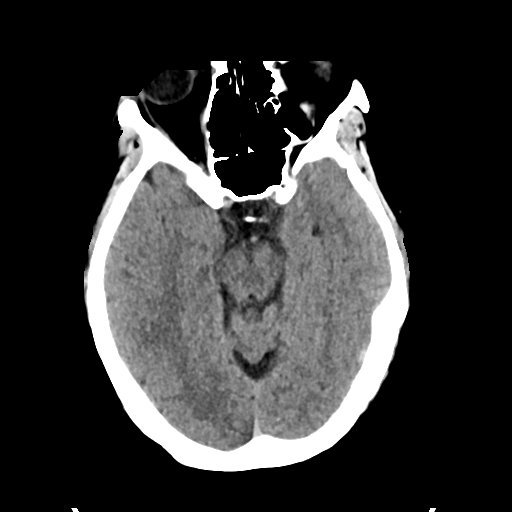
[im 18/35  brain]
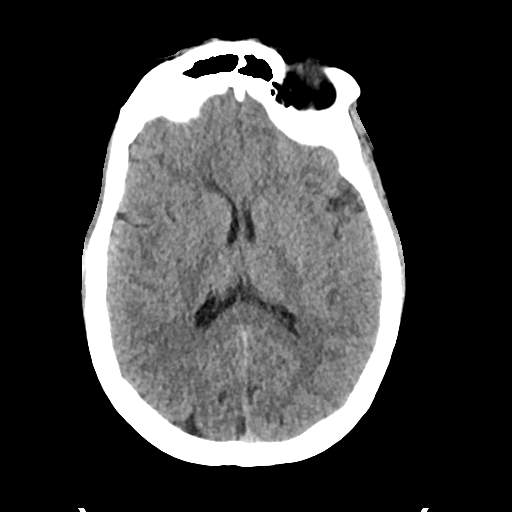
[im 22/35  brain]
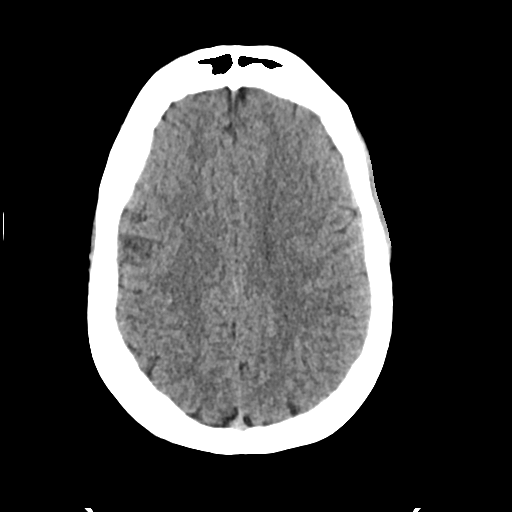
[im 22/35  bone]
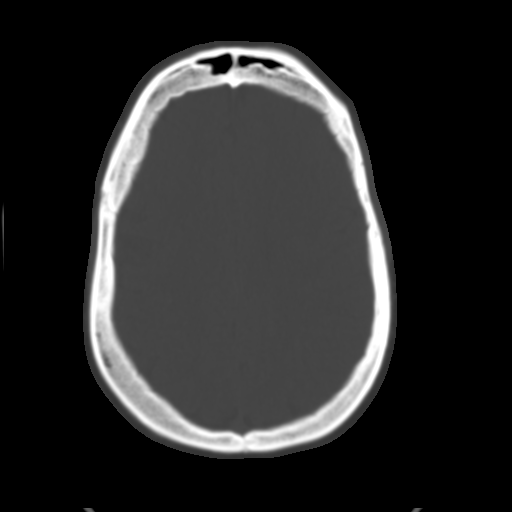
[im 26/35  brain]
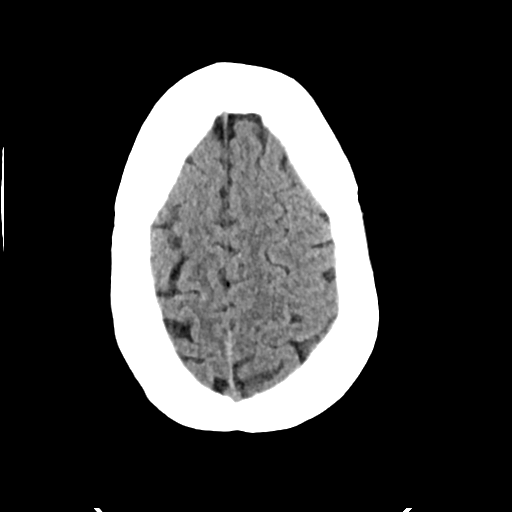
[im 30/35  brain]
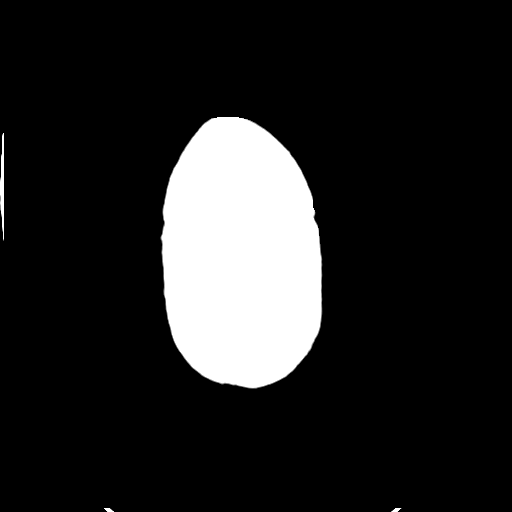

[Series 3: head bone · axial · 0.45mm/px · z∈[-188,-170]mm · 2 of 86 slices shown]
[im 9/86  bone]
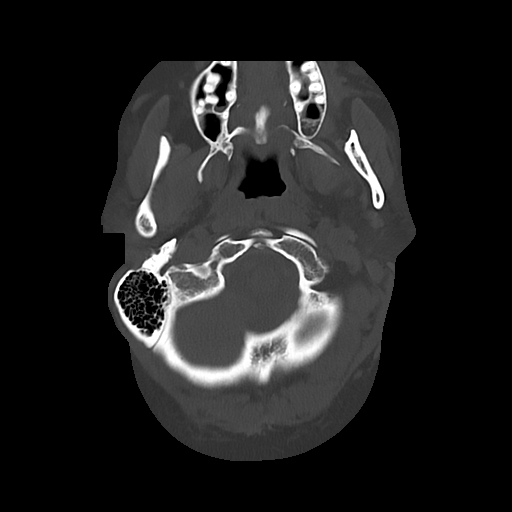
[im 18/86  bone]
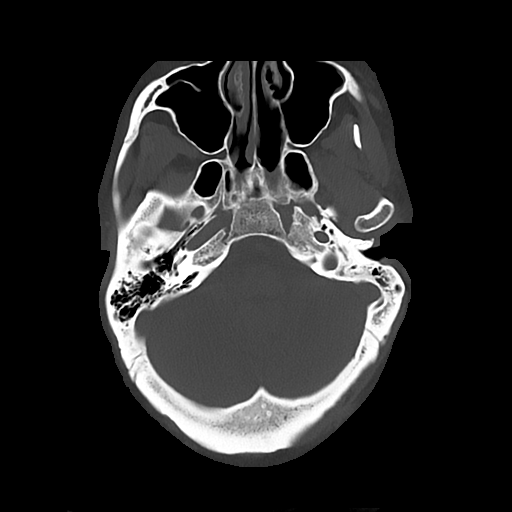

[Series 5: cor soft · coronal · 0.35mm/px · 3 of 75 slices shown]
[im 25/75  brain]
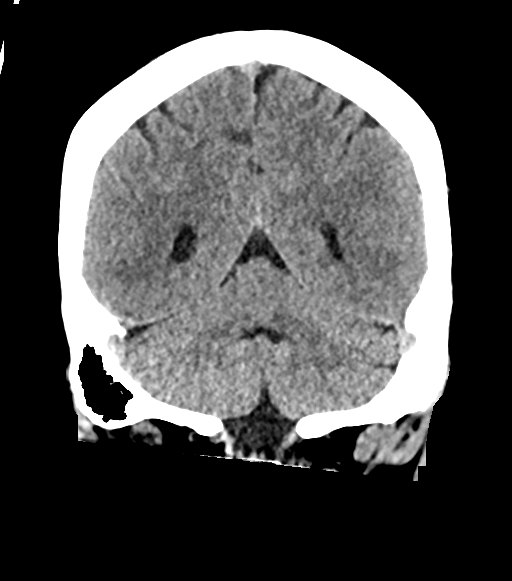
[im 33/75  brain]
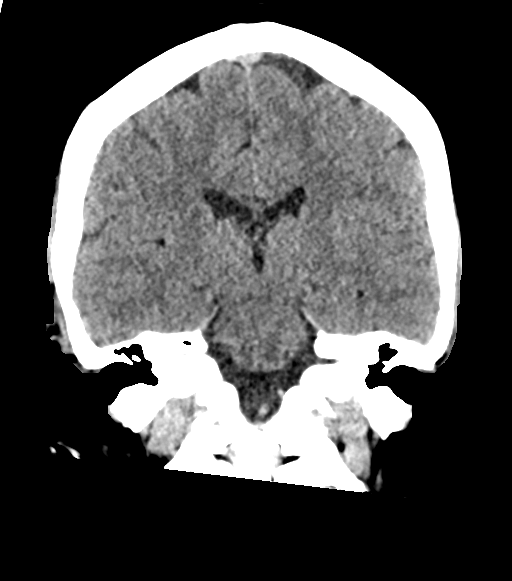
[im 42/75  brain]
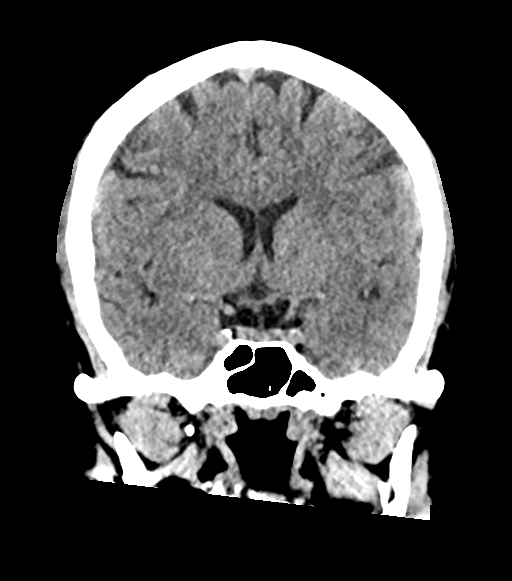

[Series 8: sag soft · sagittal · 0.40mm/px · 3 of 59 slices shown]
[im 20/59  brain]
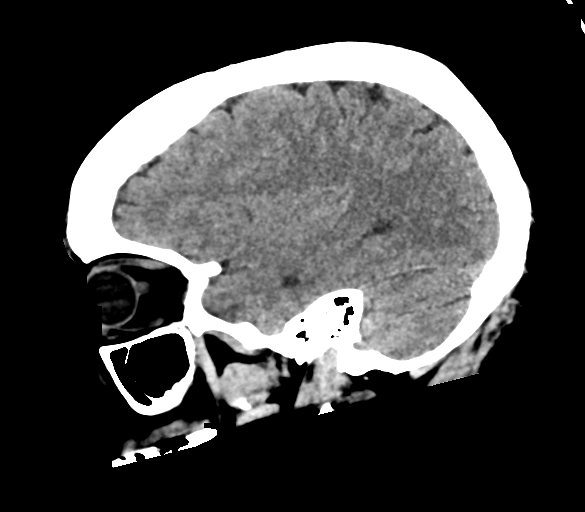
[im 30/59  brain]
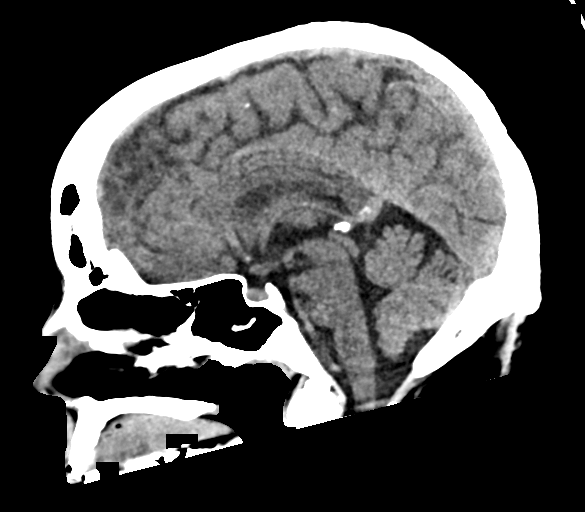
[im 39/59  brain]
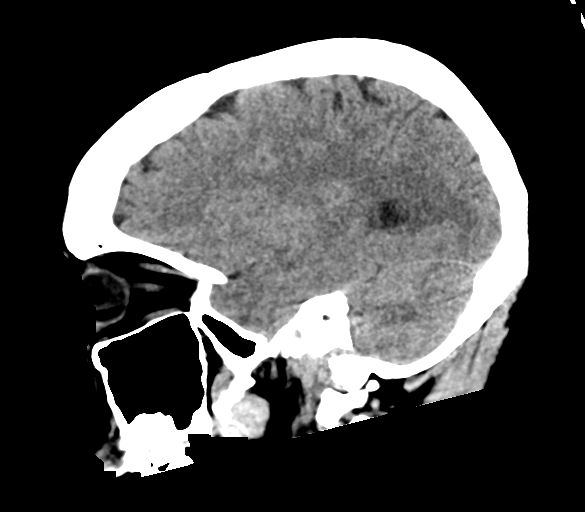

[15 of 47 positions shown; findings below may reference images not displayed]

FINDINGS: CT HEAD

Brain: There is no mass, hemorrhage or extra-axial collection. The
size and configuration of the ventricles and extra-axial CSF spaces
are normal. The brain parenchyma is normal, without evidence of
acute or chronic infarction.

Vascular: No abnormal hyperdensity of the major intracranial
arteries or dural venous sinuses. No intracranial atherosclerosis.

Skull: The visualized skull base, calvarium and extracranial soft
tissues are normal.

Sinuses/Orbits: No fluid levels or advanced mucosal thickening of
the visualized paranasal sinuses. No mastoid or middle ear effusion.
The orbits are normal.

ASPECTS (Alberta Stroke Program Early CT Score)

- Ganglionic level infarction (caudate, lentiform nuclei, internal
capsule, insula, M1-M3 cortex): 7

- Supraganglionic infarction (M4-M6 cortex): 3

Total score (0-10 with 10 being normal): 10

CTA NECK FINDINGS

SKELETON: There is no bony spinal canal stenosis. No lytic or
blastic lesion.

OTHER NECK: Normal pharynx, larynx and major salivary glands. No
cervical lymphadenopathy. Unremarkable thyroid gland.

UPPER CHEST: No pneumothorax or pleural effusion. No nodules or
masses.

AORTIC ARCH:

There is no calcific atherosclerosis of the aortic arch. There is no
aneurysm, dissection or hemodynamically significant stenosis of the
visualized portion of the aorta. Conventional 3 vessel aortic
branching pattern. The visualized proximal subclavian arteries are
widely patent.

RIGHT CAROTID SYSTEM: Normal without aneurysm, dissection or
stenosis.

LEFT CAROTID SYSTEM: Normal without aneurysm, dissection or
stenosis.

VERTEBRAL ARTERIES: Left dominant configuration. Both origins are
clearly patent. There is no dissection, occlusion or flow-limiting
stenosis to the skull base (V1-V3 segments).

CTA HEAD FINDINGS

POSTERIOR CIRCULATION:

--Vertebral arteries: Normal V4 segments.

--Inferior cerebellar arteries: Normal.

--Basilar artery: Normal.

--Superior cerebellar arteries: Normal.

--Posterior cerebral arteries (PCA): Normal.

ANTERIOR CIRCULATION:

--Intracranial internal carotid arteries: Normal.

--Anterior cerebral arteries (ACA): Normal. Both A1 segments are
present. Patent anterior communicating artery (a-comm).

--Middle cerebral arteries (MCA): Normal.

VENOUS SINUSES: As permitted by contrast timing, patent.

ANATOMIC VARIANTS: None

Review of the MIP images confirms the above findings.
IMPRESSION: 1. Normal CTA of the head and neck.
2. ASPECTS is 10.
3. No emergent large vessel occlusion.

These results were communicated to Dr. TIC-TAC at [DATE] on [DATE] by text page via the [employer] messaging system.

## 2020-12-25 IMAGING — CT CT ANGIO HEAD-NECK (W OR W/O PERF)
2 of 7 series · 8 of 33 positions shown · IV contrast (APPLIED)
Comparison: None.

CLINICAL DATA: Acute neurologic deficit

EXAM:
CT HEAD WITHOUT CONTRAST
CT ANGIOGRAPHY OF THE HEAD AND NECK
TECHNIQUE: Contiguous axial images were obtained from the base of the skull
through the vertex without intravenous contrast. Multidetector CT
imaging of the head and neck was performed using the standard
protocol during bolus administration of intravenous contrast.
Multiplanar CT image reconstructions and MIPs were obtained to
evaluate the vascular anatomy. Carotid stenosis measurements (when
applicable) are obtained utilizing NASCET criteria, using the distal
internal carotid diameter as the denominator.
CONTRAST:  100mL OMNIPAQUE IOHEXOL 350 MG/ML SOLN

[Series 10: cta neck/head · axial · 0.42mm/px · z∈[-274,-150]mm · 2 of 187 slices shown]
[im 63/187  soft-tissue]
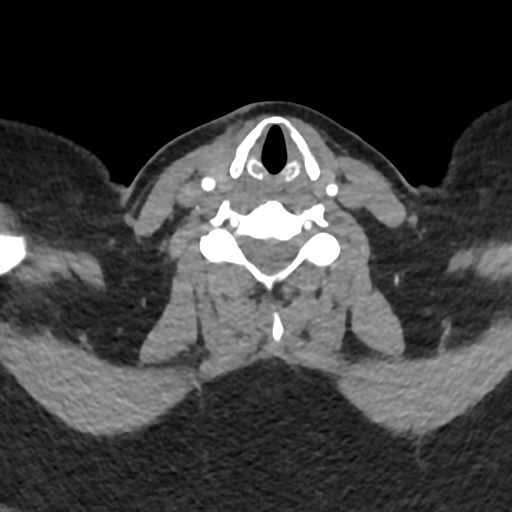
[im 125/187  soft-tissue]
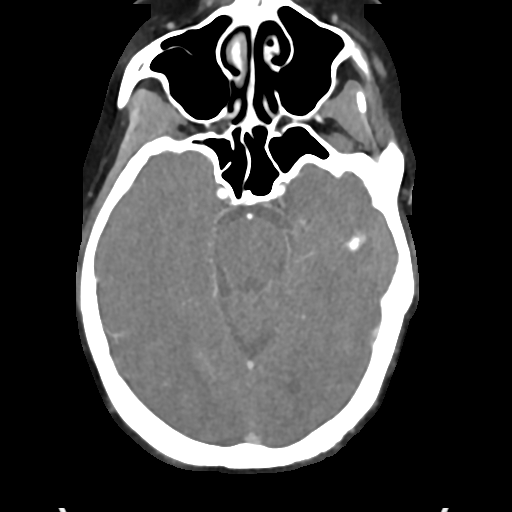

[Series 12: ax thins · axial · 0.41mm/px · z∈[-345,-79]mm · 6 of 374 slices shown]
[im 54/374  soft-tissue]
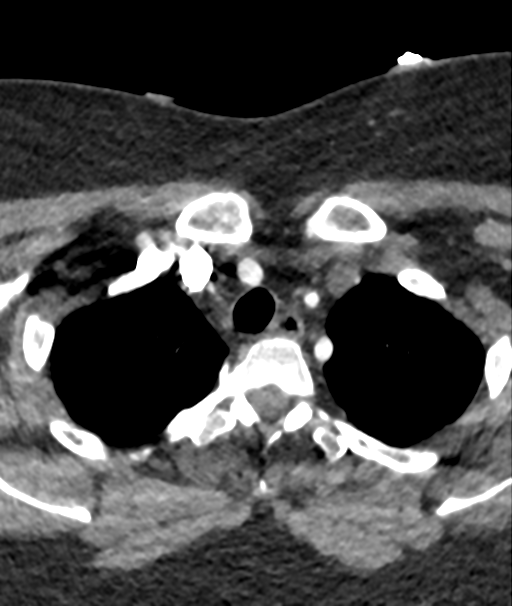
[im 107/374  bone]
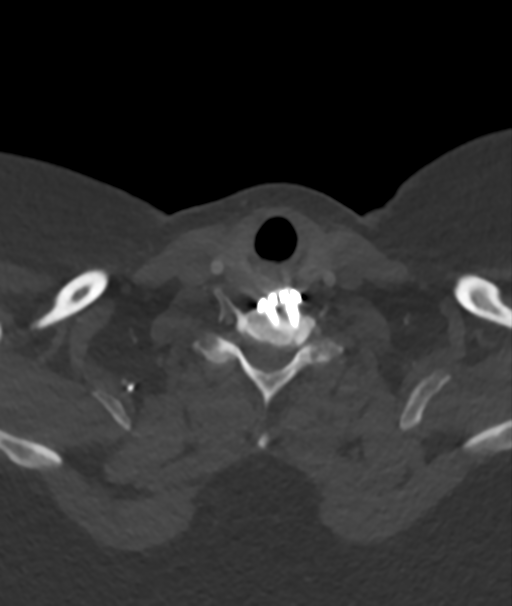
[im 160/374  soft-tissue]
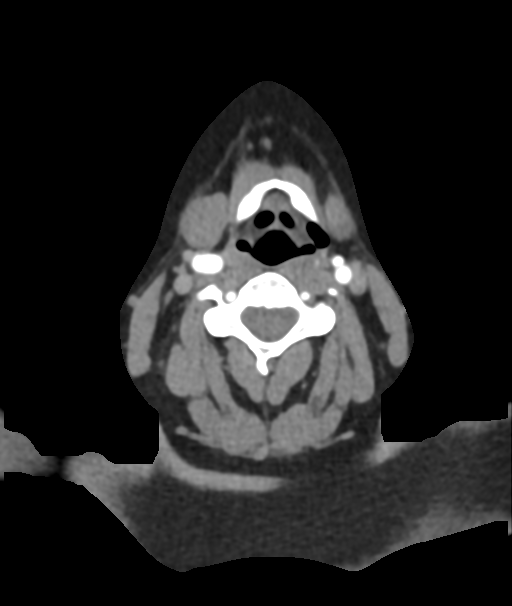
[im 214/374  bone]
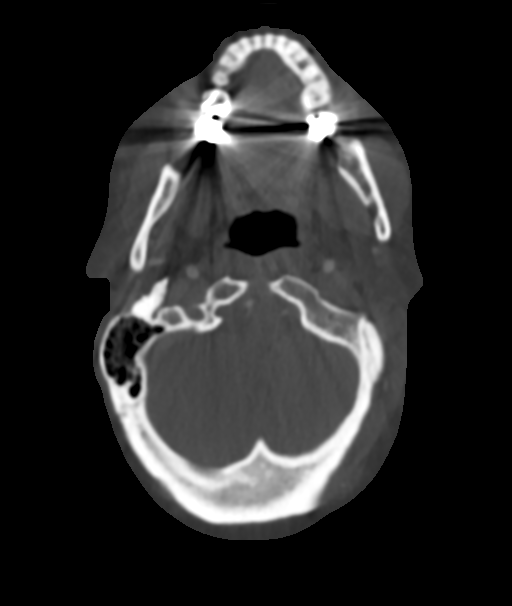
[im 267/374  soft-tissue]
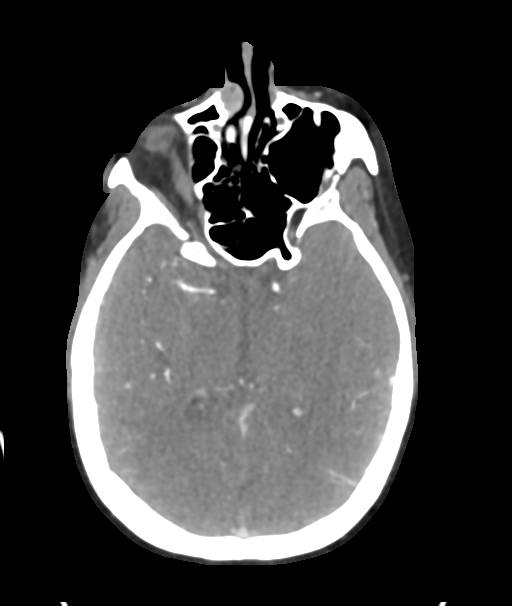
[im 320/374  bone]
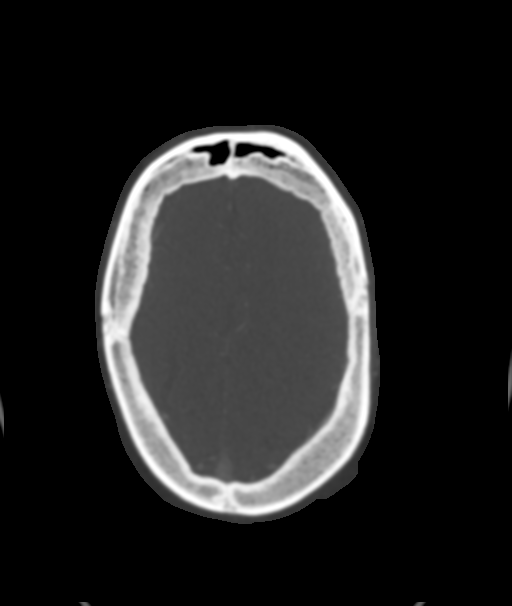

[8 of 33 positions shown; findings below may reference images not displayed]

FINDINGS: CT HEAD

Brain: There is no mass, hemorrhage or extra-axial collection. The
size and configuration of the ventricles and extra-axial CSF spaces
are normal. The brain parenchyma is normal, without evidence of
acute or chronic infarction.

Vascular: No abnormal hyperdensity of the major intracranial
arteries or dural venous sinuses. No intracranial atherosclerosis.

Skull: The visualized skull base, calvarium and extracranial soft
tissues are normal.

Sinuses/Orbits: No fluid levels or advanced mucosal thickening of
the visualized paranasal sinuses. No mastoid or middle ear effusion.
The orbits are normal.

ASPECTS (Alberta Stroke Program Early CT Score)

- Ganglionic level infarction (caudate, lentiform nuclei, internal
capsule, insula, M1-M3 cortex): 7

- Supraganglionic infarction (M4-M6 cortex): 3

Total score (0-10 with 10 being normal): 10

CTA NECK FINDINGS

SKELETON: There is no bony spinal canal stenosis. No lytic or
blastic lesion.

OTHER NECK: Normal pharynx, larynx and major salivary glands. No
cervical lymphadenopathy. Unremarkable thyroid gland.

UPPER CHEST: No pneumothorax or pleural effusion. No nodules or
masses.

AORTIC ARCH:

There is no calcific atherosclerosis of the aortic arch. There is no
aneurysm, dissection or hemodynamically significant stenosis of the
visualized portion of the aorta. Conventional 3 vessel aortic
branching pattern. The visualized proximal subclavian arteries are
widely patent.

RIGHT CAROTID SYSTEM: Normal without aneurysm, dissection or
stenosis.

LEFT CAROTID SYSTEM: Normal without aneurysm, dissection or
stenosis.

VERTEBRAL ARTERIES: Left dominant configuration. Both origins are
clearly patent. There is no dissection, occlusion or flow-limiting
stenosis to the skull base (V1-V3 segments).

CTA HEAD FINDINGS

POSTERIOR CIRCULATION:

--Vertebral arteries: Normal V4 segments.

--Inferior cerebellar arteries: Normal.

--Basilar artery: Normal.

--Superior cerebellar arteries: Normal.

--Posterior cerebral arteries (PCA): Normal.

ANTERIOR CIRCULATION:

--Intracranial internal carotid arteries: Normal.

--Anterior cerebral arteries (ACA): Normal. Both A1 segments are
present. Patent anterior communicating artery (a-comm).

--Middle cerebral arteries (MCA): Normal.

VENOUS SINUSES: As permitted by contrast timing, patent.

ANATOMIC VARIANTS: None

Review of the MIP images confirms the above findings.
IMPRESSION: 1. Normal CTA of the head and neck.
2. ASPECTS is 10.
3. No emergent large vessel occlusion.

These results were communicated to Dr. TIC-TAC at [DATE] on [DATE] by text page via the [employer] messaging system.

## 2020-12-25 MED ORDER — SODIUM CHLORIDE 0.9% FLUSH
3.0000 mL | Freq: Once | INTRAVENOUS | Status: AC
  Administered 2020-12-25: 3 mL via INTRAVENOUS

## 2020-12-25 MED ORDER — IOHEXOL 350 MG/ML SOLN
100.0000 mL | Freq: Once | INTRAVENOUS | Status: AC | PRN
  Administered 2020-12-25: 100 mL via INTRAVENOUS

## 2020-12-25 NOTE — ED Provider Notes (Signed)
[facility] EMERGENCY DEPARTMENT Provider Note   CSN: [id number] Arrival date & time: 12/25/20  2306  An emergency department physician performed an initial assessment on this suspected stroke patient at 2306.  History Chief Complaint  Patient presents with   Extremity Weakness    Megan [name] is a 40 y.o. female.  40 yo  F with a cc of L sided weakness.  Reported by EMS enroute to the hospital, also reportedly had a period of apnea and required respiratory support.  Now alert.  Level V caveat acuity of condition.   The history is provided by the patient and the EMS personnel.      Past Medical History:  Diagnosis Date   Cirrhosis (HCC)    Diverticulosis    Dyslipidemia    GERD (gastroesophageal reflux disease)    H/O ileostomy    Hepatitis C    s/p treatment   Hypercholesteremia    Obesity    PCOS (polycystic ovarian syndrome)    PTSD (post-traumatic stress disorder)    Steatosis (HCC)     Patient Active Problem List   Diagnosis Date Noted   Varicose veins of leg with pain, bilateral 09/17/2020   Tobacco dependence 08/20/2020   Left arm swelling 08/20/2020   Lymphedema 08/20/2020   Fatty liver 05/22/2020   Incisional hernia of anterior abdominal wall without obstruction or gangrene 09/29/2019   History of Clostridioides difficile infection 09/29/2019   H/O ileostomy 09/29/2019   Diverticulitis of sigmoid colon 09/29/2019   UTI due to extended-spectrum beta lactamase (ESBL) producing Escherichia coli 05/03/2019   Lumbar radiculopathy 05/02/2019   PTSD (post-traumatic stress disorder) 05/02/2019   PCOS (polycystic ovarian syndrome) 05/02/2019   Body mass index (BMI) of 40.0 to 44.9 in adult (HCC) 05/02/2019   Gram-negative bacteremia 05/02/2019   Gastro-esophageal reflux disease without esophagitis 05/02/2019   Essential (primary) hypertension 05/02/2019   Dyslipidemia 05/02/2019   Chronic hepatitis C virus infection (HCC) 05/02/2019    Bipolar 1 disorder (HCC) 12/23/2018   Anxiety 12/23/2018   Diverticulitis 10/14/2018   Herniated lumbar intervertebral disc 07/14/2018   Sciatica 07/07/2018   History of hepatitis C 11/22/2015   Tobacco abuse 10/10/2013   Pyelonephritis 10/10/2013    Past Surgical History:  Procedure Laterality Date   ANTERIOR CERVICAL DECOMP/DISCECTOMY FUSION N/A 08/10/2020   Procedure: Cervical Six-Seven Anterior cervical decompression/discectomy/fusion;  Surgeon: Jadene Pierini, MD;  Location: [facility] OR;  Service: Neurosurgery;  Laterality: N/A;  Cervical Six-Seven Anterior cervical decompression/discectomy/fusion   BACK SURGERY     COLON SURGERY  09/2018   @ [facility]; and again 12/2018   FRACTURE SURGERY     left hand   HAND SURGERY Left    HERNIA REPAIR  09/2019   Incisional hernia repair   KNEE ARTHROSCOPY Right    LIVER BIOPSY     LUMBAR LAMINECTOMY/DECOMPRESSION MICRODISCECTOMY N/A 07/15/2018   Procedure: L5-S1 MINIMALLY INVASIVE LUMBAR DISCECTOMY;  Surgeon: Jadene Pierini, MD;  Location: [facility] OR;  Service: Neurosurgery;  Laterality: N/A;   SHOULDER ARTHROSCOPY WITH LABRAL REPAIR Left 04/20/2020   Procedure: LEFT SHOULDER ARTHROSCOPY WITH LABRAL REPAIR, SUBACROMIAL DEBRIDEMENT;  Surgeon: Juanell Fairly, MD;  Location: [facility] ORS;  Service: Orthopedics;  Laterality: Left;   TONSILLECTOMY     UPPER EXTREMITY VENOGRAPHY Left 08/26/2020   Procedure: UPPER EXTREMITY VENOGRAPHY;  Surgeon: Annice Needy, MD;  Location: [facility] INVASIVE CV LAB;  Service: Cardiovascular;  Laterality: Left;     OB History   No obstetric history on file.  Family History  Problem Relation Age of Onset   High blood pressure Mother    Heart attack Father    High blood pressure Father    Heart attack Paternal Grandfather        41s   Colon cancer Maternal Grandfather    Breast cancer Maternal Aunt     Social History   Tobacco Use   Smoking status: Former    Packs/day: 0.50    Years: 15.00    Pack years:  7.50    Types: Cigarettes    Quit date: 03/30/2015    Years since quitting: 5.7   Smokeless tobacco: Never  Vaping Use   Vaping Use: Never used  Substance Use Topics   Alcohol use: Not Currently    Comment: social   Drug use: No    Home Medications Prior to Admission medications   Medication Sig Start Date End Date Taking? Authorizing Provider  atorvastatin (LIPITOR) 40 MG tablet Take 40 mg by mouth daily.    [provider]  celecoxib (CELEBREX) 200 MG capsule Take 200 mg by mouth 2 (two) times daily. 07/10/20   [provider]  cyclobenzaprine (FLEXERIL) 5 MG tablet Take 1 tablet (5 mg total) by mouth 3 (three) times daily as needed (muscle pain/aches). 02/08/20   Phineas Semen, MD  diazepam (VALIUM) 5 MG tablet Take 5 mg by mouth daily as needed for anxiety. 06/25/19   [provider]  meloxicam (MOBIC) 7.5 MG tablet meloxicam 7.5 mg tablet  Take 1 tablet twice a day by oral route with meals.    [provider]  Netarsudil-Latanoprost (ROCKLATAN) 0.02-0.005 % SOLN Place 1 drop into both eyes at bedtime.    [provider]  omeprazole (PRILOSEC) 20 MG capsule Take 20 mg by mouth daily.     [provider]  ondansetron (ZOFRAN) 4 MG tablet Take 1 tablet (4 mg total) by mouth every 8 (eight) hours as needed for nausea or vomiting. 04/20/20   Juanell Fairly, MD  ondansetron (ZOFRAN) 4 MG tablet Take 1 tablet (4 mg total) by mouth every 6 (six) hours as needed for nausea or vomiting. 08/11/20   Jadene Pierini, MD  oxyCODONE 10 MG TABS Take 1 tablet (10 mg total) by mouth every 4 (four) hours as needed for severe pain ((score 7 to 10)). 08/11/20   Jadene Pierini, MD  timolol (TIMOPTIC) 0.5 % ophthalmic solution Place 1 drop into both eyes 2 (two) times daily. 12/16/18   [provider]    Allergies    Levofloxacin, Lisinopril, Varenicline, Clindamycin, and Penicillins  Review of Systems   Review of Systems  Unable to  perform ROS: Acuity of condition   Physical Exam Updated Vital Signs BP 112/78 (BP Location: Right Arm)   Pulse (!) 101   Temp 98.1 F (36.7 C) (Oral)   Resp 18   SpO2 (!) 89%   Physical Exam Vitals and nursing note reviewed.  Constitutional:      General: She is not in acute distress.    Appearance: She is well-developed. She is not diaphoretic.  HENT:     Head: Normocephalic and atraumatic.  Eyes:     Pupils: Pupils are equal, round, and reactive to light.  Cardiovascular:     Rate and Rhythm: Normal rate and regular rhythm.     Heart sounds: No murmur heard.   No friction rub. No gallop.  Pulmonary:     Effort: Pulmonary effort is normal.  Breath sounds: No wheezing or rales.  Abdominal:     General: There is no distension.     Palpations: Abdomen is soft.     Tenderness: There is no abdominal tenderness.  Musculoskeletal:        General: No tenderness.     Cervical back: Normal range of motion and neck supple.  Skin:    General: Skin is warm and dry.  Neurological:     Mental Status: She is alert and oriented to person, place, and time.     Comments: L sided weakness  Psychiatric:        Behavior: Behavior normal.    ED Results / Procedures / Treatments   Labs (all labs ordered are listed, but only abnormal results are displayed) Labs Reviewed  I-STAT CHEM 8, ED - Abnormal; Notable for the following components:      Result Value   Creatinine, Ser 1.10 (*)    Calcium, Ion 1.07 (*)    Hemoglobin 15.3 (*)    All other components within normal limits  RESP PANEL BY RT-PCR (FLU A&B, COVID) ARPGX2  PROTIME-INR  APTT  CBC  DIFFERENTIAL  COMPREHENSIVE METABOLIC PANEL  AMMONIA  ETHANOL  RAPID URINE DRUG SCREEN, HOSP PERFORMED  CBG MONITORING, ED  I-STAT BETA HCG BLOOD, ED ([facility], [facility], [facility] ONLY)    EKG EKG Interpretation  Date/Time:  Saturday December 25 2020 23:37:59 EDT Ventricular Rate:  96 PR Interval:  112 QRS Duration: 77 QT Interval:  368 QTC  Calculation: 465 R Axis:   48 Text Interpretation: Sinus rhythm Borderline short PR interval Left atrial enlargement No significant change since last tracing Confirmed by Melene Plan [phone]) on 12/26/2020 12:10:39 AM  Radiology CT HEAD CODE STROKE WO CONTRAST  Result Date: 12/25/2020 CLINICAL DATA:  Acute neurologic deficit EXAM: CT HEAD WITHOUT CONTRAST CT ANGIOGRAPHY OF THE HEAD AND NECK TECHNIQUE: Contiguous axial images were obtained from the base of the skull through the vertex without intravenous contrast. Multidetector CT imaging of the head and neck was performed using the standard protocol during bolus administration of intravenous contrast. Multiplanar CT image reconstructions and MIPs were obtained to evaluate the vascular anatomy. Carotid stenosis measurements (when applicable) are obtained utilizing NASCET criteria, using the distal internal carotid diameter as the denominator. CONTRAST:  OMNIPAQUE IOHEXOL 350 MG/ML SOLN COMPARISON:  None. FINDINGS: CT HEAD Brain: There is no mass, hemorrhage or extra-axial collection. The size and configuration of the ventricles and extra-axial CSF spaces are normal. The brain parenchyma is normal, without evidence of acute or chronic infarction. Vascular: No abnormal hyperdensity of the major intracranial arteries or dural venous sinuses. No intracranial atherosclerosis. Skull: The visualized skull base, calvarium and extracranial soft tissues are normal. Sinuses/Orbits: No fluid levels or advanced mucosal thickening of the visualized paranasal sinuses. No mastoid or middle ear effusion. The orbits are normal. ASPECTS (Alberta Stroke Program Early CT Score) - Ganglionic level infarction (caudate, lentiform nuclei, internal capsule, insula, M1-M3 cortex): 7 - Supraganglionic infarction (M4-M6 cortex): 3 Total score (0-10 with 10 being normal): 10 CTA NECK FINDINGS SKELETON: There is no bony spinal canal stenosis. No lytic or blastic lesion. OTHER NECK:  Normal pharynx, larynx and major salivary glands. No cervical lymphadenopathy. Unremarkable thyroid gland. UPPER CHEST: No pneumothorax or pleural effusion. No nodules or masses. AORTIC ARCH: There is no calcific atherosclerosis of the aortic arch. There is no aneurysm, dissection or hemodynamically significant stenosis of the visualized portion of the aorta. Conventional 3 vessel aortic branching  pattern. The visualized proximal subclavian arteries are widely patent. RIGHT CAROTID SYSTEM: Normal without aneurysm, dissection or stenosis. LEFT CAROTID SYSTEM: Normal without aneurysm, dissection or stenosis. VERTEBRAL ARTERIES: Left dominant configuration. Both origins are clearly patent. There is no dissection, occlusion or flow-limiting stenosis to the skull base (V1-V3 segments). CTA HEAD FINDINGS POSTERIOR CIRCULATION: --Vertebral arteries: Normal V4 segments. --Inferior cerebellar arteries: Normal. --Basilar artery: Normal. --Superior cerebellar arteries: Normal. --Posterior cerebral arteries (PCA): Normal. ANTERIOR CIRCULATION: --Intracranial internal carotid arteries: Normal. --Anterior cerebral arteries (ACA): Normal. Both A1 segments are present. Patent anterior communicating artery (a-comm). --Middle cerebral arteries (MCA): Normal. VENOUS SINUSES: As permitted by contrast timing, patent. ANATOMIC VARIANTS: None Review of the MIP images confirms the above findings. IMPRESSION: 1. Normal CTA of the head and neck. 2. ASPECTS is 10. 3. No emergent large vessel occlusion. These results were communicated to Dr. Ritta Slot at 11:43 pm on 12/25/2020 by text page via the [facility] messaging system. Electronically Signed   By: Deatra Robinson M.D.   On: 12/25/2020 23:45   CT ANGIO HEAD NECK W WO CM (CODE STROKE)  Result Date: 12/25/2020 CLINICAL DATA:  Acute neurologic deficit EXAM: CT HEAD WITHOUT CONTRAST CT ANGIOGRAPHY OF THE HEAD AND NECK TECHNIQUE: Contiguous axial images were obtained from the base of the  skull through the vertex without intravenous contrast. Multidetector CT imaging of the head and neck was performed using the standard protocol during bolus administration of intravenous contrast. Multiplanar CT image reconstructions and MIPs were obtained to evaluate the vascular anatomy. Carotid stenosis measurements (when applicable) are obtained utilizing NASCET criteria, using the distal internal carotid diameter as the denominator. CONTRAST:  OMNIPAQUE IOHEXOL 350 MG/ML SOLN COMPARISON:  None. FINDINGS: CT HEAD Brain: There is no mass, hemorrhage or extra-axial collection. The size and configuration of the ventricles and extra-axial CSF spaces are normal. The brain parenchyma is normal, without evidence of acute or chronic infarction. Vascular: No abnormal hyperdensity of the major intracranial arteries or dural venous sinuses. No intracranial atherosclerosis. Skull: The visualized skull base, calvarium and extracranial soft tissues are normal. Sinuses/Orbits: No fluid levels or advanced mucosal thickening of the visualized paranasal sinuses. No mastoid or middle ear effusion. The orbits are normal. ASPECTS (Alberta Stroke Program Early CT Score) - Ganglionic level infarction (caudate, lentiform nuclei, internal capsule, insula, M1-M3 cortex): 7 - Supraganglionic infarction (M4-M6 cortex): 3 Total score (0-10 with 10 being normal): 10 CTA NECK FINDINGS SKELETON: There is no bony spinal canal stenosis. No lytic or blastic lesion. OTHER NECK: Normal pharynx, larynx and major salivary glands. No cervical lymphadenopathy. Unremarkable thyroid gland. UPPER CHEST: No pneumothorax or pleural effusion. No nodules or masses. AORTIC ARCH: There is no calcific atherosclerosis of the aortic arch. There is no aneurysm, dissection or hemodynamically significant stenosis of the visualized portion of the aorta. Conventional 3 vessel aortic branching pattern. The visualized proximal subclavian arteries are widely patent.  RIGHT CAROTID SYSTEM: Normal without aneurysm, dissection or stenosis. LEFT CAROTID SYSTEM: Normal without aneurysm, dissection or stenosis. VERTEBRAL ARTERIES: Left dominant configuration. Both origins are clearly patent. There is no dissection, occlusion or flow-limiting stenosis to the skull base (V1-V3 segments). CTA HEAD FINDINGS POSTERIOR CIRCULATION: --Vertebral arteries: Normal V4 segments. --Inferior cerebellar arteries: Normal. --Basilar artery: Normal. --Superior cerebellar arteries: Normal. --Posterior cerebral arteries (PCA): Normal. ANTERIOR CIRCULATION: --Intracranial internal carotid arteries: Normal. --Anterior cerebral arteries (ACA): Normal. Both A1 segments are present. Patent anterior communicating artery (a-comm). --Middle cerebral arteries (MCA): Normal. VENOUS SINUSES: As permitted by contrast  timing, patent. ANATOMIC VARIANTS: None Review of the MIP images confirms the above findings. IMPRESSION: 1. Normal CTA of the head and neck. 2. ASPECTS is 10. 3. No emergent large vessel occlusion. These results were communicated to Dr. Ritta Slot at 11:43 pm on 12/25/2020 by text page via the [facility] messaging system. Electronically Signed   By: Deatra Robinson M.D.   On: 12/25/2020 23:45    Procedures Procedures   Medications Ordered in ED Medications  ketorolac (TORADOL) 15 MG/ML injection 15 mg (has no administration in time range)  sodium chloride flush (NS) 0.9 % injection 3 mL (3 mLs Intravenous Given 12/25/20 2351)  iohexol (OMNIPAQUE) 350 MG/ML injection 100 mL (100 mLs Intravenous Contrast Given 12/25/20 2332)    ED Course  I have reviewed the triage vital signs and the nursing notes.  Pertinent labs & imaging results that were available during my care of the patient were reviewed by me and considered in my medical decision making (see chart for details).    MDM Rules/Calculators/A&P                           40 yo F arrived as a code stroke for slurred speech and L  sided weakness.  Hx of drug abuse, cocaine etoh yesterday.   Protecting airway on arrival.  Taken urgently to CT.   CT scan negative.  Neurology feels unlikely to be a stroke as her exam has changed on repetitive exams.  Weakness has alternated sides.  On my exam the patient is trying to show that her left side is drooped but changes drastically when I start talking to her about other things.  I think likely this is psychogenic.  Patient is also complaining of chest pain which is reproducible on exam.  She had lab work here that is unremarkable.  I do not feel any further work-up is warranted.  We will have the patient follow-up with her PCP and orthopedist.  12:14 AM:  I have discussed the diagnosis/risks/treatment options with the patient and believe the pt to be eligible for discharge home to follow-up with PCP. We also discussed returning to the ED immediately if new or worsening sx occur. We discussed the sx which are most concerning (e.g., sudden worsening pain, fever, inability to tolerate by mouth) that necessitate immediate return. Medications administered to the patient during their visit and any new prescriptions provided to the patient are listed below.  Medications given during this visit Medications  ketorolac (TORADOL) 15 MG/ML injection 15 mg (has no administration in time range)  sodium chloride flush (NS) 0.9 % injection 3 mL (3 mLs Intravenous Given 12/25/20 2351)  iohexol (OMNIPAQUE) 350 MG/ML injection 100 mL (100 mLs Intravenous Contrast Given 12/25/20 2332)     The patient appears reasonably screen and/or stabilized for discharge and I doubt any other medical condition or other [facility] requiring further screening, evaluation, or treatment in the ED at this time prior to discharge.   Final Clinical Impression(s) / ED Diagnoses Final diagnoses:  TIA (transient ischemic attack)  Left-sided chest wall pain    Rx / DC Orders ED Discharge Orders     None        Melene Plan, DO 12/26/20 0014

## 2020-12-25 NOTE — ED Triage Notes (Signed)
Pt presents to ED BIB [facility]. Pt presents as code storoke. LKW - 1700. Per EMS pt c/o CP intially but they noted slurred speech, L facial droop and L weakness. Per EMS pt went unresponsive for ~45sec, assisted ventilation. EMS bp 128 palp

## 2020-12-25 NOTE — Code Documentation (Incomplete)
Responded to Code Stroke called at 2230 for L sided weakness, facial droop, and slurred speech, LSN-1700. Pt arrived at 2306, NIH-15(though very inconsistent), CBG-75, CT head negative for acute changes. CTA-no LVO. TNK not given-outside window. Code Stroke cancelled at 2330.

## 2020-12-26 DIAGNOSIS — F444 Conversion disorder with motor symptom or deficit: Secondary | ICD-10-CM

## 2020-12-26 LAB — RESP PANEL BY RT-PCR (FLU A&B, COVID) ARPGX2
Influenza A by PCR: NEGATIVE
Influenza B by PCR: NEGATIVE
SARS Coronavirus 2 by RT PCR: NEGATIVE

## 2020-12-26 LAB — ETHANOL: Alcohol, Ethyl (B): 254 mg/dL — ABNORMAL HIGH (ref <10)

## 2020-12-26 LAB — AMMONIA: Ammonia: 10 umol/L (ref 9–35)

## 2020-12-26 MED ORDER — KETOROLAC TROMETHAMINE 15 MG/ML IJ SOLN
15.0000 mg | Freq: Once | INTRAMUSCULAR | Status: DC
  Filled 2020-12-26: qty 1

## 2020-12-26 NOTE — ED Notes (Signed)
This RN went into patient's room to administer her Toradol and to go over her d/c paperwork but the patient was no longer in the room. Nursing assistant and the charge RN stated the patient said she would find her own ride home and was seen ambulating out of the ED with independent steady gait. Nursing assistant stated her IV was removed.

## 2020-12-26 NOTE — Consult Note (Signed)
Neurology Consultation Reason for Consult: Neurological symptoms Referring Physician: Adela Lank, D  CC: Left-sided weakness  History is obtained from: Patient  HPI: Megan [name] is a 40 y.o. female who reports that around 5 PM she began having difficulties.  She complains of left-sided weakness, and was brought into the emergency department as a code stroke.  She does admit to doing cocaine yesterday, but denies anything other than a shot of liquor today.  On arrival, she was complaining of chest pain, she also had a period of unresponsiveness en route.  She had a markedly inconsistent exam with fluctuating symptoms that were not consistent with organic process.  Therefore code stroke was canceled.   LKW: 5 PM tpa given?: no, not a stroke   ROS: A 14 point ROS was performed and is negative except as noted in the HPI.  Past Medical History:  Diagnosis Date   Cirrhosis (HCC)    Diverticulosis    Dyslipidemia    GERD (gastroesophageal reflux disease)    H/O ileostomy    Hepatitis C    s/p treatment   Hypercholesteremia    Obesity    PCOS (polycystic ovarian syndrome)    PTSD (post-traumatic stress disorder)    Steatosis (HCC)      Family History  Problem Relation Age of Onset   High blood pressure Mother    Heart attack Father    High blood pressure Father    Heart attack Paternal Grandfather        47s   Colon cancer Maternal Grandfather    Breast cancer Maternal Aunt      Social History:  reports that she quit smoking about 5 years ago. Her smoking use included cigarettes. She has a 7.50 pack-year smoking history. She has never used smokeless tobacco. She reports that she does not currently use alcohol. She reports that she does not use drugs.   Exam: Current vital signs: BP 107/66   Pulse (!) 102   Temp 98.1 F (36.7 C) (Oral)   Resp 19   SpO2 90%  Vital signs in last 24 hours: Temp:  [98.1 F (36.7 C)] 98.1 F (36.7 C) (11/05 2343) Pulse Rate:  [101-102] 102  (11/05 2345) Resp:  [18-19] 19 (11/05 2345) BP: (107-112)/(66-78) 107/66 (11/05 2345) SpO2:  [89 %-90 %] 90 % (11/05 2345)   Physical Exam  Constitutional: Appears well-developed and well-nourished.  Psych: Affect appropriate to situation Eyes: No scleral injection HENT: No OP obstruction MSK: no joint deformities.  Cardiovascular: Normal rate and regular rhythm.  Respiratory: Effort normal, non-labored breathing GI: Soft.  No distension. There is no tenderness.  Skin: WDI  Neuro: Mental Status: Patient is awake, alert, oriented to person, place, month, year, and situation. Patient is able to give a clear and coherent history. No signs of aphasia or neglect Cranial Nerves: II: When I initially examined her she had a right hemianopia, however after discussing with other staff and within her hearing how she should have a left hemianopia, I reexamined her and she had a left hemianopia.. Pupils are equal, round, and reactive to light.   III,IV, VI: EOMI without ptosis or diploplia.  V: Facial sensation is symmetric to temperature VII: Facial movement is symmetric.  VIII: hearing is intact to voice X: Uvula elevates symmetrically XI: Shoulder shrug is symmetric. XII: tongue is midline without atrophy or fasciculations.  Motor: The patient gives a markedly variable exam, she at times has a left hemiparesis, but for instance she was gesticulating  while speaking using her hand to talk and holding it against gravity without difficulty or any noticeable deficit.  I then took the arm and held it up and told her that I was testing her strength and it fell flaccidly to the bed. Sensory: Sensation is diminished on the left Deep Tendon Reflexes: 2+ and symmetric in the biceps and patellae.  Cerebellar: No clear ataxia on the right  She has an episode of unresponsiveness, however while she was being unresponsive someone made a comment about drinking and the patient instantly responded "I am  not drunk."    I have reviewed labs in epic and the results pertinent to this consultation are: Creatinine 0.8  I have reviewed the images obtained: CT head/CTA-negative  Impression: 40 year old female with physical exam with markedly variable findings not consistent with organic disease.  At this time, I do not feel that any further neurologic work-up is needed.  Recommendations: 1) neurology will be available on an as-needed basis.   Ritta Slot, MD [facility] [phone]  If 7pm- 7am, please page neurology on call as listed in AMION.

## 2020-12-26 NOTE — Discharge Instructions (Signed)
Take 4 over the counter ibuprofen tablets 3 times a day or 2 over-the-counter naproxen tablets twice a day for pain. Also take tylenol 1000mg(2 extra strength) four times a day.    

## 2020-12-26 NOTE — ED Notes (Signed)
Pt left behind gold colored necklace. Items placed into specimen container, labeled and given to security.

## 2020-12-26 NOTE — ED Notes (Signed)
Pt ambulatory from room asking for exit. Registration attempted to direct pt to the lobby. Pt refused and walked out the EMS bay. Steady gait noted

## 2021-01-07 ENCOUNTER — Other Ambulatory Visit: Payer: Self-pay | Admitting: Neurological Surgery

## 2021-01-07 DIAGNOSIS — M5416 Radiculopathy, lumbar region: Secondary | ICD-10-CM

## 2021-02-17 ENCOUNTER — Telehealth (INDEPENDENT_AMBULATORY_CARE_PROVIDER_SITE_OTHER): Payer: Self-pay | Admitting: *Deleted

## 2021-02-17 NOTE — Telephone Encounter (Signed)
Called patient in response to message she left on voicemail about a procedure.  Called patient to try and schedule her for a laser ablation and was unable to reach, voicemail was full so was unable to leave a message. Will try again later.   Laser Ablation approved until 05/18/21 Auth ID [id number]

## 2021-02-19 ENCOUNTER — Ambulatory Visit
Admission: RE | Admit: 2021-02-19 | Discharge: 2021-02-19 | Disposition: A | Discharge status: Home / Self Care | Payer: Medicaid Other | Source: Ambulatory Visit | Attending: Neurological Surgery | Admitting: Neurological Surgery

## 2021-02-19 ENCOUNTER — Other Ambulatory Visit: Payer: Self-pay

## 2021-02-19 DIAGNOSIS — M5416 Radiculopathy, lumbar region: Secondary | ICD-10-CM

## 2021-02-19 IMAGING — MR MR LUMBAR SPINE W/O CM
4 of 5 series · 26 of 48 positions shown · non-contrast
Comparison: Previous MRI from [DATE].

CLINICAL DATA: Initial evaluation for low back pain for 1 year,
history of prior surgery.

EXAM:
MRI LUMBAR SPINE WITHOUT CONTRAST
TECHNIQUE: Multiplanar, multisequence MR imaging of the lumbar spine was
performed. No intravenous contrast was administered.

[Series 3: T2 · sagittal · 4.0mm · 0.53mm/px · 6 of 13 slices shown (1 of 2)]
[im 1/13]
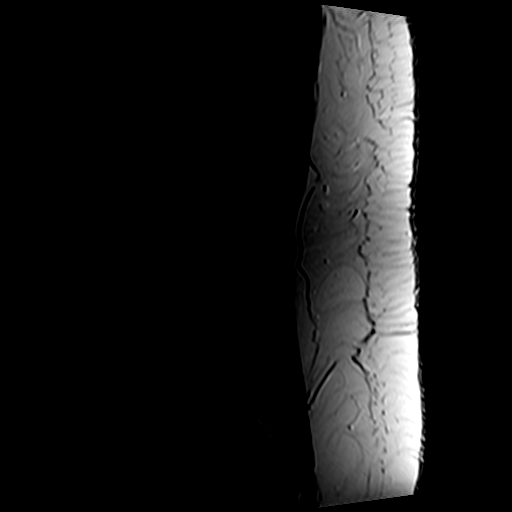
[im 3/13]
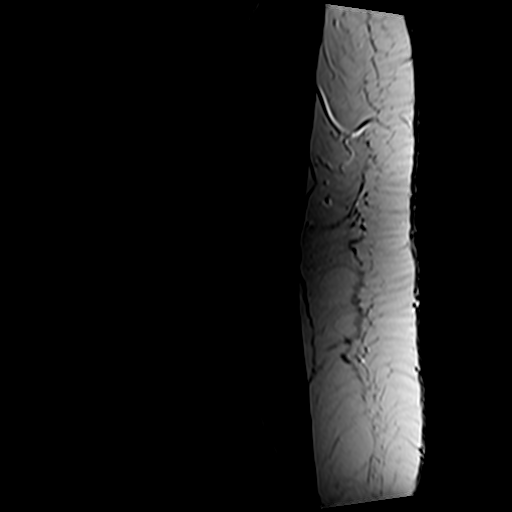
[im 5/13]
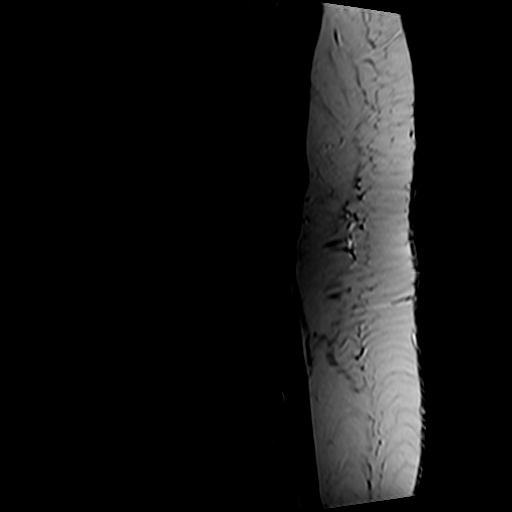
[im 8/13]
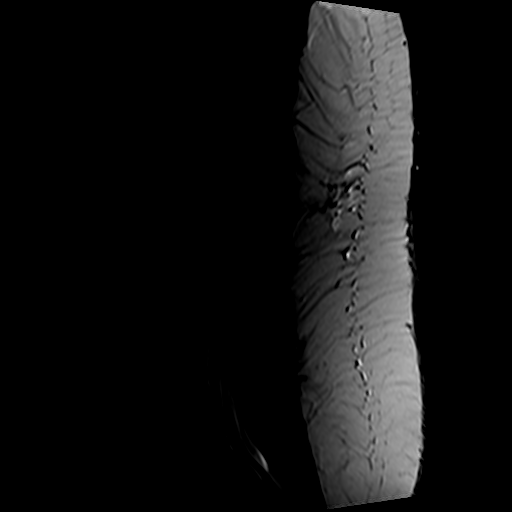
[im 10/13]
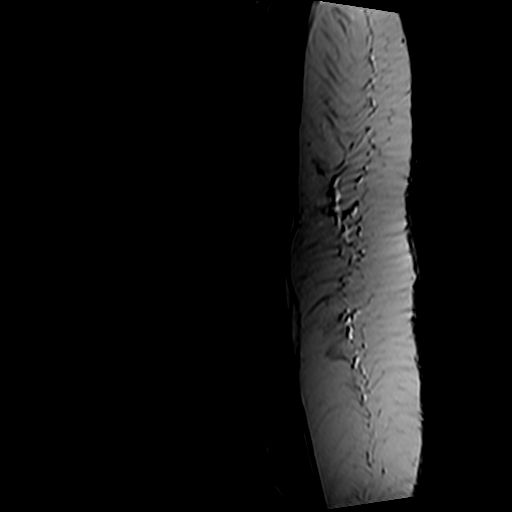
[im 13/13]
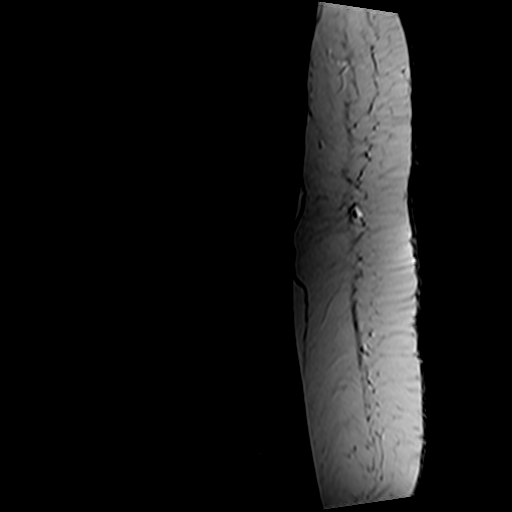

[Series 5: T1 · sagittal · 4.0mm · 0.53mm/px · 5 of 13 slices shown (1 of 2)]
[im 1/13]
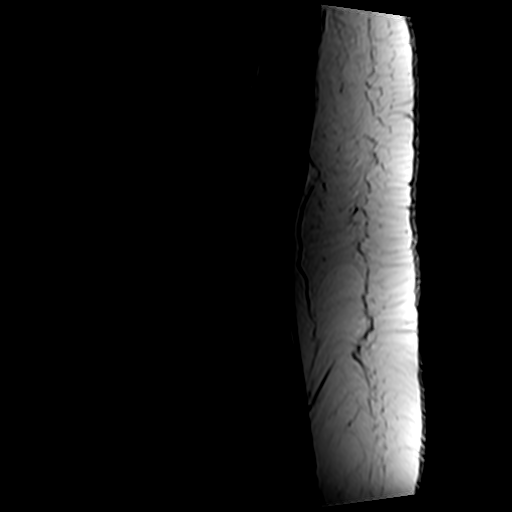
[im 4/13]
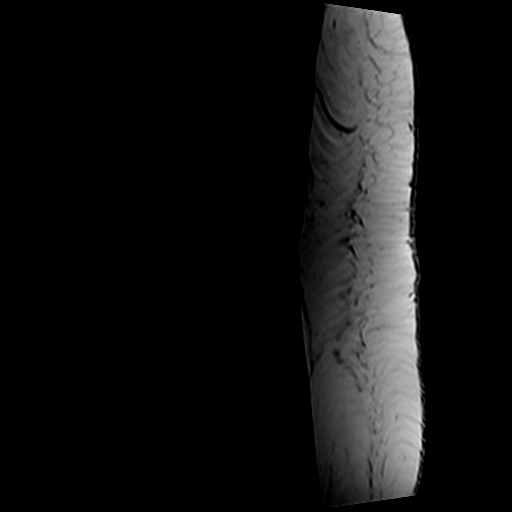
[im 7/13]
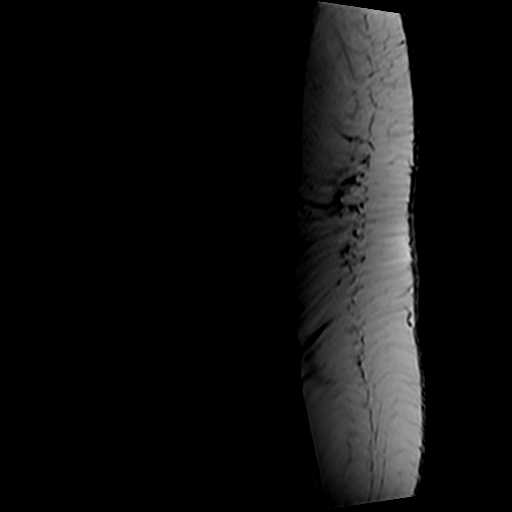
[im 10/13]
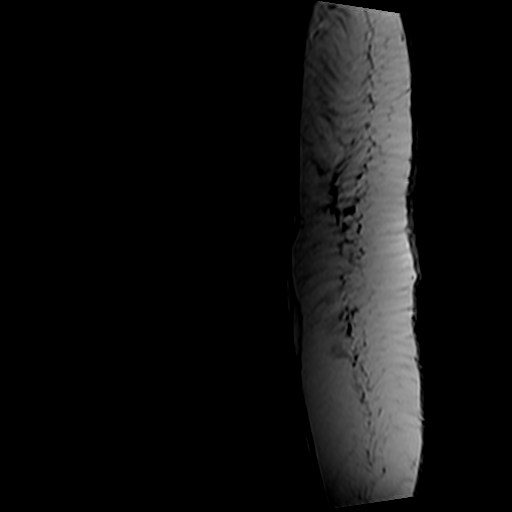
[im 13/13]
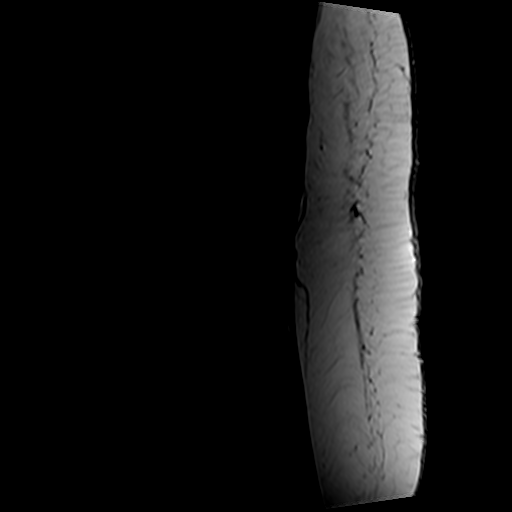

[Series 6: T2 · axial · 4.0mm · 0.70mm/px · z∈[-74,+142]mm · 10 of 38 slices shown (2 of 2)]
[im 3/38]
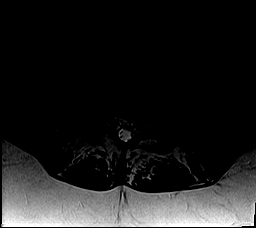
[im 5/38]
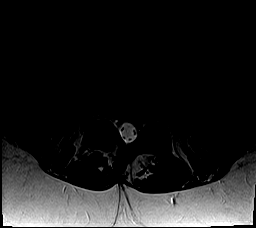
[im 8/38]
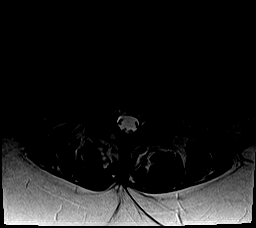
[im 13/38]
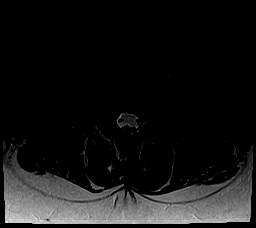
[im 18/38]
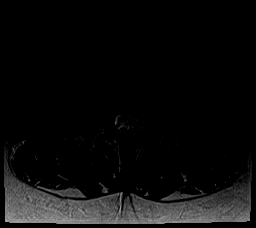
[im 20/38]
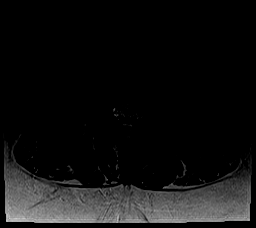
[im 23/38]
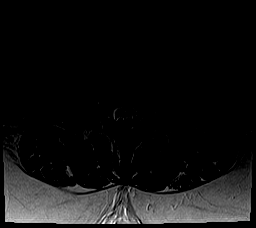
[im 28/38]
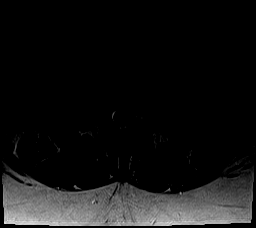
[im 33/38]
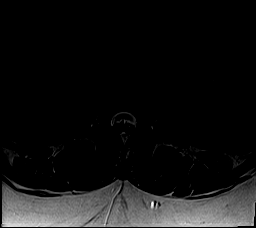
[im 38/38]
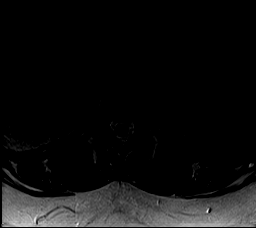

[Series 7: T1 · axial · 4.0mm · 0.35mm/px · z∈[-74,+116]mm · 5 of 38 slices shown (2 of 2)]
[im 3/38]
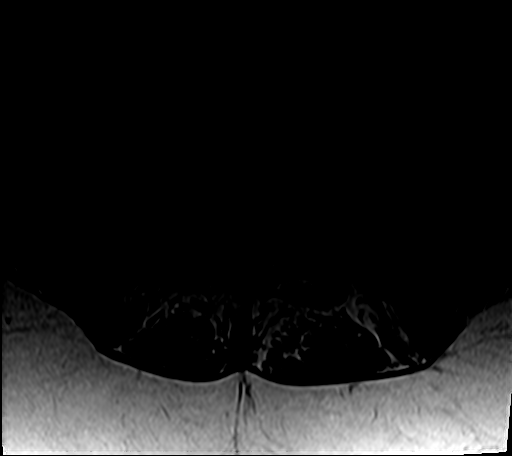
[im 5/38]
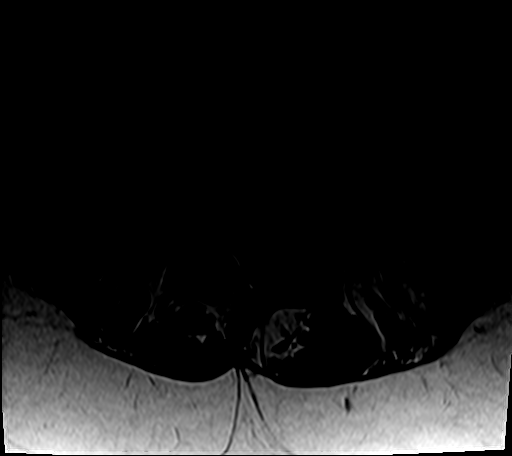
[im 8/38]
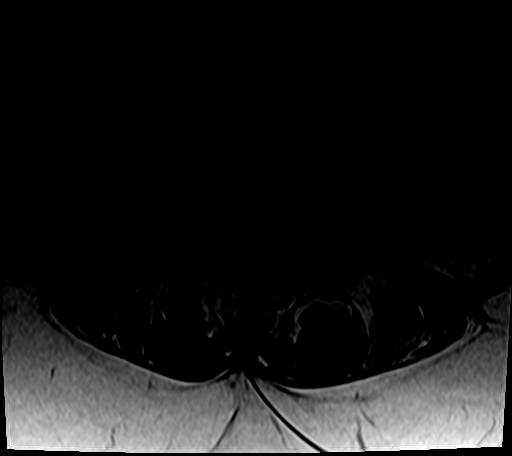
[im 20/38]
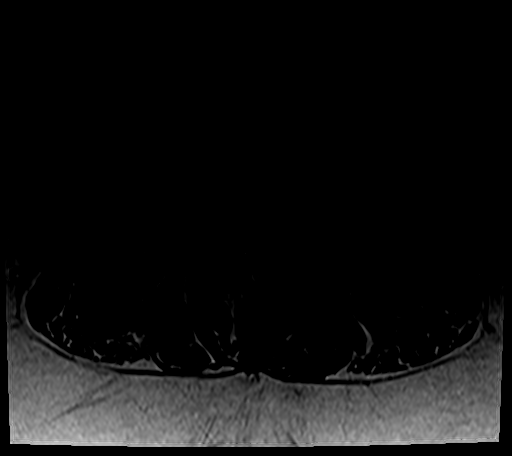
[im 33/38]
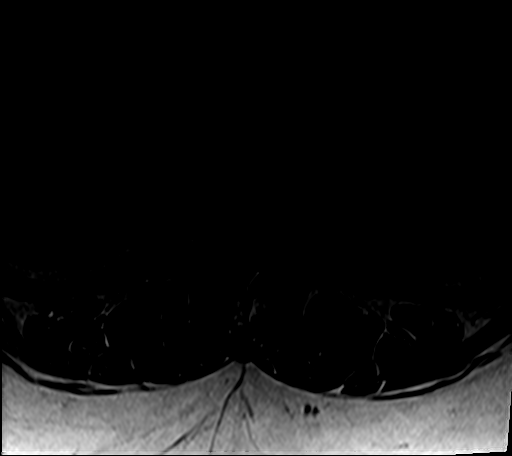

[26 of 48 positions shown; findings below may reference images not displayed]

FINDINGS: Segmentation: Standard. Lowest well-formed disc space labeled the
L5-S1 level.

Alignment: Physiologic with preservation of the normal lumbar
lordosis. No listhesis.

Vertebrae: Vertebral body height maintained without acute or chronic
fracture. Bone marrow signal intensity within normal limits. No
discrete or worrisome osseous lesions. Discogenic reactive endplate
change present about the L5-S1 interspace. No other abnormal marrow
edema.

Conus medullaris and cauda equina: Conus extends to the L1 level.
Conus and cauda equina appear normal.

Paraspinal and other soft tissues: Unremarkable.

Disc levels:

L1-2:  Unremarkable.

L2-3:  Unremarkable.

L3-4:  Unremarkable.

L4-5: Disc desiccation with minimal annular disc bulge. Mild facet
hypertrophy. No spinal stenosis. Foramina remain patent.

L5-S1: Degenerative intervertebral disc space narrowing with diffuse
disc bulge and disc desiccation. Associated discogenic reactive
endplate change with marginal endplate spurring. Probable changes of
prior left hemi laminectomy with micro discectomy. Probable small
residual and/or recurrent left subarticular disc protrusion closely
approximates the descending left S1 nerve root as it courses through
the left lateral recess (series 7, image 35). Residual mild left
lateral recess stenosis. Central canal remains patent. No foraminal
narrowing.
IMPRESSION: 1. Changes of prior left hemi laminectomy with micro discectomy at
L5-S1. Probable small residual and/or recurrent left subarticular
disc protrusion, closely approximating the descending left S1 nerve
root.
2. Minimal degenerative disc bulging at L4-5 without stenosis or
impingement.

## 2021-02-23 ENCOUNTER — Telehealth (INDEPENDENT_AMBULATORY_CARE_PROVIDER_SITE_OTHER): Payer: Self-pay

## 2021-02-23 NOTE — Telephone Encounter (Signed)
Patient called in and lvm saying her insurance is about to run out and that she was supposed to have a procedure done and wanted to get it scheduled. Please advise.

## 2021-02-24 ENCOUNTER — Other Ambulatory Visit (INDEPENDENT_AMBULATORY_CARE_PROVIDER_SITE_OTHER): Payer: Self-pay | Admitting: Nurse Practitioner

## 2021-02-24 MED ORDER — ALPRAZOLAM 0.5 MG PO TABS: ORAL_TABLET | ORAL | 0 refills | Status: DC

## 2021-04-01 ENCOUNTER — Ambulatory Visit (INDEPENDENT_AMBULATORY_CARE_PROVIDER_SITE_OTHER): Payer: Medicaid Other | Admitting: Vascular Surgery

## 2021-04-01 ENCOUNTER — Encounter (INDEPENDENT_AMBULATORY_CARE_PROVIDER_SITE_OTHER): Payer: Self-pay | Admitting: Vascular Surgery

## 2021-04-01 ENCOUNTER — Other Ambulatory Visit: Payer: Self-pay

## 2021-04-01 VITALS — BP 118/81 | HR 73 | Resp 16 | Wt 266.8 lb

## 2021-04-01 DIAGNOSIS — I83813 Varicose veins of bilateral lower extremities with pain: Secondary | ICD-10-CM | POA: Diagnosis not present

## 2021-04-01 NOTE — Progress Notes (Signed)
Megan [name] is a 41 y.o. female who presents with symptomatic venous reflux  Past Medical History:  Diagnosis Date   Cirrhosis (HCC)    Diverticulosis    Dyslipidemia    GERD (gastroesophageal reflux disease)    H/O ileostomy    Hepatitis C    s/p treatment   Hypercholesteremia    Obesity    PCOS (polycystic ovarian syndrome)    PTSD (post-traumatic stress disorder)    Steatosis (HCC)     Past Surgical History:  Procedure Laterality Date   ANTERIOR CERVICAL DECOMP/DISCECTOMY FUSION N/A 08/10/2020   Procedure: Cervical Six-Seven Anterior cervical decompression/discectomy/fusion;  Surgeon: Jadene Pierini, MD;  Location: [facility];  Service: Neurosurgery;  Laterality: N/A;  Cervical Six-Seven Anterior cervical decompression/discectomy/fusion   BACK SURGERY     COLON SURGERY  09/2018   @ [facility]; and again 12/2018   FRACTURE SURGERY     left hand   HAND SURGERY Left    HERNIA REPAIR  09/2019   Incisional hernia repair   KNEE ARTHROSCOPY Right    LIVER BIOPSY     LUMBAR LAMINECTOMY/DECOMPRESSION MICRODISCECTOMY N/A 07/15/2018   Procedure: L5-S1 MINIMALLY INVASIVE LUMBAR DISCECTOMY;  Surgeon: Jadene Pierini, MD;  Location: [facility];  Service: Neurosurgery;  Laterality: N/A;   SHOULDER ARTHROSCOPY WITH LABRAL REPAIR Left 04/20/2020   Procedure: LEFT SHOULDER ARTHROSCOPY WITH LABRAL REPAIR, SUBACROMIAL DEBRIDEMENT;  Surgeon: Juanell Fairly, MD;  Location: [facility];  Service: Orthopedics;  Laterality: Left;   TONSILLECTOMY     UPPER EXTREMITY VENOGRAPHY Left 08/26/2020   Procedure: UPPER EXTREMITY VENOGRAPHY;  Surgeon: Annice Needy, MD;  Location: [facility];  Service: Cardiovascular;  Laterality: Left;     Current Outpatient Medications:    ALPRAZolam (XANAX) 0.5 MG tablet, Take one tab one hour before procedure and one tab when you arrive in office, Disp: 2 tablet, Rfl: 0   atorvastatin (LIPITOR) 40 MG tablet, Take 40 mg by mouth daily., Disp: , Rfl:    celecoxib  (CELEBREX) 200 MG capsule, Take 200 mg by mouth 2 (two) times daily., Disp: , Rfl:    cyclobenzaprine (FLEXERIL) 5 MG tablet, Take 1 tablet (5 mg total) by mouth 3 (three) times daily as needed (muscle pain/aches)., Disp: 20 tablet, Rfl: 0   diazepam (VALIUM) 5 MG tablet, Take 5 mg by mouth daily as needed for anxiety., Disp: , Rfl:    meloxicam (MOBIC) 7.5 MG tablet, meloxicam 7.5 mg tablet  Take 1 tablet twice a day by oral route with meals., Disp: , Rfl:    Netarsudil-Latanoprost (ROCKLATAN) 0.02-0.005 % SOLN, Place 1 drop into both eyes at bedtime., Disp: , Rfl:    omeprazole (PRILOSEC) 20 MG capsule, Take 20 mg by mouth daily. , Disp: , Rfl:    ondansetron (ZOFRAN) 4 MG tablet, Take 1 tablet (4 mg total) by mouth every 8 (eight) hours as needed for nausea or vomiting., Disp: 30 tablet, Rfl: 0   ondansetron (ZOFRAN) 4 MG tablet, Take 1 tablet (4 mg total) by mouth every 6 (six) hours as needed for nausea or vomiting., Disp: 20 tablet, Rfl: 0   timolol (TIMOPTIC) 0.5 % ophthalmic solution, Place 1 drop into both eyes 2 (two) times daily., Disp: , Rfl:    oxyCODONE 10 MG TABS, Take 1 tablet (10 mg total) by mouth every 4 (four) hours as needed for severe pain ((score 7 to 10))., Disp: 30 tablet, Rfl: 0  Allergies  Allergen Reactions   Levofloxacin Itching  Other reaction(s): Itching   Lisinopril Anaphylaxis and Swelling    Other reaction(s): Anaphylaxis, Angioedema (ALLERGY/intolerance), Swelling Other reaction(s): Angioedema    Varenicline Nausea And Vomiting   Clindamycin Nausea And Vomiting    Other reaction(s): Nausea And Vomiting   Penicillins Other (See Comments)    Childhood reaction     Varicose veins of leg with pain, bilateral     PLAN: The patient's right lower extremity was sterilely prepped and draped. The ultrasound machine was used to visualize the saphenous vein throughout its course. A segment in the upper calf was selected for access. The saphenous vein was  accessed without difficulty using ultrasound guidance with a micropuncture needle. A 0.018 wire was then placed beyond the saphenofemoral junction and the needle was removed. The 65 cm sheath was then placed over the wire and the wire and dilator were removed. The laser fiber was then placed through the sheath and its tip was placed approximately 4-5 centimeters below the saphenofemoral junction. Tumescent anesthesia was then created with a dilute lidocaine solution. Laser energy was then delivered with constant withdrawal of the sheath and laser fiber. Approximately 1297 joules of energy were delivered over a length of 33 centimeters using a 1470 Hz VenaCure machine at 7 W. Sterile dressings were placed. The patient tolerated the procedure well without obvious complications.   Follow-up in 1 week with post-laser duplex.

## 2021-04-04 ENCOUNTER — Other Ambulatory Visit (INDEPENDENT_AMBULATORY_CARE_PROVIDER_SITE_OTHER): Payer: Self-pay | Admitting: Vascular Surgery

## 2021-04-04 DIAGNOSIS — Z9889 Other specified postprocedural states: Secondary | ICD-10-CM

## 2021-04-05 ENCOUNTER — Ambulatory Visit (INDEPENDENT_AMBULATORY_CARE_PROVIDER_SITE_OTHER): Payer: Medicaid Other

## 2021-04-05 ENCOUNTER — Encounter (INDEPENDENT_AMBULATORY_CARE_PROVIDER_SITE_OTHER): Payer: Medicaid Other

## 2021-04-05 ENCOUNTER — Other Ambulatory Visit: Payer: Self-pay

## 2021-04-05 ENCOUNTER — Telehealth (INDEPENDENT_AMBULATORY_CARE_PROVIDER_SITE_OTHER): Payer: Self-pay

## 2021-04-05 DIAGNOSIS — Z9889 Other specified postprocedural states: Secondary | ICD-10-CM | POA: Diagnosis not present

## 2021-04-05 NOTE — Telephone Encounter (Signed)
Xanax 0.5mg  tablet #2 prescription was left on pharmacy voicemail for [facility] in [place]. Patient was made aware

## 2021-04-15 ENCOUNTER — Encounter (INDEPENDENT_AMBULATORY_CARE_PROVIDER_SITE_OTHER): Payer: Self-pay | Admitting: Vascular Surgery

## 2021-04-15 ENCOUNTER — Other Ambulatory Visit: Payer: Self-pay

## 2021-04-15 ENCOUNTER — Ambulatory Visit (INDEPENDENT_AMBULATORY_CARE_PROVIDER_SITE_OTHER): Payer: Medicaid Other | Admitting: Vascular Surgery

## 2021-04-15 VITALS — BP 153/85 | HR 60 | Resp 16 | Wt 271.8 lb

## 2021-04-15 DIAGNOSIS — I83813 Varicose veins of bilateral lower extremities with pain: Secondary | ICD-10-CM | POA: Diagnosis not present

## 2021-04-15 NOTE — Progress Notes (Signed)
Megan [name] is a 41 y.o. female who presents with symptomatic venous reflux  Past Medical History:  Diagnosis Date   Cirrhosis (HCC)    Diverticulosis    Dyslipidemia    GERD (gastroesophageal reflux disease)    H/O ileostomy    Hepatitis C    s/p treatment   Hypercholesteremia    Obesity    PCOS (polycystic ovarian syndrome)    PTSD (post-traumatic stress disorder)    Steatosis (HCC)     Past Surgical History:  Procedure Laterality Date   ANTERIOR CERVICAL DECOMP/DISCECTOMY FUSION N/A 08/10/2020   Procedure: Cervical Six-Seven Anterior cervical decompression/discectomy/fusion;  Surgeon: Jadene Pierini, MD;  Location: [facility];  Service: Neurosurgery;  Laterality: N/A;  Cervical Six-Seven Anterior cervical decompression/discectomy/fusion   BACK SURGERY     COLON SURGERY  09/2018   @ [facility]; and again 12/2018   FRACTURE SURGERY     left hand   HAND SURGERY Left    HERNIA REPAIR  09/2019   Incisional hernia repair   KNEE ARTHROSCOPY Right    LIVER BIOPSY     LUMBAR LAMINECTOMY/DECOMPRESSION MICRODISCECTOMY N/A 07/15/2018   Procedure: L5-S1 MINIMALLY INVASIVE LUMBAR DISCECTOMY;  Surgeon: Jadene Pierini, MD;  Location: [facility];  Service: Neurosurgery;  Laterality: N/A;   SHOULDER ARTHROSCOPY WITH LABRAL REPAIR Left 04/20/2020   Procedure: LEFT SHOULDER ARTHROSCOPY WITH LABRAL REPAIR, SUBACROMIAL DEBRIDEMENT;  Surgeon: Juanell Fairly, MD;  Location: [facility];  Service: Orthopedics;  Laterality: Left;   TONSILLECTOMY     UPPER EXTREMITY VENOGRAPHY Left 08/26/2020   Procedure: UPPER EXTREMITY VENOGRAPHY;  Surgeon: Annice Needy, MD;  Location: [facility];  Service: Cardiovascular;  Laterality: Left;     Current Outpatient Medications:    ALPRAZolam (XANAX) 0.5 MG tablet, Take one tab one hour before procedure and one tab when you arrive in office, Disp: 2 tablet, Rfl: 0   atorvastatin (LIPITOR) 40 MG tablet, Take 40 mg by mouth daily., Disp: , Rfl:    celecoxib  (CELEBREX) 200 MG capsule, Take 200 mg by mouth 2 (two) times daily., Disp: , Rfl:    cyclobenzaprine (FLEXERIL) 5 MG tablet, Take 1 tablet (5 mg total) by mouth 3 (three) times daily as needed (muscle pain/aches)., Disp: 20 tablet, Rfl: 0   diazepam (VALIUM) 5 MG tablet, Take 5 mg by mouth daily as needed for anxiety., Disp: , Rfl:    meloxicam (MOBIC) 7.5 MG tablet, meloxicam 7.5 mg tablet  Take 1 tablet twice a day by oral route with meals., Disp: , Rfl:    Netarsudil-Latanoprost (ROCKLATAN) 0.02-0.005 % SOLN, Place 1 drop into both eyes at bedtime., Disp: , Rfl:    omeprazole (PRILOSEC) 20 MG capsule, Take 20 mg by mouth daily. , Disp: , Rfl:    ondansetron (ZOFRAN) 4 MG tablet, Take 1 tablet (4 mg total) by mouth every 8 (eight) hours as needed for nausea or vomiting., Disp: 30 tablet, Rfl: 0   ondansetron (ZOFRAN) 4 MG tablet, Take 1 tablet (4 mg total) by mouth every 6 (six) hours as needed for nausea or vomiting., Disp: 20 tablet, Rfl: 0   timolol (TIMOPTIC) 0.5 % ophthalmic solution, Place 1 drop into both eyes 2 (two) times daily., Disp: , Rfl:    oxyCODONE 10 MG TABS, Take 1 tablet (10 mg total) by mouth every 4 (four) hours as needed for severe pain ((score 7 to 10))., Disp: 30 tablet, Rfl: 0  Allergies  Allergen Reactions   Levofloxacin Itching  Other reaction(s): Itching   Lisinopril Anaphylaxis and Swelling    Other reaction(s): Anaphylaxis, Angioedema (ALLERGY/intolerance), Swelling Other reaction(s): Angioedema    Varenicline Nausea And Vomiting   Clindamycin Nausea And Vomiting    Other reaction(s): Nausea And Vomiting   Penicillins Other (See Comments)    Childhood reaction     Varicose veins of leg with pain, bilateral     PLAN: The patient's left lower extremity was sterilely prepped and draped. The ultrasound machine was used to visualize the saphenous vein throughout its course. A segment in the upper calf was selected for access. The saphenous vein was  accessed without difficulty using ultrasound guidance with a micropuncture needle. A 0.018 wire was then placed beyond the saphenofemoral junction and the needle was removed. The 65 cm sheath was then placed over the wire and the wire and dilator were removed. The laser fiber was then placed through the sheath and its tip was placed approximately 4-5 centimeters below the saphenofemoral junction. Tumescent anesthesia was then created with a dilute lidocaine solution. Laser energy was then delivered with constant withdrawal of the sheath and laser fiber. Approximately 1528 joules of energy were delivered over a length of 36 centimeters using a 1470 Hz VenaCure machine at 7 W. Sterile dressings were placed. The patient tolerated the procedure well without obvious complications.   Follow-up in 1 week with post-laser duplex.

## 2021-04-22 ENCOUNTER — Other Ambulatory Visit (INDEPENDENT_AMBULATORY_CARE_PROVIDER_SITE_OTHER): Payer: Self-pay | Admitting: Vascular Surgery

## 2021-04-22 ENCOUNTER — Other Ambulatory Visit: Payer: Self-pay

## 2021-04-22 ENCOUNTER — Ambulatory Visit (INDEPENDENT_AMBULATORY_CARE_PROVIDER_SITE_OTHER): Payer: Medicaid Other

## 2021-04-22 DIAGNOSIS — Z9889 Other specified postprocedural states: Secondary | ICD-10-CM

## 2021-04-22 DIAGNOSIS — I83813 Varicose veins of bilateral lower extremities with pain: Secondary | ICD-10-CM

## 2021-04-29 ENCOUNTER — Other Ambulatory Visit: Payer: Self-pay | Admitting: Physician Assistant

## 2021-04-29 ENCOUNTER — Other Ambulatory Visit: Payer: Self-pay

## 2021-04-29 ENCOUNTER — Ambulatory Visit (INDEPENDENT_AMBULATORY_CARE_PROVIDER_SITE_OTHER): Payer: Medicaid Other | Admitting: Vascular Surgery

## 2021-04-29 ENCOUNTER — Encounter (INDEPENDENT_AMBULATORY_CARE_PROVIDER_SITE_OTHER): Payer: Self-pay | Admitting: Vascular Surgery

## 2021-04-29 VITALS — BP 113/75 | HR 80 | Resp 16 | Ht 69.0 in | Wt 271.0 lb

## 2021-04-29 DIAGNOSIS — M7989 Other specified soft tissue disorders: Secondary | ICD-10-CM | POA: Diagnosis not present

## 2021-04-29 DIAGNOSIS — I890 Lymphedema, not elsewhere classified: Secondary | ICD-10-CM | POA: Diagnosis not present

## 2021-04-29 DIAGNOSIS — Z1231 Encounter for screening mammogram for malignant neoplasm of breast: Secondary | ICD-10-CM

## 2021-04-29 DIAGNOSIS — G7109 Other specified muscular dystrophies: Secondary | ICD-10-CM | POA: Insufficient documentation

## 2021-04-29 DIAGNOSIS — I83813 Varicose veins of bilateral lower extremities with pain: Secondary | ICD-10-CM | POA: Diagnosis not present

## 2021-04-29 DIAGNOSIS — I10 Essential (primary) hypertension: Secondary | ICD-10-CM

## 2021-04-29 HISTORY — DX: Other specified muscular dystrophies: G71.09

## 2021-04-29 NOTE — Assessment & Plan Note (Signed)
The patient had successful laser ablations of both great saphenous veins without DVT.  Her swelling is better and her pain is better but not resolved.  Overall she is doing well.  Does not have a lot of residual varicosities that require further treatment.  Return to clinic in 6 months. ?

## 2021-04-29 NOTE — Progress Notes (Signed)
MRN : [id number]  Megan [name] is a 41 y.o. (1980-10-19) female who presents with chief complaint of  Chief Complaint  Patient presents with   Follow-up    30 day f/u  .  History of Present Illness: Patient returns today in follow up of her venous insufficiency.  She is status post bilateral great saphenous vein laser ablation in a staged fashion that went well.  Her swelling in the legs is resolved.  Her pain is better but not resolved.  She has persistent left arm swelling that is basically unchanged.  Both postprocedural duplex shows successful ablation without DVT.  Current Outpatient Medications  Medication Sig Dispense Refill   atorvastatin (LIPITOR) 40 MG tablet Take 40 mg by mouth daily.     celecoxib (CELEBREX) 200 MG capsule Take 200 mg by mouth 2 (two) times daily.     diazepam (VALIUM) 5 MG tablet Take 5 mg by mouth daily as needed for anxiety.     meloxicam (MOBIC) 7.5 MG tablet meloxicam 7.5 mg tablet  Take 1 tablet twice a day by oral route with meals.     Netarsudil-Latanoprost (ROCKLATAN) 0.02-0.005 % SOLN Place 1 drop into both eyes at bedtime.     omeprazole (PRILOSEC) 20 MG capsule Take 20 mg by mouth daily.      ALPRAZolam (XANAX) 0.5 MG tablet Take one tab one hour before procedure and one tab when you arrive in office (Patient not taking: Reported on 04/29/2021) 2 tablet 0   cyclobenzaprine (FLEXERIL) 5 MG tablet Take 1 tablet (5 mg total) by mouth 3 (three) times daily as needed (muscle pain/aches). (Patient not taking: Reported on 04/29/2021) 20 tablet 0   ondansetron (ZOFRAN) 4 MG tablet Take 1 tablet (4 mg total) by mouth every 8 (eight) hours as needed for nausea or vomiting. (Patient not taking: Reported on 04/29/2021) 30 tablet 0   ondansetron (ZOFRAN) 4 MG tablet Take 1 tablet (4 mg total) by mouth every 6 (six) hours as needed for nausea or vomiting. (Patient not taking: Reported on 04/29/2021) 20 tablet 0   oxyCODONE 10 MG TABS Take 1 tablet (10 mg total) by  mouth every 4 (four) hours as needed for severe pain ((score 7 to 10)). 30 tablet 0   timolol (TIMOPTIC) 0.5 % ophthalmic solution Place 1 drop into both eyes 2 (two) times daily. (Patient not taking: Reported on 04/29/2021)     No current facility-administered medications for this visit.    Past Medical History:  Diagnosis Date   Cirrhosis (HCC)    Diverticulosis    Dyslipidemia    GERD (gastroesophageal reflux disease)    H/O ileostomy    Hepatitis C    s/p treatment   Hypercholesteremia    Obesity    PCOS (polycystic ovarian syndrome)    PTSD (post-traumatic stress disorder)    Steatosis (HCC)     Past Surgical History:  Procedure Laterality Date   ANTERIOR CERVICAL DECOMP/DISCECTOMY FUSION N/A 08/10/2020   Procedure: Cervical Six-Seven Anterior cervical decompression/discectomy/fusion;  Surgeon: Jadene Pierini, MD;  Location: [facility] OR;  Service: Neurosurgery;  Laterality: N/A;  Cervical Six-Seven Anterior cervical decompression/discectomy/fusion   BACK SURGERY     COLON SURGERY  09/2018   @ [facility]; and again 12/2018   FRACTURE SURGERY     left hand   HAND SURGERY Left    HERNIA REPAIR  09/2019   Incisional hernia repair   KNEE ARTHROSCOPY Right    LIVER BIOPSY  LUMBAR LAMINECTOMY/DECOMPRESSION MICRODISCECTOMY N/A 07/15/2018   Procedure: L5-S1 MINIMALLY INVASIVE LUMBAR DISCECTOMY;  Surgeon: Jadene Pierini, MD;  Location: [facility] OR;  Service: Neurosurgery;  Laterality: N/A;   SHOULDER ARTHROSCOPY WITH LABRAL REPAIR Left 04/20/2020   Procedure: LEFT SHOULDER ARTHROSCOPY WITH LABRAL REPAIR, SUBACROMIAL DEBRIDEMENT;  Surgeon: Juanell Fairly, MD;  Location: [facility] ORS;  Service: Orthopedics;  Laterality: Left;   TONSILLECTOMY     UPPER EXTREMITY VENOGRAPHY Left 08/26/2020   Procedure: UPPER EXTREMITY VENOGRAPHY;  Surgeon: Annice Needy, MD;  Location: [facility] IN[facility]IVE [facility];  Service: Cardiovascular;  Laterality: Left;     Social History   Tobacco Use   Smoking status:  Former    Packs/day: 0.50    Years: 15.00    Pack years: 7.50    Types: Cigarettes    Quit date: 03/30/2015    Years since quitting: 6.0   Smokeless tobacco: Never  Vaping Use   Vaping Use: Never used  Substance Use Topics   Alcohol use: Not Currently    Comment: social   Drug use: No      Family History  Problem Relation Age of Onset   High blood pressure Mother    Heart attack Father    High blood pressure Father    Heart attack Paternal Grandfather        36s   Colon cancer Maternal Grandfather    Breast cancer Maternal Aunt      Allergies  Allergen Reactions   Levofloxacin Itching    Other reaction(s): Itching   Lisinopril Anaphylaxis and Swelling    Other reaction(s): Anaphylaxis, Angioedema (ALLERGY/intolerance), Swelling Other reaction(s): Angioedema    Varenicline Nausea And Vomiting   Clindamycin Nausea And Vomiting    Other reaction(s): Nausea And Vomiting   Penicillins Other (See Comments)    Childhood reaction    REVIEW OF SYSTEMS (Negative unless checked)   Constitutional: Weight loss  Fever  Chills Cardiac: Chest pain   Chest pressure   Palpitations   Shortness of breath when laying flat   Shortness of breath at rest   Shortness of breath with exertion. Vascular:  Pain in legs with walking   Pain in legs at rest   Pain in legs when laying flat   Claudication   Pain in feet when walking  Pain in feet at rest  Pain in feet when laying flat   History of DVT   Phlebitis   Swelling in legs   Varicose veins   Non-healing ulcers Pulmonary:   Uses home oxygen   Productive cough   Hemoptysis   Wheeze  COPD   Asthma Neurologic:  Dizziness  Blackouts   Seizures   History of stroke   History of TIA  Aphasia   Temporary blindness   Dysphagia   Weakness or numbness in arms   Weakness or numbness in legs Musculoskeletal:  Arthritis   Joint swelling   Joint pain   Low back  pain Hematologic:  Easy bruising  Easy bleeding   Hypercoagulable state   Anemic   Gastrointestinal:  Blood in stool   Vomiting blood  Gastroesophageal reflux/heartburn   Abdominal pain Genitourinary:  Chronic kidney disease   Difficult urination  Frequent urination  Burning with urination   Hematuria Skin:  Rashes   Ulcers   Wounds Psychological:  History of anxiety    History of major depression.  Physical Examination  BP 113/75 (BP Location: Left Arm)    Pulse 80    Resp 16  Ht 5\' 9"  (1.753 m)    Wt 271 lb (122.9 kg)    BMI 40.02 kg/m  Gen:  WD/WN, NAD Head: [place]/AT, No temporalis wasting. Ear/Nose/Throat: Hearing grossly intact, nares w/o erythema or drainage Eyes: Conjunctiva clear. Sclera non-icteric Neck: Supple.  Trachea midline Pulmonary:  Good air movement, no use of accessory muscles.  Cardiac: RRR, no JVD Vascular:  Vessel Right Left  Radial Palpable Palpable                          PT Palpable Palpable  DP Palpable Palpable    Musculoskeletal: M/S 5/5 throughout.  No deformity or atrophy.  1+ left upper extremity edema.  No lower extremity edema. Neurologic: Sensation grossly intact in extremities.  Symmetrical.  Speech is fluent.  Psychiatric: Judgment intact, Mood & affect appropriate for pt's clinical situation. Dermatologic: No rashes or ulcers noted.  No cellulitis or open wounds.      Labs No results found for this or any previous visit (from the past 2160 hour(s)).  Radiology VAS 2161 LOWER EXT VENOUS POST ABLATION  Result Date: 04/27/2021  Lower Venous Reflux Study Patient Name:  DODI LEU  Date of Exam:   04/22/2021 Medical Rec #: [id number]/22/2021     Accession #:    [id number] Date of Birth: [birth year]     Patient Gender: F Patient Age:   44 years Exam Location:  [facility] Procedure:      VAS 45 LOWER EXTREMITY VENOUS POST ABLATION Referring Phys: [place] Deklynn Charlet  --------------------------------------------------------------------------------  Indications: Post Lt GSV Ablation.  Performing Technologist: Barbara Cower RVS  Examination Guidelines: A complete evaluation includes B-mode imaging, spectral Doppler, color Doppler, and power Doppler as needed of all accessible portions of each vessel. Bilateral testing is considered an integral part of a complete examination. Limited examinations for reoccurring indications may be performed as noted. The reflux portion of the exam is performed with the patient in reverse Trendelenburg. Significant venous reflux is defined as >500 ms in the superficial venous system, and >1 second in the deep venous system.  +--------------+--------+------+----------+------------+-----------------------+  LEFT           Reflux   Reflux   Reflux   Diameter cms Comments                                 No        Yes      Time                                          +--------------+--------+------+----------+------------+-----------------------+  CFV            no                                                               +--------------+--------+------+----------+------------+-----------------------+  FV prox        no                                                               +--------------+--------+------+----------+------------+-----------------------+  FV mid         no                                                               +--------------+--------+------+----------+------------+-----------------------+  FV dist        no                                                               +--------------+--------+------+----------+------------+-----------------------+  Popliteal      no                                                               +--------------+--------+------+----------+------------+-----------------------+  GSV at [facility]     no                                                                +--------------+--------+------+----------+------------+-----------------------+  GSV prox thigh                                         prior                                                                            ablation/stripping       +--------------+--------+------+----------+------------+-----------------------+  GSV mid thigh                                          prior                                                                            ablation/stripping       +--------------+--------+------+----------+------------+-----------------------+  GSV dist thigh                                         prior  ablation/stripping       +--------------+--------+------+----------+------------+-----------------------+  GSV at knee                                            prior                                                                            ablation/stripping       +--------------+--------+------+----------+------------+-----------------------+  GSV prox calf  no                                                               +--------------+--------+------+----------+------------+-----------------------+   Summary: Left: - No evidence of deep vein thrombosis seen in the left lower extremity, from the common femoral through the popliteal veins. - No evidence of superficial venous thrombosis in the left lower extremity. - There is no evidence of venous reflux seen in the left lower extremity. - The Left GSV appears to occluded via Ablation from knee level to proximal Thigh area.  *See table(s) above for measurements and observations. Electronically signed by Festus BarrenJason Drue Harr MD on 04/27/2021 at 3:03:14 PM.    Final    VAS US LOWER EXT VENOUS POST ABLATION  Result Date: 04/18/2021  Lower Venous Reflux Study Patient Name:  Parthenia AmesREBECCA Dorough  Date of Exam:   04/05/2021 Medical Rec #: [id number]     Accession #:    [id number] Date of Birth:  1981-01-12     Patient Gender: F Patient Age:   [age] years Exam Location:  [facility] Procedure:      VAS US LOWER EXTREMITY VENOUS POST ABLATION Referring Phys: Barbara CowerJASON Dallie Patton --------------------------------------------------------------------------------  Other Indications: Rt GSV ablation 1 week. Performing Technologist: Salvadore Farbererry Knight RVT  Examination Guidelines: A complete evaluation includes B-mode imaging, spectral Doppler, color Doppler, and power Doppler as needed of all accessible portions of each vessel. Bilateral testing is considered an integral part of a complete examination. Limited examinations for reoccurring indications may be performed as noted. The reflux portion of the exam is performed with the patient in reverse Trendelenburg. Significant venous reflux is defined as >500 ms in the superficial venous system, and >1 second in the deep venous system.   Summary: Right: - No evidence of deep vein thrombosis seen in the right lower extremity, from the common femoral through the popliteal veins. - Rt GSV closed s/p ablation  *See table(s) above for measurements and observations. Electronically signed by Festus BarrenJason Vinia Jemmott MD on 04/18/2021 at 11:30:04 AM.    Final     Assessment/Plan Left arm swelling Venogram was normal.  Appears to be lymphedema after her trauma, injury, and subsequent left shoulder surgery   Lymphedema The patient has lymphedema of the left arm.  This has persistent pain and swelling refractory to compression and elevation.  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  Essential (primary) hypertension blood pressure control important in reducing the  progression of atherosclerotic disease. On appropriate oral medications.  Varicose veins of leg with pain, bilateral The patient had successful laser ablations of both great saphenous veins without DVT.  Her swelling is better and her pain is better but not resolved.  Overall she is doing well.  Does not have a lot of residual  varicosities that require further treatment.  Return to clinic in 6 months.    Festus Barren, MD  04/29/2021 10:28 AM    This note was created with Dragon medical transcription system.  Any errors from dictation are purely unintentional

## 2021-05-03 ENCOUNTER — Ambulatory Visit (INDEPENDENT_AMBULATORY_CARE_PROVIDER_SITE_OTHER): Payer: Medicaid Other | Admitting: Vascular Surgery

## 2021-05-13 ENCOUNTER — Ambulatory Visit (INDEPENDENT_AMBULATORY_CARE_PROVIDER_SITE_OTHER): Payer: Medicaid Other | Admitting: Vascular Surgery

## 2021-06-02 ENCOUNTER — Ambulatory Visit
Admission: RE | Admit: 2021-06-02 | Discharge: 2021-06-02 | Disposition: A | Discharge status: Home / Self Care | Payer: Medicaid Other | Source: Ambulatory Visit | Attending: Physician Assistant | Admitting: Physician Assistant

## 2021-06-02 DIAGNOSIS — Z1231 Encounter for screening mammogram for malignant neoplasm of breast: Secondary | ICD-10-CM

## 2021-06-02 IMAGING — MG MM DIGITAL SCREENING BILAT W/ TOMO AND CAD
6 of 10 series · 6 of 30 positions shown · non-contrast
Comparison: Previous exam(s).

ACR Breast Density Category a: The breast tissue is almost entirely
fatty.

CLINICAL DATA: Screening.

EXAM:
DIGITAL SCREENING BILATERAL MAMMOGRAM WITH TOMOSYNTHESIS AND CAD
TECHNIQUE: Bilateral screening digital craniocaudal and mediolateral oblique
mammograms were obtained. Bilateral screening digital breast
tomosynthesis was performed. The images were evaluated with
computer-aided detection.

[R MLO synth-2D]
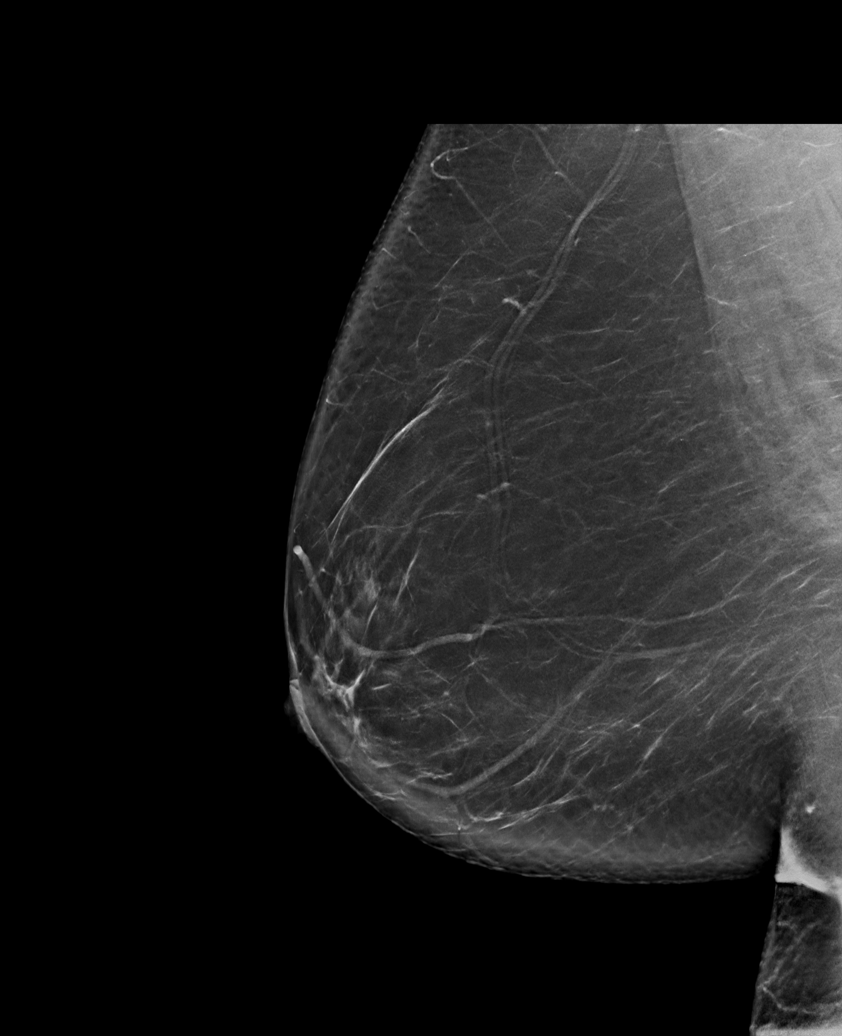

[R CC synth-2D]
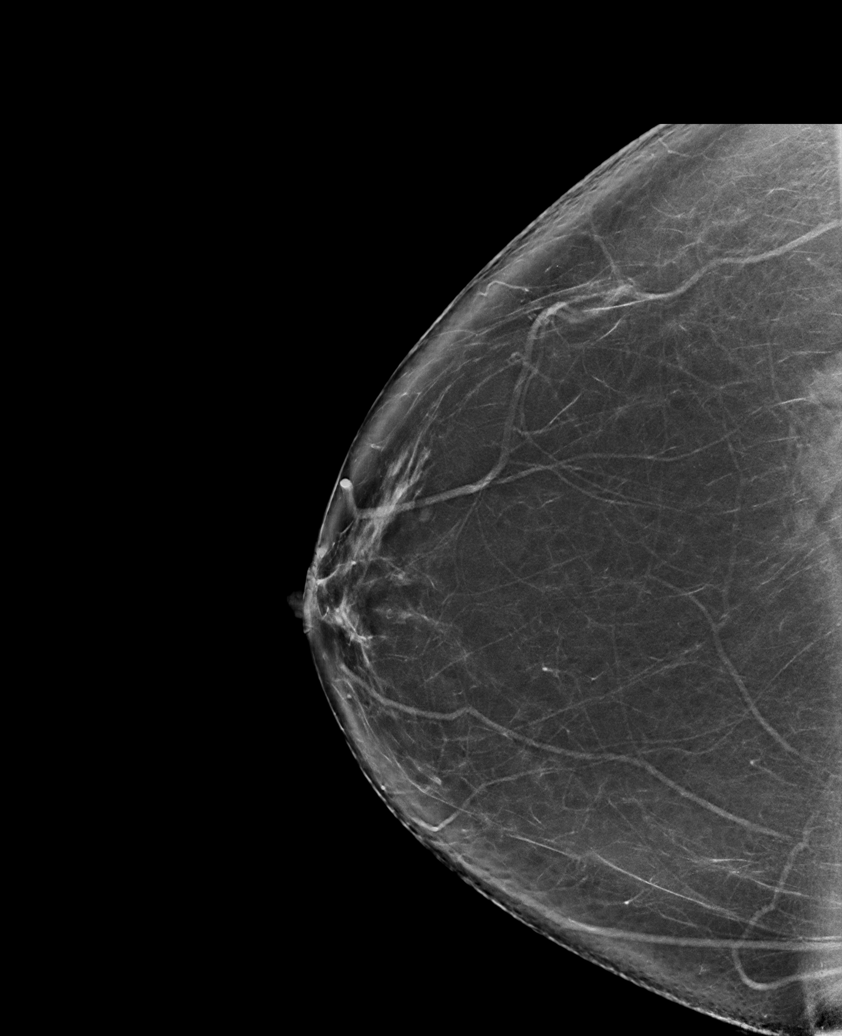

[L MLO synth-2D]
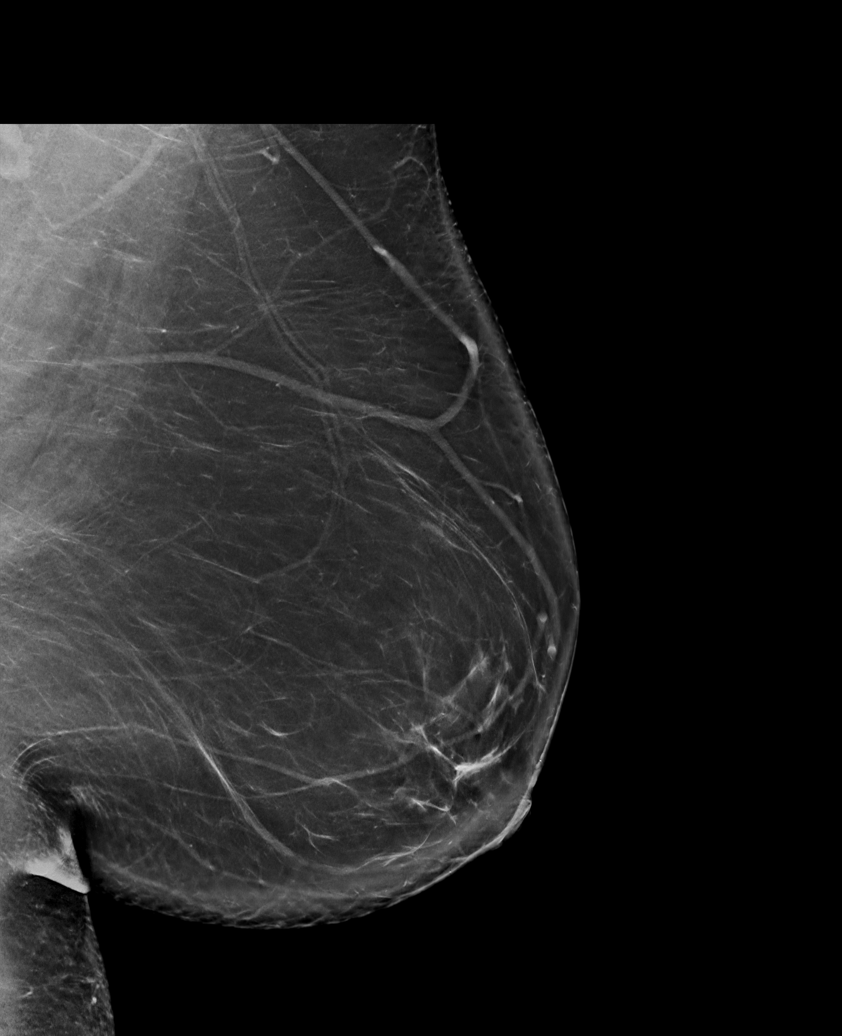

[R CV synth-2D]
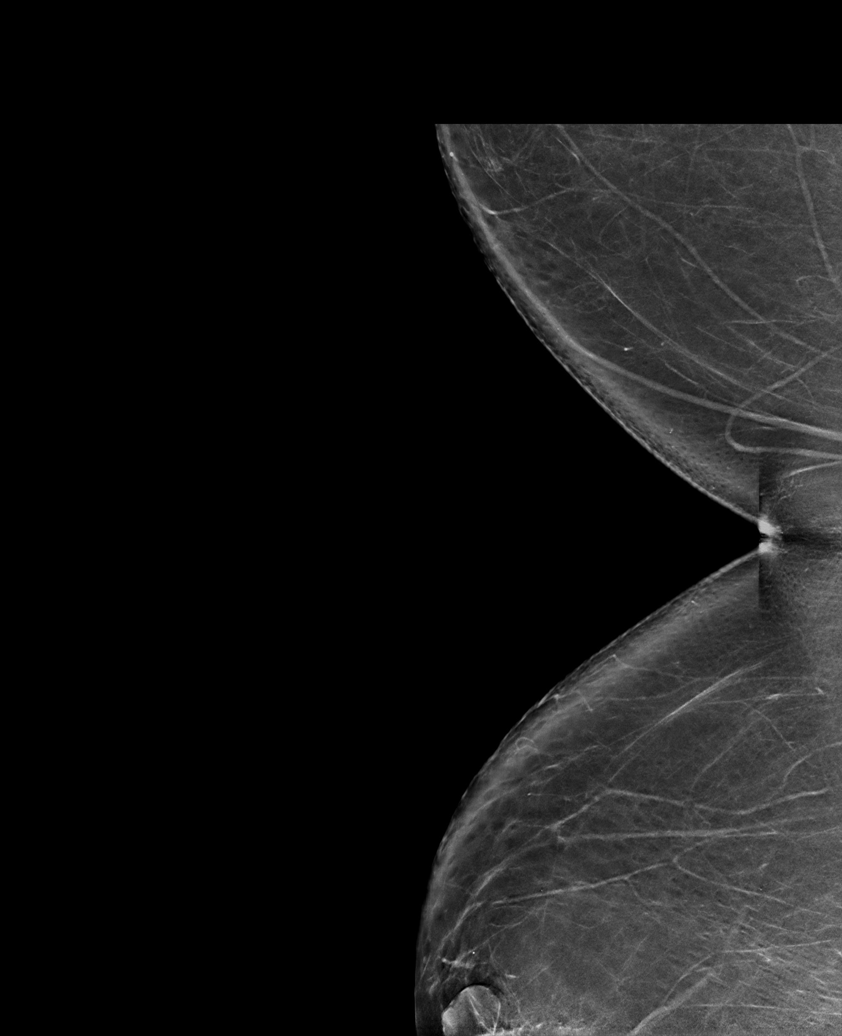

[L CC synth-2D]
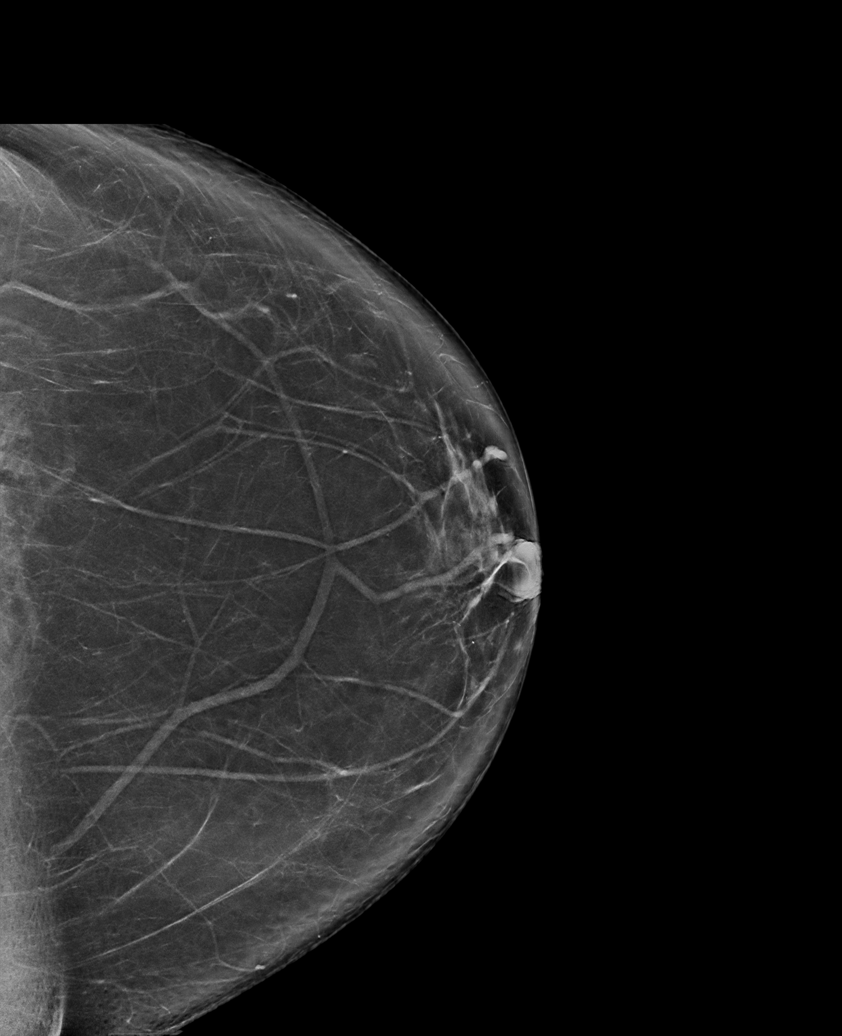

[L CC tomo · tomo slice 47/93.0]
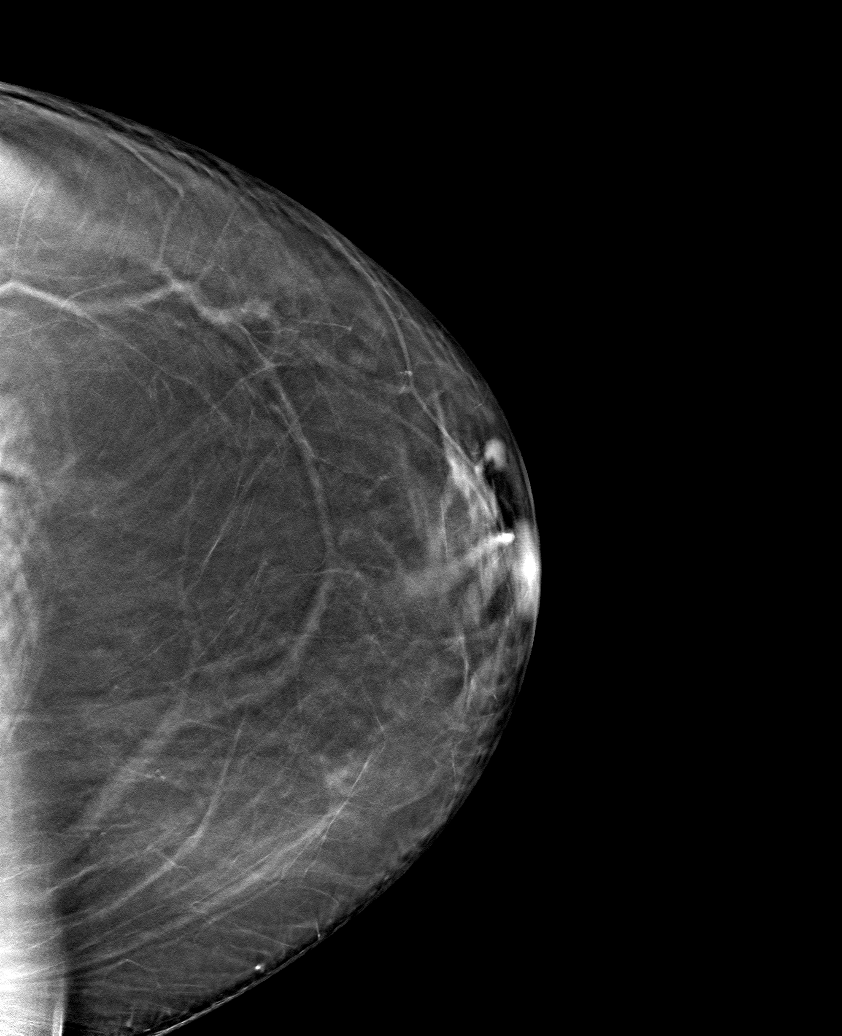

[6 of 30 positions shown; findings below may reference images not displayed]

FINDINGS: There are no findings suspicious for malignancy.
IMPRESSION: No mammographic evidence of malignancy. A result letter of this
screening mammogram will be mailed directly to the patient.

RECOMMENDATION:
Screening mammogram in one year. (Code:[8U])

BI-RADS CATEGORY  1: Negative.

## 2021-11-01 ENCOUNTER — Ambulatory Visit (INDEPENDENT_AMBULATORY_CARE_PROVIDER_SITE_OTHER): Payer: Medicaid Other | Admitting: Vascular Surgery

## 2021-11-01 ENCOUNTER — Encounter (INDEPENDENT_AMBULATORY_CARE_PROVIDER_SITE_OTHER): Payer: Self-pay

## 2021-11-10 ENCOUNTER — Other Ambulatory Visit: Payer: Self-pay | Admitting: Nurse Practitioner

## 2021-11-10 DIAGNOSIS — K7469 Other cirrhosis of liver: Secondary | ICD-10-CM

## 2021-11-21 ENCOUNTER — Ambulatory Visit
Admission: RE | Admit: 2021-11-21 | Discharge: 2021-11-21 | Disposition: A | Discharge status: Home / Self Care | Payer: Medicaid Other | Source: Ambulatory Visit | Attending: Nurse Practitioner | Admitting: Nurse Practitioner

## 2021-11-21 DIAGNOSIS — K7469 Other cirrhosis of liver: Secondary | ICD-10-CM

## 2021-12-19 ENCOUNTER — Encounter (INDEPENDENT_AMBULATORY_CARE_PROVIDER_SITE_OTHER): Payer: Self-pay

## 2022-05-11 ENCOUNTER — Other Ambulatory Visit: Payer: Self-pay | Admitting: Nurse Practitioner

## 2022-05-11 DIAGNOSIS — K7469 Other cirrhosis of liver: Secondary | ICD-10-CM

## 2022-05-16 ENCOUNTER — Other Ambulatory Visit: Payer: Self-pay | Admitting: Physician Assistant

## 2022-05-16 DIAGNOSIS — Z1231 Encounter for screening mammogram for malignant neoplasm of breast: Secondary | ICD-10-CM

## 2022-05-24 ENCOUNTER — Other Ambulatory Visit: Payer: Medicaid Other

## 2022-06-09 ENCOUNTER — Ambulatory Visit
Admission: RE | Admit: 2022-06-09 | Discharge: 2022-06-09 | Disposition: A | Discharge status: Home / Self Care | Payer: Medicaid Other | Source: Ambulatory Visit | Attending: Nurse Practitioner | Admitting: Nurse Practitioner

## 2022-06-09 DIAGNOSIS — K7469 Other cirrhosis of liver: Secondary | ICD-10-CM

## 2022-06-29 ENCOUNTER — Ambulatory Visit
Admission: RE | Admit: 2022-06-29 | Discharge: 2022-06-29 | Disposition: A | Discharge status: Home / Self Care | Payer: Medicaid Other | Source: Ambulatory Visit | Attending: Physician Assistant | Admitting: Physician Assistant

## 2022-06-29 DIAGNOSIS — Z1231 Encounter for screening mammogram for malignant neoplasm of breast: Secondary | ICD-10-CM

## 2022-11-13 ENCOUNTER — Other Ambulatory Visit: Payer: Self-pay | Admitting: Nurse Practitioner

## 2022-11-13 DIAGNOSIS — K7469 Other cirrhosis of liver: Secondary | ICD-10-CM

## 2022-11-13 DIAGNOSIS — F1090 Alcohol use, unspecified, uncomplicated: Secondary | ICD-10-CM

## 2022-11-21 ENCOUNTER — Ambulatory Visit
Admission: RE | Admit: 2022-11-21 | Discharge: 2022-11-21 | Disposition: A | Discharge status: Home / Self Care | Payer: Medicaid Other | Source: Ambulatory Visit | Attending: Nurse Practitioner

## 2022-11-21 DIAGNOSIS — K7469 Other cirrhosis of liver: Secondary | ICD-10-CM

## 2022-11-21 DIAGNOSIS — K760 Fatty (change of) liver, not elsewhere classified: Secondary | ICD-10-CM

## 2023-02-16 ENCOUNTER — Emergency Department (HOSPITAL_COMMUNITY): Payer: Medicaid Other

## 2023-02-16 ENCOUNTER — Other Ambulatory Visit: Payer: Self-pay

## 2023-02-16 ENCOUNTER — Emergency Department (HOSPITAL_COMMUNITY)
Admission: EM | Admit: 2023-02-16 | Discharge: 2023-02-16 | Discharge status: Against Medical Advice | Payer: Medicaid Other | Attending: Emergency Medicine | Admitting: Emergency Medicine

## 2023-02-16 DIAGNOSIS — Z5321 Procedure and treatment not carried out due to patient leaving prior to being seen by health care provider: Secondary | ICD-10-CM | POA: Insufficient documentation

## 2023-02-16 DIAGNOSIS — R072 Precordial pain: Secondary | ICD-10-CM | POA: Insufficient documentation

## 2023-02-16 DIAGNOSIS — R0602 Shortness of breath: Secondary | ICD-10-CM | POA: Insufficient documentation

## 2023-02-16 LAB — BASIC METABOLIC PANEL
Anion gap: 11 (ref 5–15)
BUN: 8 mg/dL (ref 6–20)
CO2: 21 mmol/L — ABNORMAL LOW (ref 22–32)
Calcium: 8.7 mg/dL — ABNORMAL LOW (ref 8.9–10.3)
Chloride: 107 mmol/L (ref 98–111)
Creatinine, Ser: 0.67 mg/dL (ref 0.44–1.00)
GFR, Estimated: >60 mL/min (ref >60)
Glucose, Bld: 91 mg/dL (ref 70–99)
Potassium: 4.3 mmol/L (ref 3.5–5.1)
Sodium: 139 mmol/L (ref 135–145)

## 2023-02-16 LAB — CBC
HCT: 39.3 % (ref 36.0–46.0)
Hemoglobin: 12.9 g/dL (ref 12.0–15.0)
MCH: 30.5 pg (ref 26.0–34.0)
MCHC: 32.8 g/dL (ref 30.0–36.0)
MCV: 92.9 fL (ref 80.0–100.0)
Platelets: 222 10*3/uL (ref 150–400)
RBC: 4.23 MIL/uL (ref 3.87–5.11)
RDW: 13.2 % (ref 11.5–15.5)
WBC: 5.2 10*3/uL (ref 4.0–10.5)
nRBC: 0 % (ref 0.0–0.2)

## 2023-02-16 LAB — HCG, SERUM, QUALITATIVE: Preg, Serum: NEGATIVE

## 2023-02-16 LAB — TROPONIN I (HIGH SENSITIVITY): Troponin I (High Sensitivity): <2 ng/L (ref <18)

## 2023-02-16 NOTE — ED Triage Notes (Signed)
Pt c/o mid sternal CP radiating to her back and L arm for one week. Pt states that when she arrived at ED pain exacerbated while walking in, causing her to fall to her knees. Pt also endorses SOB.

## 2023-05-16 ENCOUNTER — Other Ambulatory Visit: Payer: Self-pay | Admitting: Nurse Practitioner

## 2023-05-16 DIAGNOSIS — K7469 Other cirrhosis of liver: Secondary | ICD-10-CM

## 2023-05-16 DIAGNOSIS — K760 Fatty (change of) liver, not elsewhere classified: Secondary | ICD-10-CM

## 2023-05-18 ENCOUNTER — Other Ambulatory Visit

## 2023-05-25 ENCOUNTER — Ambulatory Visit
Admission: RE | Admit: 2023-05-25 | Discharge: 2023-05-25 | Disposition: A | Discharge status: Home / Self Care | Source: Ambulatory Visit | Attending: Nurse Practitioner | Admitting: Nurse Practitioner

## 2023-05-25 DIAGNOSIS — K7469 Other cirrhosis of liver: Secondary | ICD-10-CM

## 2023-05-25 DIAGNOSIS — K760 Fatty (change of) liver, not elsewhere classified: Secondary | ICD-10-CM

## 2023-06-20 NOTE — Progress Notes (Signed)
 GYNECOLOGY: ANNUAL EXAM   Subjective:    PCP: Aloha Arnold, PA-C Megan [name] is a 43 y.o. female G2P0020 who presents for annual wellness visit. No concerns today.  Well Woman Visit:  GYN HISTORY:  Patient's last menstrual period was 06/06/2023.     Menstrual History: OB History     Gravida  2   Para      Term      Preterm      AB  2   Living         SAB  1   IAB      Ectopic      Multiple      Live Births              Menarche age: 67 Patient's last menstrual period was 06/06/2023. Period Duration (Days): 7 Period Pattern: (!) Irregular Menstrual Flow: Heavy Menstrual Control: Tampon, Maxi pad, Thin pad Menstrual Control Change Freq (Hours): 2-3 Dysmenorrhea: (!) Mild Dysmenorrhea Symptoms: Cramping   Urinary incontinence? no  Sexually active: no Number of sexual partners: 0 Gender of sexual Partners: male Social History   Substance and Sexual Activity  Sexual Activity Not Currently   Birth control/protection: None   Comment: last period two weeks ago, negative pregnancy test 08/06/20 no sexual activity 3 months per pt. Dr Piedad Brewer.   Contraceptive methods: no method Dyspareunia? no STI history: HEP C  STI/HIV testing or immunizations needed? No.   Health Maintenance: -Last pap: unsure years ago 2007  --> Any abnormals: yes , had a leep 1999 -Last mammogram: 06/29/22 --> Any abnormals? no -Last colon cancer screen: 05/20/21 / Type: colonoscopy -Last DEXA scan: no -[facility] Breast / Colon / Cervical cancer: yes -Vaccines:  Immunization History  Administered Date(s) Administered   Moderna Sars-Covid-2 Vaccination 05/12/2019, 06/09/2019   Tdap 11/12/2013   Last Tdap: utd / Flu: utd / COVID: utd / Gardasil: unsure / Shingles (50+): n/a / PCV20: n/a -Hep C screen: completed -Last lipid / glucose screening: pcp  > Exercise:, moderately active > Dietary Supplements: Folate: No;  Calcium : No}; Vitamin D: Yes > Body mass index is  37.8 kg/m.  > Recent dental visit Yes.   > Seat Belt Use: Yes.   > Texting and driving? No. > Guns in the house No. > Recreational or other drug use: denied.   Social History   Tobacco Use   Smoking status: Former    Current packs/day: 0.00    Average packs/day: 0.5 packs/day for 15.0 years (7.5 ttl pk-yrs)    Types: Cigarettes    Start date: 03/29/2000    Quit date: 03/30/2015    Years since quitting: 8.2   Smokeless tobacco: Never  Substance Use Topics   Alcohol use: Not Currently    Comment: social   Occupation: not working     Lives with: daughter and grandkids  PHQ-2 Score: In last two weeks, how often have you felt: Little interest or pleasure in doing things: Not at all (0) Feeling down, depressed or hopeless: Not at all (0) Score: 0  GAD-2 Over the last 2 weeks, how often have you been bothered by the following problems? Feeling nervous, anxious or on edge: Not at all (0) Not being able to stop or control worrying: Not at all (0)} Score: 0 _________________________________________________________  Current Outpatient Medications  Medication Sig Dispense Refill   atorvastatin  (LIPITOR) 40 MG tablet Take 40 mg by mouth daily.     diazepam  (VALIUM ) 5  MG tablet Take 5 mg by mouth daily as needed for anxiety.     meloxicam (MOBIC) 7.5 MG tablet meloxicam 7.5 mg tablet  Take 1 tablet twice a day by oral route with meals.     omeprazole (PRILOSEC) 20 MG capsule Take 20 mg by mouth daily.      timolol  (TIMOPTIC ) 0.5 % ophthalmic solution Place 1 drop into both eyes 2 (two) times daily.     ALPRAZolam  (XANAX ) 0.5 MG tablet Take one tab one hour before procedure and one tab when you arrive in office (Patient not taking: Reported on 04/29/2021) 2 tablet 0   celecoxib  (CELEBREX ) 200 MG capsule Take 200 mg by mouth 2 (two) times daily.     cyclobenzaprine  (FLEXERIL ) 5 MG tablet Take 1 tablet (5 mg total) by mouth 3 (three) times daily as needed (muscle pain/aches). (Patient not  taking: Reported on 04/29/2021) 20 tablet 0   Netarsudil -Latanoprost  (ROCKLATAN ) 0.02-0.005 % SOLN Place 1 drop into both eyes at bedtime.     ondansetron  (ZOFRAN ) 4 MG tablet Take 1 tablet (4 mg total) by mouth every 8 (eight) hours as needed for nausea or vomiting. (Patient not taking: Reported on 04/29/2021) 30 tablet 0   ondansetron  (ZOFRAN ) 4 MG tablet Take 1 tablet (4 mg total) by mouth every 6 (six) hours as needed for nausea or vomiting. (Patient not taking: Reported on 04/29/2021) 20 tablet 0   oxyCODONE  10 MG TABS Take 1 tablet (10 mg total) by mouth every 4 (four) hours as needed for severe pain ((score 7 to 10)). 30 tablet 0   No current facility-administered medications for this visit.   Allergies  Allergen Reactions   Levofloxacin Itching    Other reaction(s): Itching   Lisinopril  Anaphylaxis and Swelling    Other reaction(s): Anaphylaxis, Angioedema (ALLERGY/intolerance), Swelling Other reaction(s): Angioedema    Varenicline Nausea And Vomiting   Clindamycin  Nausea And Vomiting    Other reaction(s): Nausea And Vomiting   Penicillins Other (See Comments)    Childhood reaction    Past Medical History:  Diagnosis Date   Cirrhosis (HCC)    Diverticulosis    Dyslipidemia    GERD (gastroesophageal reflux disease)    H/O ileostomy    Hepatitis C    s/p treatment   Hypercholesteremia    Obesity    PCOS (polycystic ovarian syndrome)    PTSD (post-traumatic stress disorder)    Steatosis (HCC)    Past Surgical History:  Procedure Laterality Date   ANTERIOR CERVICAL DECOMP/DISCECTOMY FUSION N/A 08/10/2020   Procedure: Cervical Six-Seven Anterior cervical decompression/discectomy/fusion;  Surgeon: Cannon Champion, MD;  Location: [facility] OR;  Service: Neurosurgery;  Laterality: N/A;  Cervical Six-Seven Anterior cervical decompression/discectomy/fusion   BACK SURGERY     COLON SURGERY  09/2018   @ [facility]; and again 12/2018   FRACTURE SURGERY     left hand   HAND SURGERY Left     HERNIA REPAIR  09/2019   Incisional hernia repair   KNEE ARTHROSCOPY Right    LIVER BIOPSY     LUMBAR LAMINECTOMY/DECOMPRESSION MICRODISCECTOMY N/A 07/15/2018   Procedure: L5-S1 MINIMALLY INVASIVE LUMBAR DISCECTOMY;  Surgeon: Cannon Champion, MD;  Location: [facility] OR;  Service: Neurosurgery;  Laterality: N/A;   SHOULDER ARTHROSCOPY WITH LABRAL REPAIR Left 04/20/2020   Procedure: LEFT SHOULDER ARTHROSCOPY WITH LABRAL REPAIR, SUBACROMIAL DEBRIDEMENT;  Surgeon: Rande Bushy, MD;  Location: [facility] ORS;  Service: Orthopedics;  Laterality: Left;   TONSILLECTOMY     UPPER EXTREMITY VENOGRAPHY Left  08/26/2020   Procedure: UPPER EXTREMITY VENOGRAPHY;  Surgeon: Celso College, MD;  Location: [facility] INVASIVE CV LAB;  Service: Cardiovascular;  Laterality: Left;    Review Of Systems  Constitutional: Denied constitutional symptoms, night sweats, recent illness, fatigue, fever, insomnia and weight loss.  Eyes: Denied eye symptoms, eye pain, photophobia, vision change and visual disturbance.  Ears/Nose/Throat/Neck: Denied ear, nose, throat or neck symptoms, hearing loss, nasal discharge, sinus congestion and sore throat.  Cardiovascular: Denied cardiovascular symptoms, arrhythmia, chest pain/pressure, edema, exercise intolerance, orthopnea and palpitations.  Respiratory: Denied pulmonary symptoms, asthma, pleuritic pain, productive sputum, cough, dyspnea and wheezing.  Gastrointestinal: Denied, gastro-esophageal reflux, melena, nausea and vomiting.  Genitourinary: Denied genitourinary symptoms including symptomatic vaginal discharge, pelvic relaxation issues, and urinary complaints.  Musculoskeletal: Denied musculoskeletal symptoms, stiffness, swelling, muscle weakness and myalgia.  Dermatologic: Denied dermatology symptoms, rash and scar.  Neurologic: Denied neurology symptoms, dizziness, headache, neck pain and syncope.  Psychiatric: Denied psychiatric symptoms, anxiety and depression.  Endocrine: Denied  endocrine symptoms including hot flashes and night sweats.      Objective:    BP 124/73   Pulse 80   Ht 5\' 9"  (1.753 m)   Wt 256 lb (116.1 kg)   LMP 06/06/2023   BMI 37.80 kg/m   Constitutional: Well-developed, well-nourished female in no acute distress Neurological: Alert and oriented to person, place, and time Psychiatric: Mood and affect: excessive laughing Skin: No rashes or lesions; tattoos Neck: Supple without masses. Trachea is midline.Thyroid is normal size without masses Lymphatics: No cervical, axillary, supraclavicular, or inguinal adenopathy noted Respiratory: Clear to auscultation bilaterally. Good air movement with normal work of breathing. Cardiovascular: Regular rate and rhythm. Extremities grossly normal, nontender with no edema; pulses regular Gastrointestinal: Soft, nontender, nondistended. No masses or hernias appreciated. No hepatosplenomegaly. No fluid wave. No rebound or guarding. Breast Exam: normal appearance, no masses or tenderness, Inspection negative, No nipple retraction or dimpling, No nipple discharge or bleeding, No axillary or supraclavicular adenopathy, Normal to palpation without dominant masses Genitourinary:         External Genitalia: Normal female genitalia    Vagina: Normal mucosa, no lesions.    Cervix: No lesions, normal size and consistency; no cervical motion tenderness; non-friable; Pap obtained.    Uterus: Normal size and contour; smooth, mobile, NT. Adnexae: Non-palpable and non-tender Perineum/Anus: No lesions Rectal: deferred    Assessment/Plan:    Megan [name] is a 43 y.o. female G2P0020 with normal well-woman gynecologic exam.  -Screenings:  Pap: done with cotesting today  Mammogram: ordered Labs: PCP GAD/PHQ-2 = 0 -Contraception: none -Vaccines: Recommend Gardasil  -Healthy lifestyle modifications discussed: multivitamin, diet, exercise, sunscreen, tobacco and alcohol use. Emphasized importance of regular physical  activity.  -Folate, Calcium  and Vit D recommendation reviewed.  -All questions answered to patient's satisfaction.   Return in about 1 year (around 06/20/2024) for annunal.    Sofia Dunn, DO [facility] at [facility]

## 2023-06-21 ENCOUNTER — Encounter: Payer: Self-pay | Admitting: Obstetrics

## 2023-06-21 ENCOUNTER — Ambulatory Visit: Admitting: Obstetrics

## 2023-06-21 ENCOUNTER — Other Ambulatory Visit (HOSPITAL_COMMUNITY)
Admission: RE | Admit: 2023-06-21 | Discharge: 2023-06-21 | Disposition: A | Discharge status: Home / Self Care | Source: Ambulatory Visit | Attending: Obstetrics | Admitting: Obstetrics

## 2023-06-21 VITALS — BP 124/73 | HR 80 | Ht 69.0 in | Wt 256.0 lb

## 2023-06-21 DIAGNOSIS — Z124 Encounter for screening for malignant neoplasm of cervix: Secondary | ICD-10-CM | POA: Insufficient documentation

## 2023-06-21 DIAGNOSIS — Z01419 Encounter for gynecological examination (general) (routine) without abnormal findings: Secondary | ICD-10-CM | POA: Diagnosis not present

## 2023-06-21 DIAGNOSIS — Z1231 Encounter for screening mammogram for malignant neoplasm of breast: Secondary | ICD-10-CM

## 2023-06-21 NOTE — Patient Instructions (Signed)
 Preventive Care 16-44 Years Old, Female  Preventive care refers to lifestyle choices and visits with your health care provider that can promote health and wellness. Preventive care visits are also called wellness exams.  What can I expect for my preventive care visit?  Counseling  Your health care provider may ask you questions about your:  Medical history, including:  Past medical problems.  Family medical history.  Pregnancy history.  Current health, including:  Menstrual cycle.  Method of birth control.  Emotional well-being.  Home life and relationship well-being.  Sexual activity and sexual health.  Lifestyle, including:  Alcohol, nicotine or tobacco, and drug use.  Access to firearms.  Diet, exercise, and sleep habits.  Work and work Astronomer.  Sunscreen use.  Safety issues such as seatbelt and bike helmet use.  Physical exam  Your health care provider will check your:  Height and weight. These may be used to calculate your BMI (body mass index). BMI is a measurement that tells if you are at a healthy weight.  Waist circumference. This measures the distance around your waistline. This measurement also tells if you are at a healthy weight and may help predict your risk of certain diseases, such as type 2 diabetes and high blood pressure.  Heart rate and blood pressure.  Body temperature.  Skin for abnormal spots.  What immunizations do I need?    Vaccines are usually given at various ages, according to a schedule. Your health care provider will recommend vaccines for you based on your age, medical history, and lifestyle or other factors, such as travel or where you work.  What tests do I need?  Screening  Your health care provider may recommend screening tests for certain conditions. This may include:  Lipid and cholesterol levels.  Diabetes screening. This is done by checking your blood sugar (glucose) after you have not eaten for a while (fasting).  Pelvic exam and Pap test.  Hepatitis B test.  Hepatitis C  test.  HIV (human immunodeficiency virus) test.  STI (sexually transmitted infection) testing, if you are at risk.  Lung cancer screening.  Colorectal cancer screening.  Mammogram. Talk with your health care provider about when you should start having regular mammograms. This may depend on whether you have a family history of breast cancer.  BRCA-related cancer screening. This may be done if you have a family history of breast, ovarian, tubal, or peritoneal cancers.  Bone density scan. This is done to screen for osteoporosis.  Talk with your health care provider about your test results, treatment options, and if necessary, the need for more tests.  Follow these instructions at home:  Eating and drinking    Eat a diet that includes fresh fruits and vegetables, whole grains, lean protein, and low-fat dairy products.  Take vitamin and mineral supplements as recommended by your health care provider.  Do not drink alcohol if:  Your health care provider tells you not to drink.  You are pregnant, may be pregnant, or are planning to become pregnant.  If you drink alcohol:  Limit how much you have to 0-1 drink a day.  Know how much alcohol is in your drink. In the [place] one drink equals one 12 oz bottle of beer (355 mL), one 5 oz glass of wine (148 mL), or one 1 oz glass of hard liquor (44 mL).  Lifestyle  Brush your teeth every morning and night with fluoride toothpaste. Floss one time each day.  Exercise for at least  30 minutes 5 or more days each week.  Do not use any products that contain nicotine or tobacco. These products include cigarettes, chewing tobacco, and vaping devices, such as e-cigarettes. If you need help quitting, ask your health care provider.  Do not use drugs.  If you are sexually active, practice safe sex. Use a condom or other form of protection to prevent STIs.  If you do not wish to become pregnant, use a form of birth control. If you plan to become pregnant, see your health care provider for a  prepregnancy visit.  Take aspirin only as told by your health care provider. Make sure that you understand how much to take and what form to take. Work with your health care provider to find out whether it is safe and beneficial for you to take aspirin daily.  Find healthy ways to manage stress, such as:  Meditation, yoga, or listening to music.  Journaling.  Talking to a trusted person.  Spending time with friends and family.  Minimize exposure to UV radiation to reduce your risk of skin cancer.  Safety  Always wear your seat belt while driving or riding in a vehicle.  Do not drive:  If you have been drinking alcohol. Do not ride with someone who has been drinking.  When you are tired or distracted.  While texting.  If you have been using any mind-altering substances or drugs.  Wear a helmet and other protective equipment during sports activities.  If you have firearms in your house, make sure you follow all gun safety procedures.  Seek help if you have been physically or sexually abused.  What's next?  Visit your health care provider once a year for an annual wellness visit.  Ask your health care provider how often you should have your eyes and teeth checked.  Stay up to date on all vaccines.  This information is not intended to replace advice given to you by your health care provider. Make sure you discuss any questions you have with your health care provider.  Document Revised: 08/04/2020 Document Reviewed: 08/04/2020  Elsevier Patient Education  2024 [employer].

## 2023-06-25 LAB — CYTOLOGY - PAP
Chlamydia: NEGATIVE
Comment: NEGATIVE
Comment: NEGATIVE
Comment: NEGATIVE
Comment: NORMAL
Diagnosis: NEGATIVE
High risk HPV: NEGATIVE
Neisseria Gonorrhea: NEGATIVE
Trichomonas: NEGATIVE

## 2023-06-26 ENCOUNTER — Encounter: Payer: Self-pay | Admitting: Obstetrics

## 2023-07-12 ENCOUNTER — Other Ambulatory Visit: Payer: Self-pay | Admitting: Obstetrics

## 2023-07-12 DIAGNOSIS — Z1231 Encounter for screening mammogram for malignant neoplasm of breast: Secondary | ICD-10-CM

## 2023-07-18 ENCOUNTER — Ambulatory Visit
Admission: RE | Admit: 2023-07-18 | Discharge: 2023-07-18 | Disposition: A | Discharge status: Home / Self Care | Source: Ambulatory Visit

## 2023-07-18 DIAGNOSIS — Z1231 Encounter for screening mammogram for malignant neoplasm of breast: Secondary | ICD-10-CM

## 2023-07-19 ENCOUNTER — Other Ambulatory Visit: Payer: Self-pay

## 2023-07-19 DIAGNOSIS — E669 Obesity, unspecified: Secondary | ICD-10-CM | POA: Insufficient documentation

## 2023-07-19 DIAGNOSIS — E7800 Pure hypercholesterolemia, unspecified: Secondary | ICD-10-CM | POA: Insufficient documentation

## 2023-07-19 DIAGNOSIS — R0789 Other chest pain: Secondary | ICD-10-CM | POA: Insufficient documentation

## 2023-07-19 DIAGNOSIS — K5790 Diverticulosis of intestine, part unspecified, without perforation or abscess without bleeding: Secondary | ICD-10-CM | POA: Insufficient documentation

## 2023-07-19 DIAGNOSIS — K219 Gastro-esophageal reflux disease without esophagitis: Secondary | ICD-10-CM | POA: Insufficient documentation

## 2023-07-19 DIAGNOSIS — B182 Chronic viral hepatitis C: Secondary | ICD-10-CM | POA: Insufficient documentation

## 2023-07-19 DIAGNOSIS — K7460 Unspecified cirrhosis of liver: Secondary | ICD-10-CM | POA: Insufficient documentation

## 2023-07-19 DIAGNOSIS — E8889 Other specified metabolic disorders: Secondary | ICD-10-CM | POA: Insufficient documentation

## 2023-07-19 DIAGNOSIS — B1920 Unspecified viral hepatitis C without hepatic coma: Secondary | ICD-10-CM | POA: Insufficient documentation

## 2023-07-23 ENCOUNTER — Ambulatory Visit

## 2023-07-23 VITALS — BP 134/90 | HR 70 | Ht 69.0 in | Wt 257.4 lb

## 2023-07-23 DIAGNOSIS — R002 Palpitations: Secondary | ICD-10-CM

## 2023-07-23 DIAGNOSIS — R079 Chest pain, unspecified: Secondary | ICD-10-CM | POA: Insufficient documentation

## 2023-07-23 DIAGNOSIS — I10 Essential (primary) hypertension: Secondary | ICD-10-CM | POA: Diagnosis not present

## 2023-07-23 DIAGNOSIS — R0789 Other chest pain: Secondary | ICD-10-CM | POA: Diagnosis not present

## 2023-07-23 DIAGNOSIS — E7800 Pure hypercholesterolemia, unspecified: Secondary | ICD-10-CM | POA: Insufficient documentation

## 2023-07-23 DIAGNOSIS — R9431 Abnormal electrocardiogram [ECG] [EKG]: Secondary | ICD-10-CM | POA: Diagnosis not present

## 2023-07-23 NOTE — Assessment & Plan Note (Signed)
 Borderline elevated blood pressures. Currently not on any treatment for blood pressure. Advised to continue monitoring closely at home.  If persistently elevated above 130/80 mmHg would recommend antihypertensive medications. Will review further at subsequent follow-up visit.

## 2023-07-23 NOTE — Assessment & Plan Note (Signed)
 Atypical chest pain. Has risk factors in the form of obesity, dyslipidemia, family history, history of smoking.  Will further cardiac CT coronary angiogram. Symptoms could also be related to cardiac arrhythmias, will proceed with 2-week Zio patch monitor. Will also obtain cardiac structure and function assessment with transthoracic echocardiogram.

## 2023-07-23 NOTE — Patient Instructions (Signed)
 Medication Instructions:  Your physician recommends that you continue on your current medications as directed. Please refer to the Current Medication list given to you today.  *If you need a refill on your cardiac medications before your next appointment, please call your pharmacy*   Lab Work: None ordered If you have labs (blood work) drawn today and your tests are completely normal, you will receive your results only by: MyChart Message (if you have MyChart) OR A paper copy in the mail If you have any lab test that is abnormal or we need to change your treatment, we will call you to review the results.   Testing/Procedures: Your physician has requested that you have an echocardiogram. Echocardiography is a painless test that uses sound waves to create images of your heart. It provides your doctor with information about the size and shape of your heart and how well your heart's chambers and valves are working. This procedure takes approximately one hour. There are no restrictions for this procedure. Please do NOT wear cologne, perfume, aftershave, or lotions (deodorant is allowed). Please arrive 15 minutes prior to your appointment time.  Please note: We ask at that you not bring children with you during ultrasound (echo/ vascular) testing. Due to room size and safety concerns, children are not allowed in the ultrasound rooms during exams. Our front office staff cannot provide observation of children in our lobby area while testing is being conducted. An adult accompanying a patient to their appointment will only be allowed in the ultrasound room at the discretion of the ultrasound technician under special circumstances. We apologize for any inconvenience.  A zio monitor was ordered today. It will remain on for 14 days. Remove 08/06/23. You will then return monitor and event diary in provided box. It takes 1-2 weeks for report to be downloaded and returned to us . We will call you with the  results. If monitor falls off or has orange flashing light, please call Zio for further instructions.    Your cardiac CT will be scheduled at one of the below locations:   [facility] [address] [place], [place] [address] [phone]  OR  [facility] [address] [place], [place] [address] [phone]  OR   [facility] [address] [place], [place] [address] [phone]  OR   [facility] [address] [place], [place] [address] [phone]  OR   [facility] and Vascular Tower [address]  [place], [place] [address] Opening June 18, 2023  If scheduled at [facility], please arrive at the [facility] and Children's Entrance (Entrance C2) of [facility] 30 minutes prior to test start time. You can use the FREE valet parking offered at entrance C (encouraged to control the heart rate for the test)  Proceed to the [facility] Radiology Department (first floor) to check-in and test prep.   All radiology patients and guests should use entrance C2 at [facility], accessed from [facility], even though the hospital's physical address listed is [address]    If scheduled at the Heart and Vascular Tower at [address], please enter the parking lot using the [address] entrance and use the FREE valet service at the patient drop-off area. Enter the buidling and check-in with registration on the main floor.  If scheduled at [facility] or [facility], please arrive 51  mins early for check-in and test prep.  There is spacious parking and easy access to the radiology department from the [facility] Heart and Vascular entrance. Please enter here and check-in with the desk attendant.   If scheduled at [facility], please arrive 30 minutes early for check-in  and test prep.  Please follow these instructions carefully (unless otherwise directed):  An IV will be required for this test and Nitroglycerin  will be given.    On the Night Before the Test: Be sure to Drink plenty of water. Do not consume any caffeinated/decaffeinated beverages or chocolate 12 hours prior to your test. Do not take any antihistamines 12 hours prior to your test.  On the Day of the Test: Drink plenty of water until 1 hour prior to the test. Do not eat any food 1 hour prior to test. You may take your regular medications prior to the test.  Take metoprolol (Lopressor) 100 mg (1 tablet) two hours prior to test.This is a one time dose. FEMALES- please wear underwire-free bra if available, avoid dresses & tight clothing  After the Test: Drink plenty of water. After receiving IV contrast, you may experience a mild flushed feeling. This is normal. On occasion, you may experience a mild rash up to 24 hours after the test. This is not dangerous. If this occurs, you can take Benadryl  25 mg, Zyrtec, Claritin, or Allegra and increase your fluid intake. (Patients taking Tikosyn should avoid Benadryl , and may take Zyrtec, Claritin, or Allegra) If you experience trouble breathing, this can be serious. If it is severe call 911 IMMEDIATELY. If it is mild, please call our office.  We will call to schedule your test 2-4 weeks out understanding that some insurance companies will need an authorization prior to the service being performed.   For more information and frequently asked questions, please visit our website : [email or web address]  For non-scheduling related questions, please contact the cardiac imaging nurse navigator should you have any questions/concerns: Cardiac Imaging Nurse Navigators Direct Office Dial: [phone]   For scheduling needs, including cancellations and rescheduling, please call [place], [phone].    Follow-Up: At [facility], you and your health needs are our priority.  As part of our continuing mission to provide you with exceptional heart care, we have created designated Provider Care Teams.  These Care Teams include your primary Cardiologist (physician) and Advanced Practice Providers (APPs -  Physician Assistants and Nurse Practitioners) who all work together to provide you with the care you need, when you need it.  We recommend signing up for the patient portal called "MyChart".  Sign up information is provided on this After Visit Summary.  MyChart is used to connect with patients for Virtual Visits (Telemedicine).  Patients are able to view lab/test results, encounter notes, upcoming appointments, etc.  Non-urgent messages can be sent to your provider as well.   To learn more about what you can do with MyChart, go to [email or web address].    Your next appointment:   2 month(s)  The format for your next appointment:   In Person  Provider:   Bertha Broad, MD    Other Instructions none  Important Information About Sugar

## 2023-07-23 NOTE — Progress Notes (Signed)
 Cardiology Consultation:    Date:  07/23/2023   ID:  Megan [name], DOB 03/17/1980, MRN [id number]  PCP:  Megan Arnold, PA-C  Cardiologist:  Daymon Evans Charm Stenner, MD   Referring MD: Megan Arnold, PA-C   No chief complaint on file.    ASSESSMENT AND PLAN:   [name] 43 year old woman with history of hypertension currently not on medications, hyperlipidemia, cirrhosis, chronic hepatitis C completed therapy, obesity,, chronic low back pain, prediabetes, PTSD, bipolar disorder, GERD, ruptured diverticulitis in 2020, family history of heart disease with father sustaining MI at age 69. Plan cardiac workup in 2018 with echocardiogram January 2018 noted normal biventricular function and grade 1 diastolic dysfunction and stress test with nuclear imaging February 2018 showed normal perfusion and a low risk study.  Followed up with cardiologist in March 2018. Former smoker and alcohol abuse history, quit in 2020.  Seen today for initial consult for evaluation of atypical chest pain.  Problem List Items Addressed This Visit     Essential (primary) hypertension   Borderline elevated blood pressures. Currently not on any treatment for blood pressure. Advised to continue monitoring closely at home.  If persistently elevated above 130/80 mmHg would recommend antihypertensive medications. Will review further at subsequent follow-up visit.       Chest pain, atypical - Primary   Atypical chest pain. Has risk factors in the form of obesity, dyslipidemia, family history, history of smoking.  Will further cardiac CT coronary angiogram. Symptoms could also be related to cardiac arrhythmias, will proceed with 2-week Zio patch monitor. Will also obtain cardiac structure and function assessment with transthoracic echocardiogram.       Relevant Orders   EKG 12-Lead (Completed)   Hypercholesteremia   Lipid panel from 05-11-2023 total cholesterol 170, HDL 52, LDL 104, triglycerides  76. Hemoglobin A1c 5.4. Remains on atorvastatin  40 mg once daily. Tolerating well. Continue the same.      Advised to head to the ER or call 911 if acutely worsening symptoms of chest pain. Advised to review with hepatologist if she is able to take aspirin  81 mg once daily for primary prophylaxis and start doing so if they are agreeable.  Return to clinic tentatively in 2 months.   History of Present Illness:    Megan [name] is a 43 y.o. female who is being seen today for the evaluation of chest pain at the request of Megan [name], [place]. Follows up closely with hepatologist given history of cirrhosis and hepatitis C.  Pleasant woman here for the visit by herself.  Lives at home with her boyfriend.  History of hypertension currently not on medications, hyperlipidemia, cirrhosis, chronic hepatitis C completed therapy, obesity,, chronic low back pain, prediabetes, PTSD, bipolar disorder, GERD, ruptured diverticulitis in 2020, family history of heart disease with father sustaining MI at age 19. Plan cardiac workup in 2018 with echocardiogram January 2018 noted normal biventricular function and grade 1 diastolic dysfunction and stress test with nuclear imaging February 2018 showed normal perfusion and a low risk study.  Followed up with cardiologist in March 2018. Former smoker and alcohol abuse history, quit in 2020.  In December 2024 she had a visit to the ER for symptoms of chest pain, troponins were negative.  Left home.  Over the last 6 months she has intermittent chest pains which are short lasting for few seconds to minutes at most, occur randomly, spontaneously resolved at times associated with sweating and dizziness.  Radiates to the neck and left arm.  Symptoms occur at least once or twice in a week. Denies syncopal episodes. Denies any pedal edema. Over the past year she has lost 50 pounds, while on Ozempic.  Symptoms persist.  Quit smoking and alcohol in 2020 after her  abdominal complications from diverticulitis. Has her own cleaning business but recently due to musculoskeletal issues has been taking it easy.  EKG in the clinic today shows sinus versus ectopic atrial rhythm, PR interval 192 ms, normal QRS axis, no significant ST-T changes to suggest ischemia  Past Medical History:  Diagnosis Date   Alcohol abuse 11/14/2017   Anxiety 12/23/2018   Bipolar 1 disorder (HCC) 12/23/2018   Body mass index (BMI) of 40.0 to 44.9 in adult (HCC) 05/02/2019   Chest pain, atypical    Chronic hepatitis C virus infection (HCC) 05/02/2019   Cirrhosis (HCC)    Diverticulitis 10/14/2018   Diverticulitis of sigmoid colon 09/29/2019   Diverticulosis    Dyslipidemia    Essential (primary) hypertension 05/02/2019   Last Assessment & Plan:   Formatting of this note might be different from the original.  Mildly elevated blood pressure during today's visit.  No associated symptoms.  Not currently on antihypertensives.  Reviewed DASH diet and instructed to follow up with PCP.     Fatty liver 05/22/2020   Last Assessment & Plan:   Formatting of this note might be different from the original.  Patient with a history of hepatic steatosis on biopsy and risk factors for nonalcohol associated fatty liver disease including hyperlipidemia, hypertension, and obesity.     Patient is aware that increased fat in the liver can cause ongoing scarring and progression of cirrhosis.     Reviewed that there is no t   Gastro-esophageal reflux disease without esophagitis 05/02/2019   GERD (gastroesophageal reflux disease)    Gram-negative bacteremia 05/02/2019   H/O ileostomy    Hepatic cirrhosis (HCC) 05/22/2020   Formatting of this note is different from the original.     Patient has biopsy-proven cirrhosis, well compensated.     Patient underwent 2 significant abdominal surgeries in the last year with no evidence of hepatic decompensation.     Most recent labs show well-preserved liver synthetic  function.  Patient will have labs today to reevaluate.     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   Obesity    Pain in joint of left shoulder 03/10/2020   PCOS (polycystic ovarian syndrome)    PTSD (post-traumatic stress disorder)    Pyelonephritis 10/10/2013   Sciatica 07/07/2018   Steatosis (HCC)    Stiffness of left shoulder joint 11/23/2020   Tobacco abuse 10/10/2013   Tobacco dependence 08/20/2020   UTI due to extended-spectrum beta lactamase (ESBL) producing Escherichia coli 05/03/2019   Varicose veins of leg with pain, bilateral 09/17/2020    Past Surgical History:  Procedure Laterality Date   ANTERIOR CERVICAL DECOMP/DISCECTOMY FUSION N/A 08/10/2020   Procedure: Cervical Six-Seven Anterior cervical decompression/discectomy/fusion;  Surgeon: Cannon Champion, MD;  Location: [facility] OR;  Service: Neurosurgery;  Laterality: N/A;  Cervical Six-Seven Anterior cervical decompression/discectomy/fusion   BACK SURGERY     COLON SURGERY  09/2018   @ [facility]; and again 12/2018   FRACTURE SURGERY     left hand   HAND SURGERY Left    HERNIA REPAIR  09/2019   Incisional hernia repair   KNEE ARTHROSCOPY Right    LIVER BIOPSY     LUMBAR LAMINECTOMY/DECOMPRESSION MICRODISCECTOMY N/A 07/15/2018   Procedure: L5-S1 MINIMALLY INVASIVE LUMBAR DISCECTOMY;  Surgeon: Cannon Champion, MD;  Location: [facility] OR;  Service: Neurosurgery;  Laterality: N/A;   SHOULDER ARTHROSCOPY WITH LABRAL  REPAIR Left 04/20/2020   Procedure: LEFT SHOULDER ARTHROSCOPY WITH LABRAL REPAIR, SUBACROMIAL DEBRIDEMENT;  Surgeon: Rande Bushy, MD;  Location: [facility] ORS;  Service: Orthopedics;  Laterality: Left;   TONSILLECTOMY     UPPER EXTREMITY VENOGRAPHY Left 08/26/2020   Procedure: UPPER EXTREMITY VENOGRAPHY;  Surgeon: Celso College, MD;  Location: [facility] INVASIVE CV LAB;  Service: Cardiovascular;  Laterality: Left;    Current Medications: Current Meds  Medication Sig   atorvastatin  (LIPITOR) 40 MG tablet Take 40 mg by mouth daily.   diazepam  (VALIUM ) 5 MG tablet Take 5 mg by mouth daily as needed for anxiety.   meloxicam (MOBIC) 7.5 MG tablet meloxicam 7.5 mg tablet  Take 1 tablet twice a day by oral route with meals.   omeprazole (PRILOSEC) 20 MG capsule Take 20 mg by mouth daily.    OZEMPIC, 2 MG/DOSE, 8 MG/3ML SOPN Inject 2 mg into the skin once a week.   timolol  (TIMOPTIC ) 0.5 % ophthalmic solution Place 1 drop into both eyes 2 (two) times daily.   tiZANidine (ZANAFLEX) 4 MG tablet Take 2-4 mg by mouth at bedtime as needed for muscle spasms.     Allergies:   Ace inhibitors, Levofloxacin, Lisinopril , Varenicline, Clindamycin , and Penicillins   Social History   Socioeconomic History   Marital status: Single    Spouse name: Not on file   Number of children: 0   Years of education: Not on file   Highest education level: Not on file  Occupational History   Occupation: Bartender  Tobacco Use   Smoking status: Former    Current packs/day: 0.00    Average packs/day: 0.5 packs/day for 15.0 years (7.5 ttl pk-yrs)    Types: Cigarettes    Start date: 03/29/2000    Quit date: 03/30/2015    Years since quitting: 8.3   Smokeless tobacco: Never  Vaping Use   Vaping status: Never Used  Substance and Sexual Activity   Alcohol use: Not Currently    Comment: social   Drug use: No   Sexual activity: Not Currently    Birth control/protection: None    Comment: last period two weeks ago, negative  pregnancy test 08/06/20 no sexual activity 3 months per pt. Dr Piedad Brewer.  Other Topics Concern   Not on file  Social History Narrative   ** Merged History Encounter **    room mate   Social Drivers of Health   Financial Resource Strain: High Risk (04/22/2023)   Received from [facility]   Overall Financial Resource Strain (CARDIA)    Difficulty of Paying Living Expenses: Very hard  Food Insecurity: Low Risk  (05/16/2023)   Received from [facility]   Hunger Vital Sign    Worried About Running Out of Food in the Last Year: Never true    Ran Out of Food in the Last Year: Never true  Recent Concern:  Food Insecurity - Food Insecurity Present (04/22/2023)   Received from [facility]   Hunger Vital Sign    Worried About Running Out of Food in the Last Year: Often true    Ran Out of Food in the Last Year: Often true  Transportation Needs: No Transportation Needs (05/16/2023)   Received from [facility]    In the past 12 months, has lack of reliable transportation kept you from medical appointments, meetings, work or from getting things needed for daily living? : No  Physical Activity: Sufficiently Active (04/22/2023)   Received from [facility]   Exercise Vital Sign    Days of Exercise per Week: 7 days    Minutes of Exercise per Session: 30 min  Stress: Stress Concern Present (04/22/2023)   Received from [facility] - Occupational Stress Questionnaire    Feeling of Stress : To some extent  Social Connections: Socially Integrated (04/22/2023)   Received from [facility]   Social Network    How would you rate your social network (family, work, friends)?: Good participation with social networks     Family History: The patient's family history includes Breast cancer in her maternal aunt; Cancer - Colon in her maternal grandfather; Colon cancer in her maternal grandfather; Heart attack in her father and paternal grandfather;  High blood pressure in her father and mother. ROS:   Please see the history of present illness.    All 14 point review of systems negative except as described per history of present illness.  EKGs/Labs/Other Studies Reviewed:    The following studies were reviewed today:   EKG:  EKG Interpretation Date/Time:  Monday July 23 2023 14:47:51 EDT Ventricular Rate:  69 PR Interval:  194 QRS Duration:  78 QT Interval:  400 QTC Calculation: 428 R Axis:   75  Text Interpretation: sinus vs ectopic atrial rhythm Otherwise normal ECG When compared with ECG of 23-Jul-2023 14:47, No significant change was found Confirmed by Bertha Broad reddy [phone]) on 07/23/2023 3:13:48 PM    Recent Labs: 02/16/2023: BUN 8; Creatinine, Ser 0.67; Hemoglobin 12.9; Platelets 222; Potassium 4.3; Sodium 139  Recent Lipid Panel No results found for: "CHOL", "TRIG", "HDL", "CHOLHDL", "VLDL", "LDLCALC", "LDLDIRECT"  Physical Exam:    VS:  BP (!) 134/90   Pulse 70   Ht 5\' 9"  (1.753 m)   Wt 257 lb 6.4 oz (116.8 kg)   SpO2 99%   BMI 38.01 kg/m     Wt Readings from Last 3 Encounters:  07/23/23 257 lb 6.4 oz (116.8 kg)  06/21/23 256 lb (116.1 kg)  02/16/23 247 lb (112 kg)     GENERAL:  Well nourished, well developed in no acute distress NECK: No JVD; No carotid bruits CARDIAC: RRR, S1 and S2 present, no murmurs, no rubs, no gallops CHEST:  Clear to auscultation without rales, wheezing or rhonchi  Extremities: No pitting pedal edema. Pulses bilaterally symmetric with radial 2+ and dorsalis pedis 2+ NEUROLOGIC:  Alert and oriented x 3  Medication Adjustments/Labs and Tests Ordered: Current medicines are reviewed at length with the patient today.  Concerns regarding medicines are outlined above.  Orders Placed This Encounter  Procedures   EKG 12-Lead   EKG 12-Lead   No orders of the defined types were placed in this encounter.   Signed, Lura Sallies, MD, MPH, [facility]. 07/23/2023 3:36 PM     [facility]

## 2023-07-23 NOTE — Assessment & Plan Note (Signed)
 Lipid panel from 05-11-2023 total cholesterol 170, HDL 52, LDL 104, triglycerides 76. Hemoglobin A1c 5.4. Remains on atorvastatin  40 mg once daily. Tolerating well. Continue the same.

## 2023-07-25 ENCOUNTER — Telehealth: Payer: Self-pay

## 2023-07-25 NOTE — Telephone Encounter (Signed)
 Called the patient and she stated that one side of the heart monitor was beginning to peel up and she wanted to know if she could use a small piece of tape to prevent it from coming all of the way off. I advised here that she could do that as long as the lead was flat against her left upper chest. I also advised her that if it did get worse or the heart monitor fell off then she should call the customer service number to [employer] located on the placard card, she was given at her appointment. Patient verbalized understanding and had no further questions at this time.

## 2023-07-25 NOTE — Telephone Encounter (Signed)
 Pt called in stating her heart monitor adhesive will not stay on. Please advise.

## 2023-07-31 DIAGNOSIS — M19011 Primary osteoarthritis, right shoulder: Secondary | ICD-10-CM | POA: Insufficient documentation

## 2023-07-31 HISTORY — DX: Primary osteoarthritis, right shoulder: M19.011

## 2023-08-03 ENCOUNTER — Encounter (HOSPITAL_COMMUNITY): Payer: Self-pay

## 2023-08-06 ENCOUNTER — Encounter: Payer: Self-pay | Admitting: Physical Medicine and Rehabilitation

## 2023-08-07 ENCOUNTER — Ambulatory Visit (HOSPITAL_COMMUNITY)
Admission: RE | Admit: 2023-08-07 | Discharge: 2023-08-07 | Disposition: A | Discharge status: Home / Self Care | Source: Ambulatory Visit

## 2023-08-07 ENCOUNTER — Ambulatory Visit: Payer: Self-pay

## 2023-08-07 DIAGNOSIS — I2582 Chronic total occlusion of coronary artery: Secondary | ICD-10-CM | POA: Diagnosis not present

## 2023-08-07 DIAGNOSIS — I2510 Atherosclerotic heart disease of native coronary artery without angina pectoris: Secondary | ICD-10-CM | POA: Diagnosis not present

## 2023-08-07 DIAGNOSIS — R0789 Other chest pain: Secondary | ICD-10-CM | POA: Diagnosis not present

## 2023-08-07 DIAGNOSIS — K449 Diaphragmatic hernia without obstruction or gangrene: Secondary | ICD-10-CM | POA: Insufficient documentation

## 2023-08-07 MED ORDER — IOHEXOL 350 MG/ML SOLN
100.0000 mL | Freq: Once | INTRAVENOUS | Status: AC | PRN
  Administered 2023-08-07: 100 mL via INTRAVENOUS

## 2023-08-07 MED ORDER — NITROGLYCERIN 0.4 MG SL SUBL
0.8000 mg | SUBLINGUAL_TABLET | Freq: Once | SUBLINGUAL | Status: AC
Start: 2023-08-07 — End: 2023-08-07
  Administered 2023-08-07: 0.8 mg via SUBLINGUAL

## 2023-08-22 ENCOUNTER — Ambulatory Visit

## 2023-08-22 DIAGNOSIS — R0789 Other chest pain: Secondary | ICD-10-CM | POA: Diagnosis present

## 2023-08-23 DIAGNOSIS — R002 Palpitations: Secondary | ICD-10-CM

## 2023-08-23 LAB — ECHOCARDIOGRAM COMPLETE
AR max vel: 1.87 cm2
AV Area VTI: 2 cm2
AV Area mean vel: 1.99 cm2
AV Mean grad: 3.7 mmHg
AV Peak grad: 8.5 mmHg
Ao pk vel: 1.45 m/s
Area-P 1/2: 5.14 cm2
S' Lateral: 3.3 cm

## 2023-08-28 NOTE — Progress Notes (Signed)
 [facility] PHYSICAL THERAPY [place] [facility] 08/28/2023 Note Type: Treatment Note Progress Reporting Period: Visit 6  Patient Name: Megan [name] Date of Birth:[birth year] Diagnosis:  Encounter Diagnosis  Name Primary?  . Osteoarthritis of right shoulder, unspecified osteoarthritis type Yes   Referring MD:  Jennye Cheron Moose*   Date of Onset of Impairment-07/24/2022 Date PT Care Plan Established or Reviewed-07/24/2023 Date PT Treatment Started-07/24/2023  Visit Count: 6 Plan of Care Effective Date: 07/24/2023 - 09/18/2023      Assessment/Plan:   Assessment Assessment details:    Megan [name] presents to PT with report of continued shoulder pain and demonstrates limited thoracic mobility with significant deficits in thoracic extension and rotation. Reports increased sh pain and knee pain, Had an MRI and shows torn meniscus in Rt knee. Have been wearing the brace since yesterday. Session focussed on SH ROM and strengthening ex as tolerated.  Remained limited due to the aggravated RT knee pain. Patient will benefit from additional PT to continue to address deficits in mobility and strength and stability for reduced pain and improved functional use of UEs and improved quality of life.     Impairments: impaired ADLs, trigger points, pain and decreased strength     Prognosis: good prognosis   Personal Factors/Comorbidities: 3+   Specific Comorbidities: hyperlipidemia, cirrhosis, chronic hepatitis C completed therapy, obesity,, chronic low back pain, prediabetes, PTSD, bipolar disorder, GERD, ruptured diverticulitis, cervical fusion   Examination of Body Systems: musculoskeletal   Clinical Decision Making: low   Positive Prognosis Rationale: age and motivated for treatment. Negative Prognosis Rationale: medical status/condition, ADL performance, endurance and strength.   Clinical Presentation: stable  Therapy Goals     Goals:     STG: 4weeks  Pt. will  verbalize/demonstrate HEP x1 in clinic to demonstrate compliance with home program Pt. will report a pain level of 2/10 to demonstrate improved QOL Pt. will tolerate manual therapy techniques in clinic to improve clinical outcomes  LTG: 8 weeks Pt. will improve shoulder ROM to 160 degree flexion to improve OH reaching Pt. will improve SPADI score to 20% improved to demonstrate improved UE function Pt will be able to lift 3# OH to get back to PLOF   Plan   Therapy options: will be seen for skilled physical therapy services   Planned therapy interventions: 20560, 20561-Dry Needling 1-2, 3+ areas, 97010-Cold Packs/Hot Packs, 97110-Therapeutic Exercises, 97112-Neuromuscular Re-education, 97140-Manual Therapy, 97164-Re-evaluation (PT) and 97750-Physical Performance Test    Frequency: 1x week   Duration in weeks: 8   Education provided to: patient.   Education provided: anatomy, body awareness, HEP and role of therapy in Rehabilitation   Education results: verbalized good understanding and needs further instruction.   Communication/Consultation: n/a.   Next visit plan:       B shoulder ROM as needed UE strengthening Shoulder stability training, trial quadruped or plantigrade Periscapular muscle activation  Thoracic mobility    Total Session Time: 40   Treatment rendered today:     Therapeutic Exercise x 40 minutes  [place]- 3 min forward/backward for warm up Seated sh abduction- 2 x 10 reps, PTB Seated ER with PTB- 2 x 10 reps, PTB Standing sh extension with SPC- 10 reps Seated scapular retraction- 10 reps Wall pushup- 10 reps    Plan details:     Subjective:   History of Present Condition    History of Present Condition/Chief Complaint:  Right shoulder pain Subjective:  Patient reports she is doing ok.  Sh exercises  are helping.  Had an MRI and shows torn meniscus in Rt knee. Have been wearing the brace since yesterday. Shredded on the MRI  Clean it up  laparoscopically. Pain:    Current pain rating:  2   At best pain rating:  2   At worst pain rating:  6 Pain Comments: 5/10 pain end of session,    Location:  Right shoulder   Quality:  Sharp, aching and knife-like   Aggravating factors:  As the day progresses, overhead activity, performance of arm dominant activites and lifting   Progression:  Worsening   Red Flags:  None Precautions/Equipment  Precautions:  None   Current Braces/Orthoses:  None   Equipment Currently Used:  None Current Functional Status:   age appropriate, disturbed sleep, limited exercise, limited lifting and independent   Current Functional Status Additional Comments:  Independent Social Support:    Lives Environment:  One-story house and stairs (4 steps)   Lives with:  Significant other   Hand dominance:  Right   Communication Preference:  Verbal, written and visual Barriers to Learning:  No Barriers Diagnostic Tests:    Comments:  See EMR Treatments:    Previous treatment:  Medication Patient Goals:    Patient/Family goals for therapy:  Decreased pain, increased ROM, increased strength, return to recreational activites, improved sleep, improve independence with dressing and improve overhead reaching   Objective:  Objective  Rounded shoulders and increased thoracic kyphosis   Treatment rendered per above.        Therapeutic Interventions Charges $$ 97110 - Therapeutic Exercise [mins]: 40          I attest that I have reviewed the above information. SignedBETHA Lavenia Blanch, PT 08/28/2023 9:29 AM

## 2023-09-03 ENCOUNTER — Telehealth: Payer: Self-pay

## 2023-09-03 NOTE — Progress Notes (Unsigned)
    GYNECOLOGY PROGRESS NOTE  Subjective:  PCP: Montey Lot, PA-C  Patient ID: Megan [name], female    DOB: 10/17/1980, 43 y.o.   MRN: [id number]  HPI  Patient is a 43 y.o. G9P0020 female who presents for fertility medications to induce ovulation.    {Common ambulatory SmartLinks:19316}  Review of Systems {ros; complete:30496}   Objective:   There were no vitals taken for this visit. There is no height or weight on file to calculate BMI.  General appearance: {general exam:16600} Abdomen: {abdominal exam:16834} Pelvic: {pelvic exam:16852::cervix normal in appearance,external genitalia normal,no adnexal masses or tenderness,no cervical motion tenderness,rectovaginal septum normal,uterus normal size, shape, and consistency,vagina normal without discharge} Extremities: {extremity exam:5109} Neurologic: {neuro exam:17854}   Assessment/Plan:   No diagnosis found.   There are no diagnoses linked to this encounter.     Estil Mangle, DO [facility]

## 2023-09-03 NOTE — Telephone Encounter (Signed)
 Patient is requesting medication to see if she can ovulate. Please advise

## 2023-09-04 ENCOUNTER — Ambulatory Visit: Admitting: Obstetrics

## 2023-09-04 ENCOUNTER — Encounter: Payer: Self-pay | Admitting: Obstetrics

## 2023-09-04 VITALS — BP 122/68 | HR 78 | Ht 69.0 in | Wt 256.4 lb

## 2023-09-04 DIAGNOSIS — Z319 Encounter for procreative management, unspecified: Secondary | ICD-10-CM | POA: Diagnosis not present

## 2023-09-04 NOTE — Patient Instructions (Signed)
 Timed Intercourse for Conception Start taking a daily prenatal vitamin Purchase ovulation predictor test strips Start tracking periods in app or on a calendar First day of period bleeding is Day 1, mark this day. On Day 10, begin having intercourse every other day and do an ovulation test daily, as directed by the brand you purchase.  Once you have a positive ovulation test, have intercourse that day, and the day after. Megan [name] this day in your tracker.  Now it's important to avoid alcohol and other medications, you may be pregnant!  Wait until day 35 and if your period has not arrived, take a pregnancy test.  If your period arrives, you are not pregnant this cycle; mark Day 1, and start again.

## 2023-09-24 ENCOUNTER — Ambulatory Visit

## 2023-09-24 DIAGNOSIS — Z319 Encounter for procreative management, unspecified: Secondary | ICD-10-CM | POA: Diagnosis not present

## 2023-09-28 ENCOUNTER — Other Ambulatory Visit

## 2023-09-28 DIAGNOSIS — Z319 Encounter for procreative management, unspecified: Secondary | ICD-10-CM

## 2023-10-01 ENCOUNTER — Encounter: Attending: Physical Medicine and Rehabilitation | Admitting: Physical Medicine and Rehabilitation

## 2023-10-01 ENCOUNTER — Encounter: Payer: Self-pay | Admitting: Physical Medicine and Rehabilitation

## 2023-10-01 VITALS — BP 124/87 | HR 71 | Ht 69.0 in | Wt 252.8 lb

## 2023-10-01 DIAGNOSIS — M25561 Pain in right knee: Secondary | ICD-10-CM | POA: Diagnosis present

## 2023-10-01 DIAGNOSIS — M25512 Pain in left shoulder: Secondary | ICD-10-CM | POA: Insufficient documentation

## 2023-10-01 DIAGNOSIS — Z5181 Encounter for therapeutic drug level monitoring: Secondary | ICD-10-CM | POA: Insufficient documentation

## 2023-10-01 DIAGNOSIS — Z79891 Long term (current) use of opiate analgesic: Secondary | ICD-10-CM | POA: Diagnosis present

## 2023-10-01 DIAGNOSIS — Z981 Arthrodesis status: Secondary | ICD-10-CM | POA: Diagnosis present

## 2023-10-01 DIAGNOSIS — G8929 Other chronic pain: Secondary | ICD-10-CM | POA: Diagnosis present

## 2023-10-01 DIAGNOSIS — M5126 Other intervertebral disc displacement, lumbar region: Secondary | ICD-10-CM | POA: Insufficient documentation

## 2023-10-01 DIAGNOSIS — M5442 Lumbago with sciatica, left side: Secondary | ICD-10-CM | POA: Insufficient documentation

## 2023-10-01 DIAGNOSIS — G894 Chronic pain syndrome: Secondary | ICD-10-CM | POA: Insufficient documentation

## 2023-10-01 DIAGNOSIS — R109 Unspecified abdominal pain: Secondary | ICD-10-CM | POA: Insufficient documentation

## 2023-10-01 MED ORDER — DULOXETINE HCL 30 MG PO CPEP: ORAL_CAPSULE | ORAL | 3 refills | Status: DC

## 2023-10-01 MED ORDER — PREGABALIN 100 MG PO CAPS: ORAL_CAPSULE | ORAL | 0 refills | Status: AC

## 2023-10-01 NOTE — Patient Instructions (Addendum)
 Today, we talked about interaction of benzodiazepines with opiate medications and risk of respiratory depression, as well as worse pain control long-term. Patient's mood is well-controlled on her current regimen and she is compliant with psychiatry follow ups every 3 months. We elected to start trial of low-dose Buprenorphine 5 mcg/week patch once UDS results are reviewed. I will send this into your pharmacy once results are back; if there is an issue with the results we will contact you.  I am prescribing narcan  as a safety precaution when I prescribe butrans  Given no benefit and severe side effects, we will wean off Lyrica  -reduce to 100 mg twice daily for 1 week, then 100 mg at nighttime for 1 week, then stop.  Start duloxetine  30 mg for 2 weeks, then increase to 60 mg if no side effects.   Stop meloxicam, celebrex . Take Ibuprofen as needed up to 2 tablets at once up to twice daily.  You can also use voltaren gel locally on painful joints up to 4 times daily.  Continue following with your orthopedic office.   Follow-up with my nurse practitioner Fidela in 1 month.  If pain is well-controlled on your current regimen, you will see her every other month and me every 6 months.  If not, we will follow-up more frequently.  Feel free to use MyChart between appointments to discuss any acute issues, making usually get back to within 48 hours.
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
## 2023-10-04 LAB — DRUG TOX MONITOR 1 W/CONF, ORAL FLD
Alprazolam: NEGATIVE ng/mL (ref <0.50)
Aminoclonazepam: NEGATIVE ng/mL (ref <0.50)
Amphetamines: NEGATIVE ng/mL (ref <10)
Barbiturates: NEGATIVE ng/mL (ref <10)
Benzodiazepines: POSITIVE ng/mL — AB (ref <0.50)
Buprenorphine: >25 ng/mL — ABNORMAL HIGH (ref <0.10)
Buprenorphine: POSITIVE ng/mL — AB (ref <0.10)
Chlordiazepoxide: NEGATIVE ng/mL (ref <0.50)
Clonazepam: NEGATIVE ng/mL (ref <0.50)
Cocaine: NEGATIVE ng/mL (ref <5.0)
Diazepam: >25 ng/mL — ABNORMAL HIGH (ref <0.50)
Fentanyl: NEGATIVE ng/mL (ref <0.10)
Flunitrazepam: NEGATIVE ng/mL (ref <0.50)
Flurazepam: NEGATIVE ng/mL (ref <0.50)
Heroin Metabolite: NEGATIVE ng/mL (ref <1.0)
Lorazepam: NEGATIVE ng/mL (ref <0.50)
MARIJUANA: NEGATIVE ng/mL (ref <2.5)
MDMA: NEGATIVE ng/mL (ref <10)
Meprobamate: NEGATIVE ng/mL (ref <2.5)
Methadone: NEGATIVE ng/mL (ref <5.0)
Midazolam: NEGATIVE ng/mL (ref <0.50)
Naloxone: >25 ng/mL — ABNORMAL HIGH (ref <0.25)
Nicotine Metabolite: NEGATIVE ng/mL (ref <5.0)
Norbuprenorphine: 3.39 ng/mL — ABNORMAL HIGH (ref <0.50)
Nordiazepam: >25 ng/mL — ABNORMAL HIGH (ref <0.50)
Opiates: NEGATIVE ng/mL (ref <2.5)
Oxazepam: 3.4 ng/mL — ABNORMAL HIGH (ref <0.50)
Phencyclidine: NEGATIVE ng/mL (ref <10)
Tapentadol: NEGATIVE ng/mL (ref <5.0)
Temazepam: 4.28 ng/mL — ABNORMAL HIGH (ref <0.50)
Tramadol: NEGATIVE ng/mL (ref <5.0)
Triazolam: NEGATIVE ng/mL (ref <0.50)
Zolpidem: NEGATIVE ng/mL (ref <5.0)

## 2023-10-04 LAB — DRUG TOX ALC METAB W/CON, ORAL FLD: Alcohol Metabolite: NEGATIVE ng/mL (ref <25)

## 2023-10-05 LAB — ESTRADIOL: Estradiol: 21 pg/mL

## 2023-10-05 LAB — FOLLICLE STIMULATING HORMONE: FSH: 9.6 m[IU]/mL

## 2023-10-05 LAB — ANTI MULLERIAN HORMONE: ANTI-MULLERIAN HORMONE (AMH): 5.07 ng/mL

## 2023-10-08 ENCOUNTER — Ambulatory Visit

## 2023-10-08 ENCOUNTER — Ambulatory Visit: Payer: Self-pay | Admitting: Obstetrics

## 2023-10-08 VITALS — BP 126/86 | HR 72 | Ht 69.0 in | Wt 251.6 lb

## 2023-10-08 DIAGNOSIS — I2510 Atherosclerotic heart disease of native coronary artery without angina pectoris: Secondary | ICD-10-CM | POA: Diagnosis present

## 2023-10-08 DIAGNOSIS — E66813 Obesity, class 3: Secondary | ICD-10-CM | POA: Diagnosis present

## 2023-10-08 DIAGNOSIS — G8929 Other chronic pain: Secondary | ICD-10-CM | POA: Insufficient documentation

## 2023-10-08 DIAGNOSIS — I10 Essential (primary) hypertension: Secondary | ICD-10-CM | POA: Insufficient documentation

## 2023-10-08 DIAGNOSIS — Z6841 Body Mass Index (BMI) 40.0 and over, adult: Secondary | ICD-10-CM | POA: Diagnosis present

## 2023-10-08 DIAGNOSIS — R109 Unspecified abdominal pain: Secondary | ICD-10-CM | POA: Insufficient documentation

## 2023-10-08 DIAGNOSIS — Z5181 Encounter for therapeutic drug level monitoring: Secondary | ICD-10-CM | POA: Insufficient documentation

## 2023-10-08 DIAGNOSIS — G894 Chronic pain syndrome: Secondary | ICD-10-CM | POA: Insufficient documentation

## 2023-10-08 DIAGNOSIS — E7800 Pure hypercholesterolemia, unspecified: Secondary | ICD-10-CM | POA: Insufficient documentation

## 2023-10-08 DIAGNOSIS — Z981 Arthrodesis status: Secondary | ICD-10-CM | POA: Insufficient documentation

## 2023-10-08 MED ORDER — ATORVASTATIN CALCIUM 80 MG PO TABS: 80.0000 mg | ORAL_TABLET | Freq: Every day | ORAL | 3 refills | Status: AC

## 2023-10-08 NOTE — Assessment & Plan Note (Signed)
 Has been on lipid-lowering therapy with atorvastatin  for many years. With her mild nonobstructive coronary artery disease on cardiac CT would recommend LDL target below 70 mg/dL. In this context titrate up atorvastatin  to 80 mg once daily. Last lipid panel from 05/11/2023 total cholesterol 170, HDL 52, LDL 104, triglycerides 76.

## 2023-10-08 NOTE — Telephone Encounter (Signed)
 Telephone call re: US  and lab results obtained for pt desiring pregnancy. Labs were normal, AMH c/w PCOS, and US  c/w PCOS as well. Pt is on the fence re: pregnancy, would not want a baby if had any genetic or chromosomal issue. Discussed that this is a risk with any pregnancy, and some chromosomal abnormalities increase as we age. Encouraged her to discuss with partner if she really wants to pursue pregnancy, and if so, recommend she consult with [facility] where she can genetically test the embryo prior to implantation if she desires. She states that she most likely will not pursue pregnancy at this time.

## 2023-10-08 NOTE — Assessment & Plan Note (Signed)
 Blood pressures have been well-controlled. Currently not on any blood pressure lowering medications. Advised her to continue monitoring her blood pressures.

## 2023-10-08 NOTE — Assessment & Plan Note (Signed)
 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  Continue atorvastatin  for lipid-lowering, titrate the dose up to 80 mg once daily. Currently not taking aspirin  consistently. Recommended to review with GI service that Megan [name] follows up for cirrhosis with regards to safety of using aspirin  81 mg once daily consistently and if agreeable, start taking aspirin  81 mg once daily for cardiac protection.

## 2023-10-08 NOTE — Assessment & Plan Note (Signed)
 Recommended to continue with dietary modifications and increase activity as tolerated with regular exercise. She has been gradually losing weight over the past year intentionally. Congratulated her about this and encouraged to continue with dietary and lifestyle modifications to target further weight loss.

## 2023-10-08 NOTE — Patient Instructions (Signed)
 Medication Instructions:  Your physician has recommended you make the following change in your medication:   START: Lipitor 80 mg daily  *If you need a refill on your cardiac medications before your next appointment, please call your pharmacy*  Lab Work: Your physician recommends that you return for lab work in:   Labs 1 week before next appointment: Lipids  If you have labs (blood work) drawn today and your tests are completely normal, you will receive your results only by: MyChart Message (if you have MyChart) OR A paper copy in the mail If you have any lab test that is abnormal or we need to change your treatment, we will call you to review the results.  Testing/Procedures: None  Follow-Up: At [facility], you and your health needs are our priority.  As part of our continuing mission to provide you with exceptional heart care, our providers are all part of one team.  This team includes your primary Cardiologist (physician) and Advanced Practice Providers or APPs (Physician Assistants and Nurse Practitioners) who all work together to provide you with the care you need, when you need it.  Your next appointment:   9 month(s)  Provider:   Alean Kobus, MD    We recommend signing up for the patient portal called MyChart.  Sign up information is provided on this After Visit Summary.  MyChart is used to connect with patients for Virtual Visits (Telemedicine).  Patients are able to view lab/test results, encounter notes, upcoming appointments, etc.  Non-urgent messages can be sent to your provider as well.   To learn more about what you can do with MyChart, go to [email or web address].   Other Instructions None

## 2023-10-08 NOTE — Progress Notes (Signed)
 Cardiology Consultation:    Date:  10/08/2023   ID:  Megan [name], DOB [birth year], MRN [id number]  PCP:  Montey Lot, PA-C  Cardiologist:  Alean SAUNDERS Quatavious Rossa, MD   Referring MD: Montey Lot, PA-C   No chief complaint on file.    ASSESSMENT AND PLAN:   Megan [name] 43 year old woman with history of mild nonobstructive coronary artery disease based on cardiac CT coronary imaging 08/07/2023, normal biventricular function and and normal diastolic function on echocardiogram 08/22/2023, hypertension, hyperlipidemia, cirrhosis, chronic hepatitis C completed therapy, obesity, chronic low back pain, prediabetes, PTSD, bipolar disorder, GERD, complicated ruptured diverticulitis in 2020 requiring multiple abdominal surgeries, former smoker quit in 2020, family history of premature coronary artery disease in her father at age 67.  Seen for evaluation of chest pain and workup with cardiac CT, Zio patch and echocardiogram as reviewed below was reassuring.  Continues to do well with good baseline functional status she was happy to find out about the reassuring test results and has continued to keep herself active physically. Has been targeting weight loss and Problem List Items Addressed This Visit     Essential (primary) hypertension   Blood pressures have been well-controlled. Currently not on any blood pressure lowering medications. Advised her to continue monitoring her blood pressures.      Relevant Medications   atorvastatin  (LIPITOR) 80 MG tablet   Other Relevant Orders   Lipid Profile   Hypercholesteremia   Has been on lipid-lowering therapy with atorvastatin  for many years. With her mild nonobstructive coronary artery disease on cardiac CT would recommend LDL target below 70 mg/dL. In this context titrate up atorvastatin  to 80 mg once daily. Last lipid panel from 05/11/2023 total cholesterol 170, HDL 52, LDL 104, triglycerides 76.       Relevant Medications   atorvastatin   (LIPITOR) 80 MG tablet   Obesity   Recommended to continue with dietary modifications and increase activity as tolerated with regular exercise. She has been gradually losing weight over the past year intentionally. Congratulated her about this and encouraged to continue with dietary and lifestyle modifications to target further weight loss.       CAD (coronary artery disease) - Primary   Mild nonobstructive CAD by cardiac CT coronary angiogram 08/07/2023, 25 to 49% disease of mid LAD and ostial RCA, total plaque volume 44 mm cube and calcium  score 16.8.  Continue atorvastatin  for lipid-lowering, titrate the dose up to 80 mg once daily. Currently not taking aspirin  consistently. Recommended to review with GI service that she follows up for cirrhosis with regards to safety of using aspirin  81 mg once daily consistently and if agreeable, start taking aspirin  81 mg once daily for cardiac protection.       Relevant Medications   atorvastatin  (LIPITOR) 80 MG tablet   Other Relevant Orders   Lipid Profile    Return to clinic tentatively for follow-up in 9 to 10 months and will obtain a lipid panel prior to the next visit.   History of Present Illness:    Megan [name] is a 43 y.o. female who is being seen today for follow-up visit. PCP is Montey Lot, PA-C. Last visit with me in the office was 6/03-2023.  Pleasant woman here for the visit by herself.  Has her own cleaning business.  History of hypertension, hyperlipidemia, cirrhosis, chronic hepatitis C completed therapy, obesity, chronic low back pain, prediabetes, PTSD, bipolar disorder, GERD, complicated ruptured diverticulitis in 2020, former smoker quit in 2020, family  history of premature coronary artery disease with father at age 65 sustaining MI.  Previously had cardiac workup with echocardiogram January 2018 noted normal biventricular function and grade 1 diastolic dysfunction. Stress test with nuclear imaging February 2018  showed normal perfusion, low risk study.  With ongoing symptoms of atypical chest pain over the prior 6 months further evaluation cardiac CT coronary angiogram and repeat echocardiogram along with 2-week Zio patch were recommended.  Cardiac CT coronary angiogram 08/07/2023 completed reported mild nonobstructive coronary artery disease with 25 to 49% disease of mid LAD and ostial RCA, mild plaque buildup with calcium  score 16.8 and total plaque volume 44 mm cube.  Extracardiac findings were notable for small calcified mediastinal and hilar lymph nodes felt to be benign requiring no further imaging.  Minimal hiatal hernia like findings noted.  Zio patch over 14 days noted predominantly sinus rhythm with average heart rate 79/min [ranging from 55 to 188 bpm, 1 short run of NSVT lasting 7 beats.  Rare supraventricular and ventricular ectopy burden less than 1%, multiple patient triggered events and diary entries [total 82] mostly correlated with normal heart rate and rhythm and at times sinus tachycardia and isolated supraventricular ectopy.  Echocardiogram 08/22/2023 noted normal biventricular function LVEF 60 to 65%, normal diastolic parameters, trace MR.  Overall good benign workup no major abnormalities findings were reassuring without any significant cardiac causes for her chest pain.  She denies any exertional anginal symptoms at this time.  Still continues to have on and off pain over the upper left part of the chest radiating into the shoulder and the back, appears more musculoskeletal description.  Denies any orthopnea paroxysmal nocturnal dyspnea. No palpitations, lightheadedness, dizziness or syncopal episodes.  Good compliance with her medications. Does not routinely check blood pressures at home.    Past Medical History:  Diagnosis Date   Anxiety 12/23/2018   Bipolar 1 disorder (HCC) 12/23/2018   Body mass index (BMI) of 40.0 to 44.9 in adult (HCC) 05/02/2019   Chest pain, atypical     Chronic hepatitis C virus infection (HCC) 05/02/2019   Cirrhosis (HCC)    Diverticulitis 10/14/2018   Diverticulitis of sigmoid colon 09/29/2019   Diverticulosis    Dyslipidemia    Essential (primary) hypertension 05/02/2019   Last Assessment & Plan:   Formatting of this note might be different from the original.  Mildly elevated blood pressure during today's visit.  No associated symptoms.  Not currently on antihypertensives.  Reviewed DASH diet and instructed to follow up with PCP.     Fatty liver 05/22/2020   Last Assessment & Plan:   Formatting of this note might be different from the original.  Patient with a history of hepatic steatosis on biopsy and risk factors for nonalcohol associated fatty liver disease including hyperlipidemia, hypertension, and obesity.     Patient is aware that increased fat in the liver can cause ongoing scarring and progression of cirrhosis.     Reviewed that there is no t   Gastro-esophageal reflux disease without esophagitis 05/02/2019   GERD (gastroesophageal reflux disease)    Gram-negative bacteremia 05/02/2019   H/O ileostomy    Hepatic cirrhosis (HCC) 05/22/2020   Formatting of this note is different from the original.     Patient has biopsy-proven cirrhosis, well compensated.     Patient underwent 2 significant abdominal surgeries in the last year with no evidence of hepatic decompensation.     Most recent labs show well-preserved liver synthetic function.  Patient will  have labs today to reevaluate.     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   Obesity    Osteoarthritis of right shoulder 07/31/2023   Pain in joint of left shoulder 03/10/2020   PCOS (polycystic ovarian syndrome)    PTSD (post-traumatic stress disorder)    Pyelonephritis 10/10/2013   Sciatica 07/07/2018   Steatosis (HCC)    Stiffness of left shoulder joint 11/23/2020   UTI due to extended-spectrum beta lactamase (ESBL) producing Escherichia coli 05/03/2019   Varicose veins of leg with pain, bilateral 09/17/2020    Past Surgical History:  Procedure Laterality Date   ANTERIOR CERVICAL DECOMP/DISCECTOMY FUSION N/A 08/10/2020   Procedure: Cervical Six-Seven Anterior cervical decompression/discectomy/fusion;  Surgeon: Cheryle Debby LABOR, MD;  Location: [facility] OR;  Service: Neurosurgery;  Laterality: N/A;  Cervical Six-Seven Anterior cervical decompression/discectomy/fusion   BACK SURGERY     COLON SURGERY  09/2018   @ [facility]; and again 12/2018   DILATION AND CURETTAGE OF UTERUS  1999   Elective abortion   FRACTURE SURGERY     left hand   HAND SURGERY Left    HERNIA REPAIR  09/2019   Incisional hernia repair   KNEE ARTHROSCOPY Right    LIVER BIOPSY     LUMBAR LAMINECTOMY/DECOMPRESSION MICRODISCECTOMY N/A 07/15/2018   Procedure: L5-S1 MINIMALLY INVASIVE LUMBAR DISCECTOMY;  Surgeon: Cheryle Debby LABOR, MD;  Location: [facility] OR;  Service: Neurosurgery;  Laterality: N/A;   SHOULDER ARTHROSCOPY WITH LABRAL REPAIR Left 04/20/2020   Procedure: LEFT SHOULDER ARTHROSCOPY WITH LABRAL REPAIR, SUBACROMIAL DEBRIDEMENT;  Surgeon: Marchia Drivers, MD;  Location: [facility] ORS;  Service: Orthopedics;  Laterality: Left;   TONSILLECTOMY     UPPER EXTREMITY VENOGRAPHY Left 08/26/2020   Procedure: UPPER EXTREMITY VENOGRAPHY;  Surgeon: Marea Selinda RAMAN, MD;  Location: [facility] INVASIVE CV LAB;  Service: Cardiovascular;  Laterality: Left;    Current Medications: Current Meds   Medication Sig   atorvastatin  (LIPITOR) 80 MG tablet Take 1 tablet (80 mg total) by mouth daily.   diazepam  (VALIUM ) 5 MG tablet Take 5 mg by mouth daily as needed for anxiety.   DULoxetine  (CYMBALTA ) 30 MG capsule Take 1 capsule (30 mg total) by mouth daily for 14 days, THEN 2 capsules (60 mg total) daily for 16 days.   fluticasone  (FLONASE ) 50 MCG/ACT nasal spray Place 1 spray into both nostrils daily.   nystatin (MYCOSTATIN/NYSTOP) powder Apply 1 Application topically 3 (three) times daily.   omeprazole (PRILOSEC) 20 MG capsule Take 20 mg by mouth daily.    OZEMPIC, 2 MG/DOSE, 8 MG/3ML SOPN Inject 2 mg into the skin once a week.   pregabalin  (LYRICA ) 100 MG capsule Take 1 capsule (100 mg total) by mouth 2 (two) times daily for 7 days, THEN 1 capsule (100 mg total) at bedtime for 7 days.   timolol  (TIMOPTIC ) 0.5 % ophthalmic solution Place 1 drop into both eyes 2 (two) times daily.   tiZANidine (ZANAFLEX) 4 MG tablet Take 2-4 mg by mouth at bedtime as needed for muscle spasms.  zolpidem (AMBIEN) 10 MG tablet Take 10 mg by mouth at bedtime as needed.   [DISCONTINUED] atorvastatin  (LIPITOR) 40 MG tablet Take 40 mg by mouth daily.     Allergies:   Ace inhibitors, Levofloxacin, Lisinopril , Varenicline, Clindamycin , and Penicillins   Social History   Socioeconomic History   Marital status: Single    Spouse name: Not on file   Number of children: 0   Years of education: Not on file   Highest education level: Not on file  Occupational History   Occupation: Bartender  Tobacco Use   Smoking status: Former    Current packs/day: 0.00    Average packs/day: 0.5 packs/day for 15.0 years (7.5 ttl pk-yrs)    Types: Cigarettes    Start date: 03/29/2000    Quit date: 03/30/2015    Years since quitting: 8.5   Smokeless tobacco: Never  Vaping Use   Vaping status: Never Used  Substance and Sexual Activity   Alcohol  use: Not Currently    Comment: social   Drug use: Not Currently    Comment: hx  cocaine use   Sexual activity: Yes    Birth control/protection: None  Other Topics Concern   Not on file  Social History Narrative   ** Merged History Encounter **    room mate   Social Drivers of Health   Financial Resource Strain: High Risk (04/22/2023)   Received from [facility]   Overall Financial Resource Strain (CARDIA)    Difficulty of Paying Living Expenses: Very hard  Food Insecurity: Low Risk  (05/16/2023)   Received from [facility]   Hunger Vital Sign    Within the past 12 months, you worried that your food would run out before you got money to buy more: Never true    Within the past 12 months, the food you bought just didn't last and you didn't have money to get more. : Never true  Recent Concern: Food Insecurity - Food Insecurity Present (04/22/2023)   Received from [facility]   Hunger Vital Sign    Within the past 12 months, you worried that your food would run out before you got the money to buy more.: Often true    Within the past 12 months, the food you bought just didn't last and you didn't have money to get more.: Often true  Transportation Needs: No Transportation Needs (05/16/2023)   Received from [facility]    In the past 12 months, has lack of reliable transportation kept you from medical appointments, meetings, work or from getting things needed for daily living? : No  Physical Activity: Sufficiently Active (04/22/2023)   Received from [facility]   Exercise Vital Sign    On average, how many days per week do you engage in moderate to strenuous exercise (like a brisk walk)?: 7 days    On average, how many minutes do you engage in exercise at this level?: 30 min  Stress: Stress Concern Present (04/22/2023)   Received from [facility] - Occupational Stress Questionnaire    Feeling of Stress : To some extent  Social Connections: Socially Integrated (04/22/2023)   Received from [facility]    Social Network    How would you rate your social network (family, work, friends)?: Good participation with social networks     Family History: The patient's family history includes Breast cancer in her maternal aunt; Cancer - Colon in her maternal grandfather; Colon  cancer in her maternal grandfather; Heart attack in her father and paternal grandfather; High blood pressure in her father and mother. ROS:   Please see the history of present illness.    All 14 point review of systems negative except as described per history of present illness.  EKGs/Labs/Other Studies Reviewed:    The following studies were reviewed today:   EKG:       Recent Labs: 02/16/2023: BUN 8; Creatinine, Ser 0.67; Hemoglobin 12.9; Platelets 222; Potassium 4.3; Sodium 139  Recent Lipid Panel No results found for: CHOL, TRIG, HDL, CHOLHDL, VLDL, LDLCALC, LDLDIRECT  Physical Exam:    VS:  BP 126/86   Pulse 72   Ht 5' 9 (1.753 m)   Wt 251 lb 9.6 oz (114.1 kg)   SpO2 98%   BMI 37.15 kg/m     Wt Readings from Last 3 Encounters:  10/08/23 251 lb 9.6 oz (114.1 kg)  10/01/23 252 lb 12.8 oz (114.7 kg)  09/04/23 256 lb 6.4 oz (116.3 kg)     GENERAL:  Well nourished, well developed in no acute distress NECK: No JVD; No carotid bruits CARDIAC: RRR, S1 and S2 present, no murmurs, no rubs, no gallops CHEST:  Clear to auscultation without rales, wheezing or rhonchi  Extremities: No pitting pedal edema. Pulses bilaterally symmetric with radial 2+ and dorsalis pedis 2+ NEUROLOGIC:  Alert and oriented x 3  Medication Adjustments/Labs and Tests Ordered: Current medicines are reviewed at length with the patient today.  Concerns regarding medicines are outlined above.  Orders Placed This Encounter  Procedures   Lipid Profile   Meds ordered this encounter  Medications   atorvastatin  (LIPITOR) 80 MG tablet    Sig: Take 1 tablet (80 mg total) by mouth daily.    Dispense:  90 tablet    Refill:  3     Signed, Suleima Ohlendorf reddy Jontavius Rabalais, MD, MPH, [facility]. 10/08/2023 3:00 PM    [facility]

## 2023-10-26 ENCOUNTER — Telehealth: Payer: Self-pay | Admitting: Registered Nurse

## 2023-10-26 NOTE — Telephone Encounter (Signed)
 P stated she spoke w someone and she was dismissed about a positive cocaine test in 2016. She needs to speak to someone about getting the papers shredded so she can find aomewhere else to go. Can someone from clinical pls return this call

## 2023-10-26 NOTE — Telephone Encounter (Signed)
 Call Placed to Megan [name] regarding Dr Emeline recommendation.  Call placed to Megan [name], she admits of taking Buprenorphine from her neighbor, also statesher cocaine use was over 20 years ago. I let her know she will be non- opiate management, she states : Since Dr. Emeline will not help her, she will have to go to street drugs. Try to explain Dr Emeline could offer other treatment options, she hung up on this provider.  The above message will be forward to Dr Emeline.

## 2023-10-28 ENCOUNTER — Other Ambulatory Visit: Payer: Self-pay | Admitting: Physical Medicine and Rehabilitation

## 2023-10-29 ENCOUNTER — Encounter: Admitting: Registered Nurse

## 2023-11-19 ENCOUNTER — Other Ambulatory Visit: Payer: Self-pay | Admitting: Nurse Practitioner

## 2023-11-19 DIAGNOSIS — K7469 Other cirrhosis of liver: Secondary | ICD-10-CM

## 2023-11-19 DIAGNOSIS — K760 Fatty (change of) liver, not elsewhere classified: Secondary | ICD-10-CM

## 2023-11-21 LAB — LIPID PANEL
Chol/HDL Ratio: 3.9 ratio (ref 0.0–4.4)
Cholesterol, Total: 181 mg/dL (ref 100–199)
HDL: 46 mg/dL (ref >39)
LDL Chol Calc (NIH): 118 mg/dL — ABNORMAL HIGH (ref 0–99)
Triglycerides: 92 mg/dL (ref 0–149)
VLDL Cholesterol Cal: 17 mg/dL (ref 5–40)

## 2023-12-07 ENCOUNTER — Ambulatory Visit
Admission: RE | Admit: 2023-12-07 | Discharge: 2023-12-07 | Disposition: A | Discharge status: Home / Self Care | Source: Ambulatory Visit | Attending: Nurse Practitioner | Admitting: Nurse Practitioner

## 2023-12-07 DIAGNOSIS — K7469 Other cirrhosis of liver: Secondary | ICD-10-CM

## 2023-12-07 DIAGNOSIS — K760 Fatty (change of) liver, not elsewhere classified: Secondary | ICD-10-CM

## 2024-02-06 ENCOUNTER — Other Ambulatory Visit: Payer: Self-pay | Admitting: Physical Medicine and Rehabilitation

## 2024-07-29 ENCOUNTER — Ambulatory Visit: Admitting: Physician Assistant
